# Patient Record
Sex: Female | Born: 1940 | Race: White | Hispanic: No | Smoking: Former smoker
Health system: Southern US, Community
[De-identification: ages and names within clinical notes are randomized; demographics above are authoritative.]

## PROBLEM LIST (undated history)

## (undated) DIAGNOSIS — H269 Unspecified cataract: Secondary | ICD-10-CM

## (undated) DIAGNOSIS — I10 Essential (primary) hypertension: Secondary | ICD-10-CM

## (undated) DIAGNOSIS — E559 Vitamin D deficiency, unspecified: Secondary | ICD-10-CM

## (undated) DIAGNOSIS — D126 Benign neoplasm of colon, unspecified: Secondary | ICD-10-CM

## (undated) DIAGNOSIS — I4891 Unspecified atrial fibrillation: Secondary | ICD-10-CM

## (undated) DIAGNOSIS — M810 Age-related osteoporosis without current pathological fracture: Secondary | ICD-10-CM

## (undated) DIAGNOSIS — B029 Zoster without complications: Secondary | ICD-10-CM

## (undated) DIAGNOSIS — E785 Hyperlipidemia, unspecified: Secondary | ICD-10-CM

## (undated) HISTORY — PX: EYE SURGERY: SHX253

## (undated) HISTORY — PX: BREAST EXCISIONAL BIOPSY: SUR124

## (undated) HISTORY — DX: Vitamin D deficiency, unspecified: E55.9

## (undated) HISTORY — DX: Essential (primary) hypertension: I10

## (undated) HISTORY — PX: LAPAROSCOPIC LYSIS OF ADHESIONS: SHX5905

## (undated) HISTORY — DX: Age-related osteoporosis without current pathological fracture: M81.0

## (undated) HISTORY — DX: Unspecified cataract: H26.9

## (undated) HISTORY — PX: TUBAL LIGATION: SHX77

## (undated) HISTORY — DX: Zoster without complications: B02.9

## (undated) HISTORY — DX: Benign neoplasm of colon, unspecified: D12.6

## (undated) HISTORY — DX: Hyperlipidemia, unspecified: E78.5

---

## 2005-02-21 DIAGNOSIS — D126 Benign neoplasm of colon, unspecified: Secondary | ICD-10-CM

## 2005-02-21 HISTORY — PX: PARTIAL COLECTOMY: SHX5273

## 2005-02-21 HISTORY — DX: Benign neoplasm of colon, unspecified: D12.6

## 2005-05-12 ENCOUNTER — Other Ambulatory Visit: Admission: RE | Admit: 2005-05-12 | Discharge: 2005-05-12 | Payer: Self-pay | Admitting: Family Medicine

## 2005-05-22 HISTORY — PX: COLONOSCOPY: SHX174

## 2005-05-26 ENCOUNTER — Ambulatory Visit: Payer: Self-pay | Admitting: Gastroenterology

## 2005-06-01 ENCOUNTER — Ambulatory Visit: Payer: Self-pay | Admitting: Gastroenterology

## 2005-06-01 ENCOUNTER — Encounter (INDEPENDENT_AMBULATORY_CARE_PROVIDER_SITE_OTHER): Payer: Self-pay | Admitting: *Deleted

## 2005-06-03 ENCOUNTER — Ambulatory Visit: Payer: Self-pay | Admitting: Internal Medicine

## 2005-06-13 ENCOUNTER — Ambulatory Visit: Payer: Self-pay

## 2005-07-19 ENCOUNTER — Encounter (INDEPENDENT_AMBULATORY_CARE_PROVIDER_SITE_OTHER): Payer: Self-pay | Admitting: *Deleted

## 2005-07-19 ENCOUNTER — Inpatient Hospital Stay (HOSPITAL_COMMUNITY): Admission: RE | Admit: 2005-07-19 | Discharge: 2005-07-22 | Payer: Self-pay | Admitting: Surgery

## 2005-11-30 ENCOUNTER — Ambulatory Visit (HOSPITAL_COMMUNITY): Admission: RE | Admit: 2005-11-30 | Discharge: 2005-11-30 | Payer: Self-pay | Admitting: Ophthalmology

## 2005-12-01 ENCOUNTER — Ambulatory Visit: Payer: Self-pay | Admitting: Gastroenterology

## 2005-12-28 ENCOUNTER — Ambulatory Visit (HOSPITAL_COMMUNITY): Admission: RE | Admit: 2005-12-28 | Discharge: 2005-12-28 | Payer: Self-pay | Admitting: Ophthalmology

## 2006-01-02 ENCOUNTER — Encounter (INDEPENDENT_AMBULATORY_CARE_PROVIDER_SITE_OTHER): Payer: Self-pay | Admitting: Specialist

## 2006-01-02 ENCOUNTER — Ambulatory Visit: Payer: Self-pay | Admitting: Gastroenterology

## 2006-01-02 HISTORY — PX: COLONOSCOPY: SHX174

## 2006-01-10 ENCOUNTER — Ambulatory Visit: Payer: Self-pay | Admitting: Gastroenterology

## 2006-02-09 ENCOUNTER — Ambulatory Visit: Payer: Self-pay | Admitting: Gastroenterology

## 2006-02-09 LAB — CONVERTED CEMR LAB: CEA: 3.4 ng/mL (ref 0.0–5.0)

## 2006-02-21 HISTORY — PX: COLONOSCOPY: SHX174

## 2006-02-21 HISTORY — PX: COLON RESECTION: SHX5231

## 2006-04-10 ENCOUNTER — Ambulatory Visit: Payer: Self-pay | Admitting: Gastroenterology

## 2006-04-19 ENCOUNTER — Ambulatory Visit: Payer: Self-pay | Admitting: Internal Medicine

## 2006-04-27 ENCOUNTER — Encounter (INDEPENDENT_AMBULATORY_CARE_PROVIDER_SITE_OTHER): Payer: Self-pay | Admitting: Specialist

## 2006-04-27 ENCOUNTER — Ambulatory Visit (HOSPITAL_COMMUNITY): Admission: RE | Admit: 2006-04-27 | Discharge: 2006-04-27 | Payer: Self-pay | Admitting: Internal Medicine

## 2006-05-02 ENCOUNTER — Ambulatory Visit: Payer: Self-pay | Admitting: Internal Medicine

## 2006-07-24 ENCOUNTER — Ambulatory Visit (HOSPITAL_COMMUNITY): Admission: RE | Admit: 2006-07-24 | Discharge: 2006-07-24 | Payer: Self-pay | Admitting: Internal Medicine

## 2006-07-24 ENCOUNTER — Encounter: Payer: Self-pay | Admitting: Internal Medicine

## 2006-07-28 ENCOUNTER — Ambulatory Visit: Payer: Self-pay | Admitting: Internal Medicine

## 2006-11-08 ENCOUNTER — Ambulatory Visit: Payer: Self-pay | Admitting: Internal Medicine

## 2006-11-14 ENCOUNTER — Encounter: Payer: Self-pay | Admitting: Internal Medicine

## 2006-11-14 ENCOUNTER — Ambulatory Visit (HOSPITAL_COMMUNITY): Admission: RE | Admit: 2006-11-14 | Discharge: 2006-11-14 | Payer: Self-pay | Admitting: Internal Medicine

## 2006-11-22 ENCOUNTER — Ambulatory Visit: Payer: Self-pay | Admitting: Internal Medicine

## 2006-12-12 ENCOUNTER — Ambulatory Visit: Payer: Self-pay | Admitting: Internal Medicine

## 2007-01-09 ENCOUNTER — Inpatient Hospital Stay (HOSPITAL_COMMUNITY): Admission: RE | Admit: 2007-01-09 | Discharge: 2007-01-13 | Payer: Self-pay | Admitting: Surgery

## 2007-01-09 ENCOUNTER — Encounter (INDEPENDENT_AMBULATORY_CARE_PROVIDER_SITE_OTHER): Payer: Self-pay | Admitting: Surgery

## 2008-01-01 ENCOUNTER — Ambulatory Visit: Payer: Self-pay | Admitting: Internal Medicine

## 2009-11-05 ENCOUNTER — Encounter (INDEPENDENT_AMBULATORY_CARE_PROVIDER_SITE_OTHER): Payer: Self-pay | Admitting: *Deleted

## 2010-03-23 NOTE — Letter (Signed)
Summary: Colonoscopy Letter  [facility]  [address] [place], [place] [address]   Phone: [phone]  Fax: [phone]      November 05, 2009 MRN: [id number]   Robin [name] [address] [place], [place]  [address]   Dear Ms. NICOTRA,   According to your medical record, it is time for you to schedule a Colonoscopy. The American Cancer Society recommends this procedure as a method to detect early colon cancer. Patients with a family history of colon cancer, or a personal history of colon polyps or inflammatory bowel disease are at increased risk.  This letter has beeen generated based on the recommendations made at the time of your procedure. If you feel that in your particular situation this may no longer apply, please contact our office.  Please call our office at [phone] to schedule this appointment or to update your records at your earliest convenience.  Thank you for cooperating with [place] to provide you with the very best care possible.   Sincerely,   Vania Rea. Jarold Motto, M.D.   [facility] [phone]

## 2010-07-06 NOTE — Assessment & Plan Note (Signed)
[facility]                            CARDIOLOGY OFFICE NOTE   Robin [name], Robin [name]                      MRN:          [id number]  DATE:01/01/2008                            DOB:          November 23, 1940    PRIMARY CARE PHYSICIAN:  Robin Peat, MD   INTERVAL HISTORY:  Robin [name] is a very pleasant 70 year old woman with  known history of coronary artery disease.  She has a history of anxiety  and hyperlipidemia as well as colon polyps.  She also has a remote  history of chest pain.  She had Myoview back in April 2007, which showed  an EF of 71% and no ischemia.  We saw her back in October of last year  for preop clearance prior to her colon resection.  At that time, she was  very active and was low risk from a surgical standpoint.   She returns today for followup.  She has done very well with her  surgeries without any cardiac complications.  She continues to walk  several days a week for almost 2 hours at a time without any chest pain  or dyspnea.   CURRENT MEDICATIONS:  1. Zetia 10 a day.  2. Simvastatin 80 a day.  3. Iron.  4. Aspirin 81 a day.  5. Vitamin D.  6. Caltrate.  7. Forteo for her bones.   PHYSICAL EXAMINATION:  GENERAL:  She is well-appearing, no acute  distress, ambulates in the clinic without respiratory difficulty.  VITAL SIGNS:  Blood pressure is 124/78, heart rate 62, weight is 114.  HEENT is normal.  NECK:  Supple.  There is no JVD.  Carotids are 2+ bilaterally without  any bruits.  There is no lymphadenopathy or thyromegaly.  CARDIAC:  PMI is normal.  She is regular rate and rhythm.  No murmurs,  rubs or gallops.  LUNGS:  Clear.  ABDOMEN:  Soft, nontender,  nondistended, no hepatosplenomegaly, no bruits, no masses.  Good bowel  sounds.  EXTREMITIES:  Warm with no cyanosis, clubbing, or edema.  SKIN:  No rash.  NEURO:  Alert and oriented x3.  Cranial nerves II through XII are  intact.  Moves all 4 extremities without  difficulty.  Affect is  pleasant.   EKG shows sinus arrhythmia with first-degree AV block at a PR interval  of 216 milliseconds.  No ST-T wave abnormalities, heart rate 62.   ASSESSMENT AND PLAN:  1. History of chest pain, this is resolved.  She has excellent      functional capacity with a normal Myoview.  She does not need      further cardiac followup at this time.  2. Hyperlipidemia.  This is followed by primary care physician.  Goal      LDL is less than 130.  However, optimally would be under 100.   DISPOSITION:  I will see her back on p.r.n. basis.     Robin Buckles. Bensimhon, MD  Electronically Signed    DRB/MedQ  DD: 01/01/2008  DT: 01/02/2008  Job #: [id number]   cc:   Robin [name], M.D.

## 2010-07-06 NOTE — Op Note (Signed)
NAMEABBYE, LAO               ACCOUNT NO.:  [id number]   MEDICAL RECORD NO.:  [id number]          PATIENT TYPE:  INP   LOCATION:  5731                         FACILITY:  [facility]   PHYSICIAN:  Wilmon Arms. Corliss Skains, M.D. 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   PROCEDURE PERFORMED:  Transverse colectomy.   SURGEON:  Wilmon Arms. Corliss Skains, M.D., FACS   ASSISTANT:  Currie Paris, M.D.   ANESTHESIA:  General endotracheal.   INDICATIONS:  The patient is a very pleasant 70 year old female who  underwent a laparoscopic hand assisted right hemicolectomy in May 2007  for tubulovillous adenomas.  The patient did extremely well.  At the  time of her resection, her margins were clear.  The patient has had  surveillance colonoscopy since that time by Dr. Leone Payor.  This showed a  new tubulovillous adenoma at the ileocolic anastomosis.  She is now  referred for resection of this area.   DESCRIPTION OF PROCEDURE:  The patient was brought to the operating room  and placed in the supine position on the operating table.  After an  adequate level of general anesthesia was obtained, a Foley catheter was  placed under sterile technique.  The patient's abdomen was prepped with  Betadine and draped in a sterile fashion.  A vertical midline incision  was made above the umbilicus.  We carried this down a couple of inches  below the umbilicus.  Dissection was carried down to the fascia which  was opened with cautery in the midline.  We entered the peritoneal  cavity.  The patient had some fairly dense omental adhesions to the  under surface of the previous lower midline incision.  We extended our  incision down and took the adhesions down.  We were able to identify the  transverse colon.  We followed this to the right towards the  anastomosis.  The small bowel seemed firmly adhered down into the right  lower quadrant.  It took [place] about 20 minutes of dissection to free up  the small bowel from the right lower quadrant.  Once this was  accomplished, we were able to visualize the entire anastomosis.  We  dissected this free with blunt dissection from the posterior lying  duodenum.  The terminal ileum as well as the transverse colon were then  divided with GIA staplers.  The LigaSure device was used to divide the  mesentery.  The specimen was then opened on the back table.  We were  able to identify the polyp at the previous anastomosis.  We thoroughly  irrigated the abdomen.  The anastomosis was then created with another  firing of the GIA-75 stapler.  The enterotomy was closed with a TA-60  stapler.  The mesenteric defect was closed with 2-0 silk sutures.  The  abdomen was thoroughly irrigated with saline.  We placed Seprafilm in  the right lower quadrant where  the patient previously had a lot of  adhesions.  We also placed some pieces of Seprafilm from the anterior  surface of the omentum just  behind the midline closure.  The fascia was then closed with double  stranded #1 PDS suture.  The subcutaneous tissues were irrigated. The  staples were used to close the skin.  The patient was extubated and  brought to the recovery room in stable condition.  All sponge,  instrument, and needle counts were correct.      Wilmon Arms. Tsuei, M.D.  Electronically Signed     MKT/MEDQ  D:  01/09/2007  T:  01/09/2007  Job:  [id number]   cc:   Iva Boop, MD,FACG

## 2010-07-06 NOTE — Assessment & Plan Note (Signed)
[facility] HEALTHCARE                            CARDIOLOGY OFFICE NOTE   Robin [name], Robin [name]                      MRN:          [id number]  DATE:12/12/2006                            DOB:          06-Jul-1940    REFERRING PHYSICIAN:  Wilmon Arms. Tsuei, M.D.   PRIMARY:  Lindaann Pascal, PA, at [facility].   GI DOCTOR:  Dr. Sheryn Bison.   REASON FOR CONSULTATION:  Preoperative cardiac risk stratification.   HISTORY OF PRESENT ILLNESS:  Robin [name] is a pleasant 70 year old woman  with no known history of coronary artery disease.  She does have a  history of anxiety and hyperlipidemia.  She was evaluated for chest pain  back in April 2007, at which time she underwent a Myoview, which showed  an ejection fraction of 71% and no evidence of ischemia on perfusion  imaging or exercise EKG.  Since that time, she has done very well.  She  denies any significant chest pain or shortness of breath.  She says she  walks 2 to 2-1/2 hours a day without any problem.  She has not had any  heart failure.  No palpitations.  No syncope.  No pre-syncope.  No focal  neurologic problems.   She was recently found to have large tubulovillous adenoma in her  transverse colon and is pending resection.  We were, thus, asked to  provide cardiac risk factor stratification prior to surgery.   REVIEW OF SYSTEMS:  As per the HPI and problem list.  Otherwise, all  systems negative.   PAST MEDICAL HISTORY:  1. Chest pain.      a.     Normal Myoview in April 2007.  2. Anxiety.  3. Hyperlipidemia.  4. Colonic tubulovillous adenoma.   CURRENT MEDICATIONS:  1. Aspirin 325.  2. Caltrate.  3. Actonel.  4. Zetia 10 a day.  5. Simvastatin 80 a day.  6. Iron tablets.   ALLERGIES:  No known drug allergies.   SOCIAL HISTORY:  She lives in [place].  She is widowed.  She has 6  children.  Denies any tobacco or alcohol.   FAMILY HISTORY:  She is 1 of 9 children.   Mother is alive and well at  age 58.  She had coronary artery disease and bypass surgery at 62.  Father died at age 83 due to COPD.  Three sisters, one who has an  irregular heartbeat, but no coronary artery disease.  Five brothers with  no coronary artery disease.   PHYSICAL EXAM:  She is well-appearing in no acute distress.  She  ambulates around the clinic without any respiratory difficulty.  Blood pressure 118/76, heart rate 63, weight 116.  HEENT:  Normal.  NECK:  Supple.  There is no JVD.  Carotids are 2+ bilaterally without  any bruits.  There is no lymphadenopathy or thyromegaly.  CARDIAC:  PMI is normal.  Regular rate and rhythm.  No murmurs, rubs, or  gallops.  LUNGS:  Clear.  ABDOMEN:  Soft, nontender, nondistended.  There is no  hepatosplenomegaly.  No bruits.  No  masses.  Good bowel sounds.  EXTREMITIES:  Warm with no cyanosis, clubbing, or edema.  Distal pulses  are strong.  NEUROLOGIC:  She is alert and oriented x3.  Cranial nerves 2-12 are  intact.  Moves all 4 extremities without difficulty.  Affect is  pleasant.   EKG shows sinus rhythm with a first degree AV block at 216 ms.  There  are no significant ST-T wave abnormalities.   ASSESSMENT AND PLAN:  1. Preoperative cardiac risk stratification.  Given her functional      capacity and recent stress testing, she is at low risk for      perioperative cardiac complications.  Proceed with surgery without      any further cardiac testing.  2. Hyperlipidemia.  Continue Zetia and simvastatin.  This is followed      by her primary care physician.  3. Chest pain.  This is resolved.  Previous stress test was normal.   DISPOSITION:  She can follow up on a p.r.n. basis.     Bevelyn Buckles. Bensimhon, MD  Electronically Signed    DRB/MedQ  DD: 12/12/2006  DT: 12/13/2006  Job #: [id number]   cc:   Wilmon Arms. Tsuei, M.D.  Vania Rea. Jarold Motto, MD, Caleen Essex, FAGA

## 2010-07-09 NOTE — Assessment & Plan Note (Signed)
[facility]                           GASTROENTEROLOGY OFFICE NOTE   Robin [name], Robin [name]                      MRN:          [id number]  DATE:12/01/2005                            DOB:          02-15-1941    HISTORY OF PRESENT ILLNESS:  Robin [name] is a 70 year old white female who  had Guaiac positive stools and underwent colonoscopy in May of this past  year. She was found to have a large villous adenoma in the right colon at  the area of the hepatic flexure. There were also multiple right colon  polyps. She underwent right hemicolectomies by Dr. Corliss Skains and has done well  since that time.   However, apparently she has had Guaiac positive stools in her primary care  physician's office and has had some mild progressive anemia. She was seen by  Dr. Lorin Picket long. She really denies any GI complaints or general medical  problems otherwise.  She says her appetite is good and her weight is stable.  She is having regular bowel movements and has not notice hematochezia or  melena.  She has undergone cardiac evaluation in our office because of  atypical chest pain and hyperlipidemia, and chronic anxiety syndrome.   CURRENT MEDICATIONS:  1. Simvastatin 40 mg daily.  2. Aspirin 325 mg daily.  3. Caltrate two daily.  4. Ciprofloxacin eye drops.  5. Ophthalmic solution eye drops for cataract surgery.   PHYSICAL EXAMINATION:  GENERAL:  She is a thin, elderly chronically ill-  appearing white female. She weighs 111 pounds which is her normal weight.  VITAL SIGNS:  Blood pressure is 140/70. Pulse 84 and regular.  ABDOMEN:  Exam showed no organomegaly, masses or tenderness.  RECTAL EXAM:  Inspection of the rectum was unremarkable. Rectal exam showed  no rectal masses or tenderness with normal colored stool that is markedly  Guaiac positive.   ASSESSMENT:  I am obviously concerned about Robin [name]'s continued Guaiac  positive stools despite her hemicolectomy. She  did have other colon polyps  that were excised at the time of her colonoscopy.  Review of the surgical  pathology shows no evidence of malignancy. As mentioned above, she is on  aspirin therapy but denies dyspepsia or reflux symptoms.   RECOMMENDATIONS:  1. I have started p.o. iron therapy.  2. I have started her on daily proton pump inhibitor therapy      prophylactically as she is on salicylates.  3. Outpatient endoscopy and colonoscopy exam.  4. Continue other medications as per primary care physician, Dr. Lindaann Pascal.       Vania Rea. Jarold Motto, MD, Clementeen Graham, [place]      DRP/MedQ  DD:  12/02/2005  DT:  12/03/2005  Job #:  [id number]   cc:   Wilmon Arms. Tsuei, M.D.  Scott Long

## 2010-07-09 NOTE — Discharge Summary (Signed)
NAMECHRISTELLA, APP               ACCOUNT NO.:  [id number]   MEDICAL RECORD NO.:  [id number]          PATIENT TYPE:  INP   LOCATION:  5710                         FACILITY:  [facility]   PHYSICIAN:  Wilmon Arms. Corliss Skains, M.D. 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   BRIEF HISTORY:  The patient is a 70 year old female who is 18 months  postop from a laparoscopic hand-assisted right hemicolectomy in May of  2007 for a tubulovillous adenoma.  At the time of her resection, her  margins were clear for tumor.  Since that time, she has had surveillance  colonoscopy.  This showed a new tubulovillous adenoma growing at the  ileocolic anastomosis.  Dr. Leone Payor was unable to resect this via  colonoscope.  She is now referred for resection of this area.   HOSPITAL COURSE:  The patient was admitted to the hospital after a bowel  prep.  She underwent an open transverse colectomy.  She had a stapled  side-to-side anastomosis.  The patient did well postoperatively.  She  had been started on Entereg preoperatively.  She had flatus on postop  day #2.  Her bowel function returned very quickly.  Her diet was  advanced.  The pathology confirmed only tubulovillous adenoma with no  sign of invasive cancer.  On postop day #4, the patient was discharged.  She was tolerating a regular diet and having bowel movements.  Her  wounds were all healing well.   DISCHARGE INSTRUCTIONS:  Follow up with Dr. Corliss Skains next week for staple  removal.  She was given p.r.n. Percocet.  She should refrain from any  heavy lifting.  She may take a regular diet.      Wilmon Arms. Tsuei, M.D.  Electronically Signed     MKT/MEDQ  D:  02/04/2007  T:  02/05/2007  Job:   [id number]

## 2010-07-09 NOTE — Op Note (Signed)
Robin [name], Robin [name]               ACCOUNT NO.:  [id number]   MEDICAL RECORD NO.:  [id number]          PATIENT TYPE:  AMB   LOCATION:  DAY                           FACILITY:  [facility]   PHYSICIAN:  Susanne Greenhouse, MD       DATE OF BIRTH:  1940/08/27   DATE OF PROCEDURE:  11/30/2005  DATE OF DISCHARGE:                                 OPERATIVE REPORT   PREOPERATIVE DIAGNOSIS:  Nuclear cataract, right eye.   POSTOPERATIVE DIAGNOSIS:  Nuclear cataract, right eye.   OPERATION PERFORMED:  Phacoemulsification posterior chamber intraocular lens  implantation, right eye.   SURGEON:  Gemma Payor, MD   ANESTHESIA:  Topical with monitored anesthesia care.   OPERATIVE SUMMARY:  In the preoperative area, dilating drops and 2% viscus  lidocaine were placed into the right eye.  The patient was then brought to  the operating room where the eye was prepped and draped.  A super-sharp  blade was used to make a paracentesis port at the surgeon's 2 o'clock  position.  The anterior chamber was filled with 1% nonpreserved lidocaine  solution followed by Amvisc Plus.  A 2.85 mm keratome blade was used to make  a clear corneal incision at the superotemporal limbus.  A cystotome needle  __________ forceps were used to create a continuous tear capsulotomy.  Hydrodissection was performed using balanced salt solution on a fine  cannula.  The lens nucleus was then removed using phacoemulsification with a  quadrant cracking technique.  Residual cortex was removed with irrigation  and aspiration.  The capsular bag and anterior chamber were refilled with  Amvisc Plus.  A posterior chamber intraocular lens was placed in the  capsular bag using a Monarch Lens Injecting System.  The Amvisc Plus was  then removed from the capsular bag and anterior chamber with irrigation and  aspiration.  Stromal hydration of the main incision and paracentesis ports  was performed with balanced salt solution and a fine cannula.  The wound  was  tested for a leak which was negative.  There were no operative  complications.  The patient tolerated the procedure well and was returned to  the recovery area in satisfactory condition.  Prosthetic device is an Alcon  AcrySof Posterior Chamber Intraocular Lens, model SN60WF, power of 22.0,  serial number [id number].           ______________________________  Susanne Greenhouse, MD     KEH/MEDQ  D:  11/30/2005  T:  12/01/2005  Job:  [id number]

## 2010-07-09 NOTE — Op Note (Signed)
NAMECLEOPHA, INDELICATO               ACCOUNT NO.:  [id number]   MEDICAL RECORD NO.:  [id number]          PATIENT TYPE:  INP   LOCATION:  X009                         [facility]ACILITY:  [facility]   PHYSICIAN:  Wilmon Arms. Corliss Skains, M.D. DATE OF BIRTH:  07/03/1940   DATE OF PROCEDURE:  07/19/2005  DATE OF DISCHARGE:                                 OPERATIVE REPORT   PREOPERATIVE DIAGNOSIS:  Tubulovillous adenoma, right colon.   POSTOPERATIVE DIAGNOSIS:  Tubulovillous adenoma, right colon.   PROCEDURE PERFORMED:  Laparoscopic assisted right hemicolectomy.   SURGEON:  Wilmon Arms. Corliss Skains, M.D.   ASSISTANTSharlet Salina T. Hoxworth, M.D.   ANESTHESIA:  General endotracheal.   INDICATIONS:  The patient is a 70 year old female who recently underwent a  routine physical examination.  Hemoccult cards were positive for occult  blood.  Dr. Jarold Motto then performed a colonoscopy on 06/01/2005.  This  showed what was described as a 5 cm circumferential fungating tumor at the  hepatic flexure.  Biopsy returned a diagnosis of tubulovillous adenoma.  The  patient had multiple other polyps throughout the ascending colon.  These  were not biopsied.  The patient was then referred for surgical evaluation.  We recommended a right hemicolectomy.  We will perform this using  laparoscopic method to hopefully shorten her hospital stay and discomfort.   DESCRIPTION OF PROCEDURE:  The patient brought to the operating placed in  supine position on operating table.  After adequate level of general  endotracheal anesthesia was obtained, a Foley catheter was placed under  sterile technique.  A time-out was then taken assure proper patient, proper  procedure.  A 6 cm incision was made beginning just below the umbilicus down  through the midline.  Dissection was carried down through the subcutaneous  tissues using Bovie cautery.  The fascia was opened along the linea alba.  The peritoneal cavity was bluntly entered.  There were no  adhesions of the  undersurface of the abdominal wall.  A small lap disk was then inserted.  My  left hand was then inserted through the iris of the lap disk.  With my left  hand protecting the viscera, two 5 mm ports were placed in the upper  abdomen.  One was located just inferior to the xiphoid process.  The other  was in the left upper quadrant.  Pneumoperitoneum was obtained by  insufflating CO2 maintaining maximal pressure 15 mmHg.  A 5 mm laparoscope  was inserted through the subxiphoid port.  The transverse and ascending  colon were then palpated.  Several soft masses were palpated near the  hepatic flexure.  The right colon was then mobilized with the Harmonic  scalpel and finger dissection.  We began at the terminal ileum and carried  our dissection superiorly.  This dissection continued around the hepatic  flexure along the transverse colon.  Once we had mobilized to the mid  transverse colon, pneumoperitoneum was then released.  The lap disk was  opened completely and the ascending colon was exteriorized.  We marked the  palpable mass with a silk suture.  The transverse colon  was transected with  a GIA 55 stapler just to the right of the middle colic artery.  The LigaSure  device was used take down the mesentery down to the terminal ileum.  Terminal ileum was divided approximately 10 cm from the ileocecal valve with  an additional firing of the GIA stapler.  A side-to-side staple anastomosis  was then created with a third firing of the GIA stapler.  The enterotomy was  closed with a TA 60 stapler.  A reinforcing suture of 2-0 silk was placed at  the crotch of the anastomosis.  The mesenteric defect closed with  interrupted figure-of-eight 2-0 silk sutures.  The specimen was passed off  the field.  The anastomosis was then placed back in the abdominal cavity.  We then reinspected the right pericolic gutter.  A small peritoneal vessel  was bleeding.  This was controlled with the  Harmonic scalpel.  The right  pericolic gutter was then again thoroughly irrigated.  No further bleeding  was noted.  The anastomosis was noted to be located in the right upper  quadrant.  The liver and gallbladder appeared to be normal.  Pneumoperitoneum was then released and the ports were removed.  The lap disk  was also removed.  The fascia was closed with a #1 PDS suture in running  fashion.  A 4-0 Monocryl was used to close skin in subcuticular fashion.  Steri-Strips and clean dressings were applied.  The patient was then  extubated and brought to recovery room in stable condition.  All sponge,  instrument and needle counts correct.      Wilmon Arms. Tsuei, M.D.  Electronically Signed     MKT/MEDQ  D:  07/19/2005  T:  07/19/2005  Job:  [id number]   cc:   Vania Rea. Jarold Motto, M.D. [facility]  [address]  [place]  [place] [address]

## 2010-07-09 NOTE — Discharge Summary (Signed)
[name], [name]               ACCOUNT NO.:  [id number]   MEDICAL RECORD NO.:  [id number]          PATIENT TYPE:  INP   LOCATION:  1517                         FACILITY:  [facility]   PHYSICIAN:  Wilmon Arms. Corliss Skains, M.D. DATE OF BIRTH:  May 25, 1940   DATE OF ADMISSION:  07/19/2005  DATE OF DISCHARGE:  07/22/2005                                 DISCHARGE SUMMARY   ADMISSION DIAGNOSIS:  Tubulovillous adenoma of the right colon.   DISCHARGE DIAGNOSIS:  Tubulovillous adenoma of the right colon.   PROCEDURES PERFORMED:  A laparoscopic hand-assisted right hemicolectomy on  Jul 19, 2005.   [name] is a 70 year old female in reasonably good health who presented  with positive fecal occult blood.  Dr. Jarold Motto performed a colonoscopy on  June 01, 2005 which showed a tumor of the hepatic flexure.  This was  biopsied and was shown to be tubulovillous adenoma.  Patient also has other  polyps throughout the ascending colon.  Patient was then referred for  surgical evaluation.  She was sent for cardiac clearance due to her cardiac  arrhythmia.  Once she was cleared she was scheduled for a laparoscopic hand-  assisted right hemicolectomy.  This was performed on Jul 19, 2005 after a  full bowel prep at home.  The patient tolerated the procedure well.  She had  bowel sounds on postoperative day #1 and was maintained on clear liquids.  Her diet was advanced and the patient had a bowel movement on postoperative  day #2.  She is being discharged home on postoperative day #3 on a regular  diet.  She is taking Percocet p.r.n. for pain.   DISCHARGE INSTRUCTIONS:  The patient is to follow up in the two to three  weeks.  She was given a prescription for Percocet.  She should resume all of  her preoperative medications.  We discussed her final pathology report prior  to her leaving the hospital.  She had a total of six polyps two of which  were tubulovillous adenoma.  The remainder were benign adenomatous  polyps.  There was no evidence of invasive cancer.      Wilmon Arms. Tsuei, M.D.  Electronically Signed     MKT/MEDQ  D:  07/22/2005  T:  07/22/2005  Job:  [id number]   cc:   Vania Rea. Jarold Motto, M.D. [facility]  [address]  [place]  [place] [address]

## 2010-07-09 NOTE — Assessment & Plan Note (Signed)
[facility]                           GASTROENTEROLOGY OFFICE NOTE   OLINA, MELFI                      MRN:          [id number]  DATE:01/10/2006                            DOB:          December 12, 1940    Because of her anemia and black positive stools, Mrs. Gaccione underwent  repeat colonoscopy on January 02, 2006.  This showed a large spreading  villous adenoma, several centimeters distal to her ileocolonic anastomosis.  There were also multiple polyps around her anastomosis of different size.  This large polyp was removed as best as possible in a piece meal manner and  tissue was sent to pathology for exam, which was reviewed and shows a  tubulovillous adenoma that did show focal igh grade dysplasia.  There was no  invasive carcinoma noted.  Review of her pathology from her surgery showed  tubulovillous adenomatous tissue without high grade dysplasia on those  specimens.   Since being on iron therapy, Yerania's hemoglobin has apparently risen to  11.8.  She denies any GI symptoms at this time.   She weighs 108 pounds.  Her blood pressure is 102/70.  Pulse was 68 and  regular.   General physical exam was not repeated at this time.   ASSESSMENT:  Mrs. Ziegler has a continued large villous adenoma at her  surgical site that was not resected at the time of her recent surgery.  This  probably should have been identified by [place] tattooing.  In any case,  we are now in a quandary as how to treat her since she does have some local  high grade dysplasia, may well have carcinoma on the base of this polyp.   RECOMMENDATIONS:  After talking with her and her sister, we will repeat her  colonoscopy in 3 months time at the hospital so that we could perform ERBE  laser therapy if indicated.  Of course at that time we will get further  tissue for pathologic exam and make a decision as to whether or not repeat  surgery is indicated, although she is  really not interested in pursuing this  course at this time.  I just her by the lab today to check liver profile and  a CEA level.  She is to continue on all of her other medications briefly  outlined on her chart, and she can restart her aspirin at this time.     Vania Rea. Jarold Motto, MD, Caleen Essex, FAGA  Electronically Signed    DRP/MedQ  DD: 01/10/2006  DT: 01/10/2006  Job #: [id number]   cc:   Wilmon Arms. Tsuei, M.D.  Lorin Picket, M.D. [facility]

## 2010-11-30 LAB — COMPREHENSIVE METABOLIC PANEL
ALT: 16
Alkaline Phosphatase: 67
BUN: 9
Calcium: 9.6
Creatinine, Ser: 0.75
Potassium: 4.2
Total Bilirubin: 0.9

## 2010-11-30 LAB — CBC
Hemoglobin: 12.1
MCHC: 33.4
MCV: 85.9
RBC: 4.2
RDW: 14.9
WBC: 7.1

## 2010-11-30 LAB — BASIC METABOLIC PANEL
BUN: 9
Calcium: 8.4
Chloride: 102
Creatinine, Ser: 0.91
GFR calc Af Amer: >60
Glucose, Bld: 165 — ABNORMAL HIGH
Potassium: 5.1

## 2010-11-30 LAB — DIFFERENTIAL
Basophils Absolute: 0
Eosinophils Relative: 1
Lymphs Abs: 1.7
Monocytes Absolute: 0.5
Monocytes Relative: 8
Neutro Abs: 3.2

## 2011-04-15 DIAGNOSIS — E559 Vitamin D deficiency, unspecified: Secondary | ICD-10-CM | POA: Diagnosis not present

## 2011-04-15 DIAGNOSIS — I10 Essential (primary) hypertension: Secondary | ICD-10-CM | POA: Diagnosis not present

## 2011-04-15 DIAGNOSIS — E039 Hypothyroidism, unspecified: Secondary | ICD-10-CM | POA: Diagnosis not present

## 2011-04-15 DIAGNOSIS — E782 Mixed hyperlipidemia: Secondary | ICD-10-CM | POA: Diagnosis not present

## 2011-06-16 DIAGNOSIS — R1033 Periumbilical pain: Secondary | ICD-10-CM | POA: Diagnosis not present

## 2011-06-16 DIAGNOSIS — R072 Precordial pain: Secondary | ICD-10-CM | POA: Diagnosis not present

## 2011-10-19 DIAGNOSIS — M810 Age-related osteoporosis without current pathological fracture: Secondary | ICD-10-CM | POA: Diagnosis not present

## 2011-11-08 ENCOUNTER — Encounter: Payer: Self-pay | Admitting: Gastroenterology

## 2011-12-02 DIAGNOSIS — Z23 Encounter for immunization: Secondary | ICD-10-CM | POA: Diagnosis not present

## 2012-01-25 DIAGNOSIS — H524 Presbyopia: Secondary | ICD-10-CM | POA: Diagnosis not present

## 2012-01-25 DIAGNOSIS — H52229 Regular astigmatism, unspecified eye: Secondary | ICD-10-CM | POA: Diagnosis not present

## 2012-01-25 DIAGNOSIS — H5200 Hypermetropia, unspecified eye: Secondary | ICD-10-CM | POA: Diagnosis not present

## 2012-01-25 DIAGNOSIS — H353190 Nonexudative age-related macular degeneration, unspecified eye, stage unspecified: Secondary | ICD-10-CM | POA: Diagnosis not present

## 2012-01-26 DIAGNOSIS — Z1231 Encounter for screening mammogram for malignant neoplasm of breast: Secondary | ICD-10-CM | POA: Diagnosis not present

## 2012-04-13 DIAGNOSIS — R7989 Other specified abnormal findings of blood chemistry: Secondary | ICD-10-CM | POA: Diagnosis not present

## 2012-04-13 DIAGNOSIS — E039 Hypothyroidism, unspecified: Secondary | ICD-10-CM | POA: Diagnosis not present

## 2012-04-13 DIAGNOSIS — I10 Essential (primary) hypertension: Secondary | ICD-10-CM | POA: Diagnosis not present

## 2012-04-13 DIAGNOSIS — E559 Vitamin D deficiency, unspecified: Secondary | ICD-10-CM | POA: Diagnosis not present

## 2012-04-13 DIAGNOSIS — E785 Hyperlipidemia, unspecified: Secondary | ICD-10-CM | POA: Diagnosis not present

## 2012-05-17 ENCOUNTER — Encounter: Payer: Self-pay | Admitting: *Deleted

## 2012-07-03 DIAGNOSIS — L259 Unspecified contact dermatitis, unspecified cause: Secondary | ICD-10-CM | POA: Diagnosis not present

## 2012-10-12 ENCOUNTER — Ambulatory Visit: Payer: Self-pay | Admitting: General Practice

## 2012-10-31 ENCOUNTER — Ambulatory Visit (INDEPENDENT_AMBULATORY_CARE_PROVIDER_SITE_OTHER): Payer: Medicare Other | Admitting: Pharmacist

## 2012-10-31 ENCOUNTER — Telehealth: Payer: Self-pay | Admitting: Pharmacist

## 2012-10-31 VITALS — BP 122/84 | HR 85 | Ht 62.0 in | Wt 114.0 lb

## 2012-10-31 DIAGNOSIS — E782 Mixed hyperlipidemia: Secondary | ICD-10-CM | POA: Insufficient documentation

## 2012-10-31 DIAGNOSIS — M810 Age-related osteoporosis without current pathological fracture: Secondary | ICD-10-CM

## 2012-10-31 DIAGNOSIS — E785 Hyperlipidemia, unspecified: Secondary | ICD-10-CM

## 2012-10-31 MED ORDER — DENOSUMAB 60 MG/ML ~~LOC~~ SOLN
60.0000 mg | Freq: Once | SUBCUTANEOUS | Status: AC
  Administered 2012-10-31: 60 mg via SUBCUTANEOUS
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
## 2012-10-31 NOTE — Telephone Encounter (Signed)
Spoke with patient.  Prolia injection due - she is coming in today for administration.

## 2012-10-31 NOTE — Patient Instructions (Addendum)

## 2012-12-05 ENCOUNTER — Encounter (INDEPENDENT_AMBULATORY_CARE_PROVIDER_SITE_OTHER): Payer: Self-pay

## 2012-12-05 ENCOUNTER — Ambulatory Visit (INDEPENDENT_AMBULATORY_CARE_PROVIDER_SITE_OTHER): Payer: Medicare Other | Admitting: Nurse Practitioner

## 2012-12-05 ENCOUNTER — Encounter: Payer: Self-pay | Admitting: Nurse Practitioner

## 2012-12-05 VITALS — BP 144/88 | HR 76 | Temp 98.1°F | Ht 62.0 in | Wt 120.0 lb

## 2012-12-05 DIAGNOSIS — Z1212 Encounter for screening for malignant neoplasm of rectum: Secondary | ICD-10-CM | POA: Diagnosis not present

## 2012-12-05 DIAGNOSIS — E785 Hyperlipidemia, unspecified: Secondary | ICD-10-CM | POA: Diagnosis not present

## 2012-12-05 DIAGNOSIS — Z23 Encounter for immunization: Secondary | ICD-10-CM | POA: Diagnosis not present

## 2012-12-05 DIAGNOSIS — E559 Vitamin D deficiency, unspecified: Secondary | ICD-10-CM

## 2012-12-05 MED ORDER — ATORVASTATIN CALCIUM 80 MG PO TABS: 80.0000 mg | ORAL_TABLET | Freq: Every day | ORAL | Status: DC

## 2012-12-05 NOTE — Patient Instructions (Signed)

## 2012-12-05 NOTE — Progress Notes (Signed)
  Subjective:    Patient ID: Robin [name], female    DOB: Oct 18, 1940, 72 y.o.   MRN: [id number]  Hyperlipidemia This is a chronic problem. The current episode started more than 1 year ago. The problem is controlled. Recent lipid tests were reviewed and are normal. She has no history of diabetes, hypothyroidism or obesity. There are no known factors aggravating her hyperlipidemia. Pertinent negatives include no chest pain, focal sensory loss, focal weakness, leg pain, myalgias or shortness of breath. Current antihyperlipidemic treatment includes statins. The current treatment provides moderate improvement of lipids. There are no compliance problems.  Risk factors for coronary artery disease include post-menopausal.  Vitamin D DEFICIENCY Vitamin D 2000 Iu OTC daily- tolerating well   Review of Systems  Constitutional: Negative for appetite change and unexpected weight change.  Respiratory: Negative for shortness of breath.   Cardiovascular: Negative for chest pain, palpitations and leg swelling.  Musculoskeletal: Negative for myalgias.  Neurological: Negative.  Negative for focal weakness.  All other systems reviewed and are negative.       Objective:   Physical Exam  Constitutional: She is oriented to person, place, and time. She appears well-developed and well-nourished.  HENT:  Nose: Nose normal.  Mouth/Throat: Oropharynx is clear and moist.  Eyes: EOM are normal.  Neck: Trachea normal, normal range of motion and full passive range of motion without pain. Neck supple. No JVD present. Carotid bruit is not present. No thyromegaly present.  Cardiovascular: Normal rate, regular rhythm, normal heart sounds and intact distal pulses.  Exam reveals no gallop and no friction rub.   No murmur heard. Pulmonary/Chest: Effort normal and breath sounds normal.  Abdominal: Soft. Bowel sounds are normal. She exhibits no distension and no mass. There is no tenderness.  Musculoskeletal: Normal range  of motion.  Lymphadenopathy:    She has no cervical adenopathy.  Neurological: She is alert and oriented to person, place, and time. She has normal reflexes.  Skin: Skin is warm and dry.  Psychiatric: She has a normal mood and affect. Her behavior is normal. Judgment and thought content normal.     BP 144/88  Pulse 76  Temp(Src) 98.1 F (36.7 C) (Oral)  Ht 5\' 2"  (1.575 m)  Wt 120 lb (54.432 kg)  BMI 21.94 kg/m2      Assessment & Plan:   1. Hyperlipidemia LDL goal < 100   2. Vitamin D deficiency    Orders Placed This Encounter  Procedures  . CMP14+EGFR  . NMR, lipoprofile   Meds ordered this encounter  Medications  . atorvastatin (LIPITOR) 80 MG tablet    Sig: Take 1 tablet (80 mg total) by mouth daily.    Dispense:  900 tablet    Refill:  1    Order Specific Question:  Supervising Provider    Answer:  Deborra Medina    Continue all meds Labs pending Diet and exercise encouraged Health maintenance reviewed Follow up in 3 months Flu shot and pneumonia vaccine today  Mary-Margaret Daphine Deutscher, FNP

## 2012-12-07 LAB — CMP14+EGFR
BUN/Creatinine Ratio: 12 (ref 11–26)
Chloride: 102 mmol/L (ref 97–108)
Creatinine, Ser: 0.93 mg/dL (ref 0.57–1.00)
GFR calc Af Amer: 72 mL/min/{1.73_m2} (ref >59)
Glucose: 88 mg/dL (ref 65–99)
Potassium: 4.5 mmol/L (ref 3.5–5.2)
Total Protein: 6.7 g/dL (ref 6.0–8.5)

## 2012-12-07 LAB — NMR, LIPOPROFILE
HDL Cholesterol by NMR: 49 mg/dL (ref >=40)
HDL Particle Number: 30.8 umol/L (ref >=30.5)
LDL Size: 21.2 nm (ref >20.5)
LP-IR Score: 39 (ref <=45)
Small LDL Particle Number: 481 nmol/L (ref <=527)

## 2013-01-24 DIAGNOSIS — H5200 Hypermetropia, unspecified eye: Secondary | ICD-10-CM | POA: Diagnosis not present

## 2013-01-24 DIAGNOSIS — Z961 Presence of intraocular lens: Secondary | ICD-10-CM | POA: Diagnosis not present

## 2013-01-24 DIAGNOSIS — H353190 Nonexudative age-related macular degeneration, unspecified eye, stage unspecified: Secondary | ICD-10-CM | POA: Diagnosis not present

## 2013-01-24 DIAGNOSIS — H35379 Puckering of macula, unspecified eye: Secondary | ICD-10-CM | POA: Diagnosis not present

## 2013-03-19 ENCOUNTER — Telehealth: Payer: Self-pay | Admitting: *Deleted

## 2013-03-19 DIAGNOSIS — M810 Age-related osteoporosis without current pathological fracture: Secondary | ICD-10-CM

## 2013-03-19 DIAGNOSIS — Z13820 Encounter for screening for osteoporosis: Secondary | ICD-10-CM

## 2013-03-19 NOTE — Telephone Encounter (Signed)
Patient needs referral for dexa scan is this ok?

## 2013-03-19 NOTE — Telephone Encounter (Signed)
Ordered fixed and sent in

## 2013-03-19 NOTE — Telephone Encounter (Signed)
Cannot order without patient signing waiver- medicare may not cover- thsy usually do but need to have waiver signed in order to place order.

## 2013-04-17 ENCOUNTER — Encounter: Payer: Self-pay | Admitting: Pharmacist

## 2013-04-17 ENCOUNTER — Ambulatory Visit (INDEPENDENT_AMBULATORY_CARE_PROVIDER_SITE_OTHER): Payer: Medicare Other | Admitting: Pharmacist

## 2013-04-17 ENCOUNTER — Ambulatory Visit (INDEPENDENT_AMBULATORY_CARE_PROVIDER_SITE_OTHER): Payer: Medicare Other

## 2013-04-17 VITALS — Ht 62.0 in | Wt 121.0 lb

## 2013-04-17 DIAGNOSIS — M810 Age-related osteoporosis without current pathological fracture: Secondary | ICD-10-CM
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
## 2013-04-17 NOTE — Patient Instructions (Signed)

## 2013-05-01 ENCOUNTER — Ambulatory Visit (INDEPENDENT_AMBULATORY_CARE_PROVIDER_SITE_OTHER): Payer: Medicare Other | Admitting: Pharmacist

## 2013-05-01 ENCOUNTER — Encounter: Payer: Self-pay | Admitting: Pharmacist

## 2013-05-01 VITALS — BP 136/80 | HR 72 | Ht 62.0 in | Wt 120.0 lb

## 2013-05-01 DIAGNOSIS — Z0000 Encounter for general adult medical examination without abnormal findings: Secondary | ICD-10-CM | POA: Diagnosis not present

## 2013-05-01 DIAGNOSIS — M810 Age-related osteoporosis without current pathological fracture: Secondary | ICD-10-CM

## 2013-05-01 MED ORDER — DENOSUMAB 60 MG/ML ~~LOC~~ SOLN: 60.0000 mg | SUBCUTANEOUS | Status: DC

## 2013-05-01 NOTE — Patient Instructions (Addendum)
Health Maintenance Summary    MAMMOGRAM Overdue Due 01/25/2013  Last done 01/26/2012    ZOSTAVAX Postponed 06/05/2013 Due now - cost verified 05/01/13 was $3.60 but may change    TETANUS/TDAP Postponed 06/05/2013 Due now - cost verified 05/01/13 was $3.60 but may change    INFLUENZA VACCINE Next Due 09/21/2013  Last done 12/05/2012    COLON CANCER SCREENING ANNUAL FOBT Next Due 12/05/2013  Last done 11/2012   DEXA / bone density Next Due 10/2013 Last done 10/16/2011   Pneumonia vaccine complete  Last done 2014    COLONOSCOPY Next Due 07/22/2016  Last done 07/23/2006         Preventive Care for Adults, Female A healthy lifestyle and preventive care can promote health and wellness. Preventive health guidelines for women include the following key practices.  A routine yearly physical is a good way to check with your health care provider about your health and preventive screening. It is a chance to share any concerns and updates on your health and to receive a thorough exam.  Visit your dentist for a routine exam and preventive care every 6 months. Brush your teeth twice a day and floss once a day. Good oral hygiene prevents tooth decay and gum disease.  The frequency of eye exams is based on your age, health, family medical history, use of contact lenses, and other factors. Follow your health care provider's recommendations for frequency of eye exams.  Eat a healthy diet. Foods like vegetables, fruits, whole grains, low-fat dairy products, and lean protein foods contain the nutrients you need without too many calories. Decrease your intake of foods high in solid fats, added sugars, and salt. Eat the right amount of calories for you.Get information about a proper diet from your health care provider, if necessary.  Regular physical exercise is one of the most important things you can do for your health. Most adults should get at least 150 minutes of moderate-intensity exercise (any activity that  increases your heart rate and causes you to sweat) each week. In addition, most adults need muscle-strengthening exercises on 2 or more days a week.  Maintain a healthy weight. The body mass index (BMI) is a screening tool to identify possible weight problems. It provides an estimate of body fat based on height and weight. Your health care provider can find your BMI, and can help you achieve or maintain a healthy weight.For adults 20 years and older:  A BMI below 18.5 is considered underweight.  A BMI of 18.5 to 24.9 is normal.  A BMI of 25 to 29.9 is considered overweight.  A BMI of 30 and above is considered obese.  Maintain normal blood lipids and cholesterol levels by exercising and minimizing your intake of saturated fat. Eat a balanced diet with plenty of fruit and vegetables. Blood tests for lipids and cholesterol should begin at age 1 and be repeated every 5 years. If your lipid or cholesterol levels are high, you are over 50, or you are at high risk for heart disease, you may need your cholesterol levels checked more frequently.Ongoing high lipid and cholesterol levels should be treated with medicines if diet and exercise are not working.  If you smoke, find out from your health care provider how to quit. If you do not use tobacco, do not start.  Lung cancer screening is recommended for adults aged 94 80 years who are at high risk for developing lung cancer because of a history of smoking. A yearly  low-dose CT scan of the lungs is recommended for people who have at least a 30-pack-year history of smoking and are a current smoker or have quit within the past 15 years. A pack year of smoking is smoking an average of 1 pack of cigarettes a day for 1 year (for example: 1 pack a day for 30 years or 2 packs a day for 15 years). Yearly screening should continue until the smoker has stopped smoking for at least 15 years. Yearly screening should be stopped for people who develop a health problem  that would prevent them from having lung cancer treatment.  If you are pregnant, do not drink alcohol. If you are breastfeeding, be very cautious about drinking alcohol. If you are not pregnant and choose to drink alcohol, do not have more than 1 drink per day. One drink is considered to be 12 ounces (355 mL) of beer, 5 ounces (148 mL) of wine, or 1.5 ounces (44 mL) of liquor.  Avoid use of street drugs. Do not share needles with anyone. Ask for help if you need support or instructions about stopping the use of drugs.  High blood pressure causes heart disease and increases the risk of stroke. Your blood pressure should be checked at least every 1 to 2 years. Ongoing high blood pressure should be treated with medicines if weight loss and exercise do not work.  If you are 26 72 years old, ask your health care provider if you should take aspirin to prevent strokes.  Diabetes screening involves taking a blood sample to check your fasting blood sugar level. This should be done once every 3 years, after age 66, if you are within normal weight and without risk factors for diabetes. Testing should be considered at a younger age or be carried out more frequently if you are overweight and have at least 1 risk factor for diabetes.  Breast cancer screening is essential preventive care for women. You should practice "breast self-awareness." This means understanding the normal appearance and feel of your breasts and may include breast self-examination. Any changes detected, no matter how small, should be reported to a health care provider. Women in their 23s and 30s should have a clinical breast exam (CBE) by a health care provider as part of a regular health exam every 1 to 3 years. After age 15, women should have a CBE every year. Starting at age 42, women should consider having a mammogram (breast X-ray test) every year. Women who have a family history of breast cancer should talk to their health care provider  about genetic screening. Women at a high risk of breast cancer should talk to their health care providers about having an MRI and a mammogram every year.  Breast cancer gene (BRCA)-related cancer risk assessment is recommended for women who have family members with BRCA-related cancers. BRCA-related cancers include breast, ovarian, tubal, and peritoneal cancers. Having family members with these cancers may be associated with an increased risk for harmful changes (mutations) in the breast cancer genes BRCA1 and BRCA2. Results of the assessment will determine the need for genetic counseling and BRCA1 and BRCA2 testing.  The Pap test is a screening test for cervical cancer. A Pap test can show cell changes on the cervix that might become cervical cancer if left untreated. A Pap test is a procedure in which cells are obtained and examined from the lower end of the uterus (cervix).  Women should have a Pap test starting at age 51.  Between ages 35 and 23, Pap tests should be repeated every 2 years.  Beginning at age 62, you should have a Pap test every 3 years as long as the past 3 Pap tests have been normal.  Some women have medical problems that increase the chance of getting cervical cancer. Talk to your health care provider about these problems. It is especially important to talk to your health care provider if a new problem develops soon after your last Pap test. In these cases, your health care provider may recommend more frequent screening and Pap tests.  The above recommendations are the same for women who have or have not gotten the vaccine for human papillomavirus (HPV).  If you had a hysterectomy for a problem that was not cancer or a condition that could lead to cancer, then you no longer need Pap tests. Even if you no longer need a Pap test, a regular exam is a good idea to make sure no other problems are starting.  If you are between ages 7 and 70 years, and you have had normal Pap tests  going back 10 years, you no longer need Pap tests. Even if you no longer need a Pap test, a regular exam is a good idea to make sure no other problems are starting.  If you have had past treatment for cervical cancer or a condition that could lead to cancer, you need Pap tests and screening for cancer for at least 20 years after your treatment.  If Pap tests have been discontinued, risk factors (such as a new sexual partner) need to be reassessed to determine if screening should be resumed.  The HPV test is an additional test that may be used for cervical cancer screening. The HPV test looks for the virus that can cause the cell changes on the cervix. The cells collected during the Pap test can be tested for HPV. The HPV test could be used to screen women aged 54 years and older, and should be used in women of any age who have unclear Pap test results. After the age of 14, women should have HPV testing at the same frequency as a Pap test.  Colorectal cancer can be detected and often prevented. Most routine colorectal cancer screening begins at the age of 45 years and continues through age 74 years. However, your health care provider may recommend screening at an earlier age if you have risk factors for colon cancer. On a yearly basis, your health care provider may provide home test kits to check for hidden blood in the stool. Use of a small camera at the end of a tube, to directly examine the colon (sigmoidoscopy or colonoscopy), can detect the earliest forms of colorectal cancer. Talk to your health care provider about this at age 50, when routine screening begins. Direct exam of the colon should be repeated every 5 10 years through age 18 years, unless early forms of pre-cancerous polyps or small growths are found.  People who are at an increased risk for hepatitis B should be screened for this virus. You are considered at high risk for hepatitis B if:  You were born in a country where hepatitis B  occurs often. Talk with your health care provider about which countries are considered high risk.  Your parents were born in a high-risk country and you have not received a shot to protect against hepatitis B (hepatitis B vaccine).  You have HIV or AIDS.  You use needles to inject  street drugs.  You live with, or have sex with, someone who has Hepatitis B.  You get hemodialysis treatment.  You take certain medicines for conditions like cancer, organ transplantation, and autoimmune conditions.  Hepatitis C blood testing is recommended for all people born from [birth year] through 1964 and any individual with known risks for hepatitis C.  Practice safe sex. Use condoms and avoid high-risk sexual practices to reduce the spread of sexually transmitted infections (STIs). STIs include gonorrhea, chlamydia, syphilis, trichomonas, herpes, HPV, and human immunodeficiency virus (HIV). Herpes, HIV, and HPV are viral illnesses that have no cure. They can result in disability, cancer, and death. Sexually active women aged 53 years and younger should be checked for chlamydia. Older women with new or multiple partners should also be tested for chlamydia. Testing for other STIs is recommended if you are sexually active and at increased risk.  Osteoporosis is a disease in which the bones lose minerals and strength with aging. This can result in serious bone fractures or breaks. The risk of osteoporosis can be identified using a bone density scan. Women ages 36 years and over and women at risk for fractures or osteoporosis should discuss screening with their health care providers. Ask your health care provider whether you should take a calcium supplement or vitamin D to reduce the rate of osteoporosis.  Menopause can be associated with physical symptoms and risks. Hormone replacement therapy is available to decrease symptoms and risks. You should talk to your health care provider about whether hormone replacement therapy  is right for you.  Use sunscreen. Apply sunscreen liberally and repeatedly throughout the day. You should seek shade when your shadow is shorter than you. Protect yourself by wearing long sleeves, pants, a wide-brimmed hat, and sunglasses year round, whenever you are outdoors.  Once a month, do a whole body skin exam, using a mirror to look at the skin on your back. Tell your health care provider of new moles, moles that have irregular borders, moles that are larger than a pencil eraser, or moles that have changed in shape or color.  Stay current with required vaccines (immunizations).  Influenza vaccine. All adults should be immunized every year.  Tetanus, diphtheria, and acellular pertussis (Td, Tdap) vaccine. Pregnant women should receive 1 dose of Tdap vaccine during each pregnancy. The dose should be obtained regardless of the length of time since the last dose. Immunization is preferred during the 27th 36th week of gestation. An adult who has not previously received Tdap or who does not know her vaccine status should receive 1 dose of Tdap. This initial dose should be followed by tetanus and diphtheria toxoids (Td) booster doses every 10 years. Adults with an unknown or incomplete history of completing a 3-dose immunization series with Td-containing vaccines should begin or complete a primary immunization series including a Tdap dose. Adults should receive a Td booster every 10 years.  Varicella vaccine. An adult without evidence of immunity to varicella should receive 2 doses or a second dose if she has previously received 1 dose. Pregnant females who do not have evidence of immunity should receive the first dose after pregnancy. This first dose should be obtained before leaving the health care facility. The second dose should be obtained 4 8 weeks after the first dose.  Human papillomavirus (HPV) vaccine. Females aged 57 26 years who have not received the vaccine previously should obtain the  3-dose series. The vaccine is not recommended for use in pregnant females. However, pregnancy  testing is not needed before receiving a dose. If a female is found to be pregnant after receiving a dose, no treatment is needed. In that case, the remaining doses should be delayed until after the pregnancy. Immunization is recommended for any person with an immunocompromised condition through the age of 30 years if she did not get any or all doses earlier. During the 3-dose series, the second dose should be obtained 4 8 weeks after the first dose. The third dose should be obtained 24 weeks after the first dose and 16 weeks after the second dose.  Zoster vaccine. One dose is recommended for adults aged 28 years or older unless certain conditions are present.  Measles, mumps, and rubella (MMR) vaccine. Adults born before [birth year] generally are considered immune to measles and mumps. Adults born in [birth year] or later should have 1 or more doses of MMR vaccine unless there is a contraindication to the vaccine or there is laboratory evidence of immunity to each of the three diseases. A routine second dose of MMR vaccine should be obtained at least 28 days after the first dose for students attending postsecondary schools, health care workers, or international travelers. People who received inactivated measles vaccine or an unknown type of measles vaccine during 1963 1967 should receive 2 doses of MMR vaccine. People who received inactivated mumps vaccine or an unknown type of mumps vaccine before 1979 and are at high risk for mumps infection should consider immunization with 2 doses of MMR vaccine. For females of childbearing age, rubella immunity should be determined. If there is no evidence of immunity, females who are not pregnant should be vaccinated. If there is no evidence of immunity, females who are pregnant should delay immunization until after pregnancy. Unvaccinated health care workers born before [birth year] who lack  laboratory evidence of measles, mumps, or rubella immunity or laboratory confirmation of disease should consider measles and mumps immunization with 2 doses of MMR vaccine or rubella immunization with 1 dose of MMR vaccine.  Pneumococcal 13-valent conjugate (PCV13) vaccine. When indicated, a person who is uncertain of her immunization history and has no record of immunization should receive the PCV13 vaccine. An adult aged 10 years or older who has certain medical conditions and has not been previously immunized should receive 1 dose of PCV13 vaccine. This PCV13 should be followed with a dose of pneumococcal polysaccharide (PPSV23) vaccine. The PPSV23 vaccine dose should be obtained at least 8 weeks after the dose of PCV13 vaccine. An adult aged 69 years or older who has certain medical conditions and previously received 1 or more doses of PPSV23 vaccine should receive 1 dose of PCV13. The PCV13 vaccine dose should be obtained 1 or more years after the last PPSV23 vaccine dose.  Pneumococcal polysaccharide (PPSV23) vaccine. When PCV13 is also indicated, PCV13 should be obtained first. All adults aged 29 years and older should be immunized. An adult younger than age 98 years who has certain medical conditions should be immunized. Any person who resides in a nursing home or long-term care facility should be immunized. An adult smoker should be immunized. People with an immunocompromised condition and certain other conditions should receive both PCV13 and PPSV23 vaccines. People with human immunodeficiency virus (HIV) infection should be immunized as soon as possible after diagnosis. Immunization during chemotherapy or radiation therapy should be avoided. Routine use of PPSV23 vaccine is not recommended for American Indians, [place] Natives, or people younger than 65 years unless there are medical conditions that require PPSV23  vaccine. When indicated, people who have unknown immunization and have no record of  immunization should receive PPSV23 vaccine. One-time revaccination 5 years after the first dose of PPSV23 is recommended for people aged 42 64 years who have chronic kidney failure, nephrotic syndrome, asplenia, or immunocompromised conditions. People who received 1 2 doses of PPSV23 before age 63 years should receive another dose of PPSV23 vaccine at age 57 years or later if at least 5 years have passed since the previous dose. Doses of PPSV23 are not needed for people immunized with PPSV23 at or after age 66 years.  Hepatitis A vaccine. Adults who wish to be protected from this disease, have certain high-risk conditions, work with hepatitis A-infected animals, work in hepatitis A research labs, or travel to or work in countries with a high rate of hepatitis A should be immunized. Adults who were previously unvaccinated and who anticipate close contact with an international adoptee during the first 60 days after arrival in the [place] from a country with a high rate of hepatitis A should be immunized.  Hepatitis B vaccine. Adults who wish to be protected from this disease, have certain high-risk conditions, may be exposed to blood or other infectious body fluids, are household contacts or sex partners of hepatitis B positive people, are clients or workers in certain care facilities, or travel to or work in countries with a high rate of hepatitis B should be immunized.

## 2013-05-01 NOTE — Progress Notes (Signed)
Subjective:    Robin [name] is a 73 y.o. female who presents for Medicare Initial preventive examination.  Preventive Screening-Counseling & Management  Tobacco History  Smoking status  . Former Smoker -- 1 years  . Types: Cigarettes  . Quit date: 11/01/1959  Smokeless tobacco  . Never Used     Current Problems (verified) Patient Active Problem List   Diagnosis Date Noted  . Osteoporosis 10/31/2012  . Hyperlipidemia 10/31/2012    Medications Prior to Visit Current Outpatient Prescriptions on File Prior to Visit  Medication Sig Dispense Refill  . aspirin 81 MG tablet Take 81 mg by mouth daily.      Marland Kitchen atorvastatin (LIPITOR) 80 MG tablet Take 1 tablet (80 mg total) by mouth daily.  900 tablet  1  . Calcium-Vitamin D (CALTRATE 600 PLUS-VIT D PO) Take 1 tablet by mouth daily.      . Cholecalciferol (VITAMIN D) 2000 UNITS tablet Take 2,000 Units by mouth daily.      Marland Kitchen denosumab (PROLIA) 60 MG/ML SOLN injection Inject 60 mg into the skin every 6 (six) months. Administer in upper arm, thigh, or abdomen       No current facility-administered medications on file prior to visit.    Current Medications (verified) Current Outpatient Prescriptions  Medication Sig Dispense Refill  . aspirin 81 MG tablet Take 81 mg by mouth daily.      Marland Kitchen atorvastatin (LIPITOR) 80 MG tablet Take 1 tablet (80 mg total) by mouth daily.  900 tablet  1  . Calcium-Vitamin D (CALTRATE 600 PLUS-VIT D PO) Take 1 tablet by mouth daily.      . Cholecalciferol (VITAMIN D) 2000 UNITS tablet Take 2,000 Units by mouth daily.      Marland Kitchen denosumab (PROLIA) 60 MG/ML SOLN injection Inject 60 mg into the skin every 6 (six) months. Administer in upper arm, thigh, or abdomen       No current facility-administered medications for this visit.     Allergies (verified) Review of patient's allergies indicates no known allergies.   PAST HISTORY  Family History Family History  Problem Relation Age of Onset  . Diabetes  Mother   . Heart disease Mother   . Cancer Mother     patient unsure of type  . Hip fracture Mother   . Heart disease Father     antigioplasty  . Cancer Sister     lung  . COPD Sister   . Heart disease Sister   . Atrial fibrillation Sister   . Heart disease Sister     stent in left leg  . Varicose Veins Sister     Social History History  Substance Use Topics  . Smoking status: Former Smoker -- 1 years    Types: Cigarettes    Quit date: 11/01/1959  . Smokeless tobacco: Never Used  . Alcohol Use: Not on file     Are there smokers in your home (other than you)? No  Risk Factors Current exercise habits: Home exercise routine includes walking 0.5 hrs per day.  Dietary issues discussed: none   Cardiac risk factors: advanced age (older than 76 for men, 54 for women), dyslipidemia and family history of premature cardiovascular disease.  Depression Screen (Note: if answer to either of the following is "Yes", a more complete depression screening is indicated)   Over the past 2 weeks, have you felt down, depressed or hopeless? No  Over the past 2 weeks, have you felt little interest or pleasure in  doing things? No  Have you lost interest or pleasure in daily life? No  Do you often feel hopeless? No  Do you cry easily over simple problems? No  Activities of Daily Living In your present state of health, do you have any difficulty performing the following activities?:  Driving? No Managing money?  No Feeding yourself? No Getting from bed to chair? No  Climbing a flight of stairs? No Preparing food and eating?: No Bathing or showering? No Getting dressed: No Getting to the toilet? No Using the toilet:No Moving around from place to place: No In the past year have you fallen or had a near fall?:No   Are you sexually active?  No  Do you have more than one partner?  No  Hearing Difficulties: No Do you often ask people to speak up or repeat themselves? No Do you experience  ringing or noises in your ears? No Do you have difficulty understanding soft or whispered voices? No   Do you feel that you have a problem with memory? No  Do you often misplace items? No  Do you feel safe at home?  Yes  Cognitive Testing  Alert? Yes   Normal Appearance?Yes  Oriented to person? Yes  Place? Yes   Time? Yes  Recall of three objects?  Yes  Can perform simple calculations? Yes  Displays appropriate judgment?Yes  Can read the correct time from a watch face?Yes   Advanced Directives have been discussed with the patient? Yes  List the Names of Other Physician/Practitioners you currently use: 1.  [place], OD  Indicate any recent Medical Services you may have received from other than [facility] providers in the past year (date may be approximate).  Immunization History  Administered Date(s) Administered  . Influenza,inj,Quad PF,36+ Mos 12/05/2012  . Pneumococcal Polysaccharide-23 12/05/2012    Screening Tests Health Maintenance  Topic Date Due  . Mammogram  09/21/1991  . Zostavax  06/05/2013 (Originally 09/20/2001)  . Tetanus/tdap  06/05/2013 (Originally 09/20/1960)  . Influenza Vaccine  09/21/2013  . Colon Cancer Screening Annual Fobt  12/05/2013  . Colonoscopy  07/22/2016  . Pneumococcal Polysaccharide Vaccine Age 81 And Over  Completed   ** per patient glaucoma screening performed by Dr Mayford Knife at last visit 03/2013**  All answers were reviewed with the patient and necessary referrals were made:  Cherre Robins, [facility]   05/01/2013   History reviewed: allergies, current medications, past family history, past medical history, past social history, past surgical history and problem list   Objective:      Body mass index is 21.94 kg/(m^2). BP 136/80  Pulse 72  Ht 5\' 2"  (1.575 m)  Wt 120 lb (54.432 kg)  BMI 21.94 kg/m2       Assessment:     Medicare Annual Wellness Visit      Plan:     During the course of the visit the  patient was educated and counseled about appropriate screening and preventive services including:    Pneumococcal vaccine   Influenza vaccine  Hepatitis B vaccine  Td vaccine  Screening mammography  Screening Pap smear and pelvic exam   Bone densitometry screening  Colorectal cancer screening  Diabetes screening  Glaucoma screening  Nutrition counseling   Advanced directives: patient given caring connections information  Cost of Zostavax and Tdap / Boostix verified - both would be $3.60 each if given today.  Patient declined today but till think about over next 3 months.  Referral to get  mammogram made Referral to have Dexa recheck 10/2013.  Diet review for nutrition referral?   Not Indicated   Patient Instructions (the written plan) was given to the patient.  Medicare Attestation I have personally reviewed: The patient's medical and social history Their use of alcohol, tobacco or illicit drugs Their current medications and supplements The patient's functional ability including ADLs,fall risks, home safety risks, cognitive, and hearing and visual impairment Diet and physical activities Evidence for depression or mood disorders  The patient's weight, height, BMI, and visual acuity have been recorded in the chart.  I have made referrals, counseling, and provided education to the patient based on review of the above and I have provided the patient with a written personalized care plan for preventive services.     Cherre Robins, [facility]   05/01/2013

## 2013-05-01 NOTE — Addendum Note (Signed)
Addended by: Cherre Robins on: 05/01/2013 03:14 PM   Modules accepted: Orders

## 2013-05-02 ENCOUNTER — Ambulatory Visit (INDEPENDENT_AMBULATORY_CARE_PROVIDER_SITE_OTHER): Payer: Medicare Other | Admitting: *Deleted

## 2013-05-02 DIAGNOSIS — M810 Age-related osteoporosis without current pathological fracture: Secondary | ICD-10-CM

## 2013-05-02 MED ORDER — DENOSUMAB 60 MG/ML ~~LOC~~ SOLN
60.0000 mg | Freq: Once | SUBCUTANEOUS | Status: AC
  Administered 2013-05-02: 60 mg via SUBCUTANEOUS

## 2013-05-02 NOTE — Patient Instructions (Signed)
Denosumab injection  What is this medicine?  DENOSUMAB (den oh sue mab) slows bone breakdown. Prolia is used to treat osteoporosis in women after menopause and in men. Xgeva is used to prevent bone fractures and other bone problems caused by cancer bone metastases. Xgeva is also used to treat giant cell tumor of the bone.  This medicine may be used for other purposes; ask your health care provider or pharmacist if you have questions.  COMMON BRAND NAME(S): Prolia, XGEVA  What should I tell my health care provider before I take this medicine?  They need to know if you have any of these conditions:  -dental disease  -eczema  -infection or history of infections  -kidney disease or on dialysis  -low blood calcium or vitamin D  -malabsorption syndrome  -scheduled to have surgery or tooth extraction  -taking medicine that contains denosumab  -thyroid or parathyroid disease  -an unusual reaction to denosumab, other medicines, foods, dyes, or preservatives  -pregnant or trying to get pregnant  -breast-feeding  How should I use this medicine?  This medicine is for injection under the skin. It is given by a health care professional in a hospital or clinic setting.  If you are getting Prolia, a special MedGuide will be given to you by the pharmacist with each prescription and refill. Be sure to read this information carefully each time.  For Prolia, talk to your pediatrician regarding the use of this medicine in children. Special care may be needed. For Xgeva, talk to your pediatrician regarding the use of this medicine in children. While this drug may be prescribed for children as young as 13 years for selected conditions, precautions do apply.  Overdosage: If you think you've taken too much of this medicine contact a poison control center or emergency room at once.  Overdosage: If you think you have taken too much of this medicine contact a poison control center or emergency room at once.  NOTE: This medicine is only for  you. Do not share this medicine with others.  What if I miss a dose?  It is important not to miss your dose. Call your doctor or health care professional if you are unable to keep an appointment.  What may interact with this medicine?  Do not take this medicine with any of the following medications:  -other medicines containing denosumab  This medicine may also interact with the following medications:  -medicines that suppress the immune system  -medicines that treat cancer  -steroid medicines like prednisone or cortisone  This list may not describe all possible interactions. Give your health care provider a list of all the medicines, herbs, non-prescription drugs, or dietary supplements you use. Also tell them if you smoke, drink alcohol, or use illegal drugs. Some items may interact with your medicine.  What should I watch for while using this medicine?  Visit your doctor or health care professional for regular checks on your progress. Your doctor or health care professional may order blood tests and other tests to see how you are doing.  Call your doctor or health care professional if you get a cold or other infection while receiving this medicine. Do not treat yourself. This medicine may decrease your body's ability to fight infection.  You should make sure you get enough calcium and vitamin D while you are taking this medicine, unless your doctor tells you not to. Discuss the foods you eat and the vitamins you take with your health care professional.    See your dentist regularly. Brush and floss your teeth as directed. Before you have any dental work done, tell your dentist you are receiving this medicine.  Do not become pregnant while taking this medicine or for 5 months after stopping it. Women should inform their doctor if they wish to become pregnant or think they might be pregnant. There is a potential for serious side effects to an unborn child. Talk to your health care professional or pharmacist for more  information.  What side effects may I notice from receiving this medicine?  Side effects that you should report to your doctor or health care professional as soon as possible:  -allergic reactions like skin rash, itching or hives, swelling of the face, lips, or tongue  -breathing problems  -chest pain  -fast, irregular heartbeat  -feeling faint or lightheaded, falls  -fever, chills, or any other sign of infection  -muscle spasms, tightening, or twitches  -numbness or tingling  -skin blisters or bumps, or is dry, peels, or red  -slow healing or unexplained pain in the mouth or jaw  -unusual bleeding or bruising  Side effects that usually do not require medical attention (Report these to your doctor or health care professional if they continue or are bothersome.):  -muscle pain  -stomach upset, gas  This list may not describe all possible side effects. Call your doctor for medical advice about side effects. You may report side effects to FDA at 1-800-FDA-1088.  Where should I keep my medicine?  This medicine is only given in a clinic, doctor's office, or other health care setting and will not be stored at home.  NOTE: This sheet is a summary. It may not cover all possible information. If you have questions about this medicine, talk to your doctor, pharmacist, or health care provider.  © 2014, [employer]vier/Gold Standard. (2011-08-08 12:37:47)

## 2013-05-02 NOTE — Progress Notes (Signed)
prolia given and tolerated well

## 2013-06-19 ENCOUNTER — Telehealth: Payer: Self-pay | Admitting: Pharmacist

## 2013-06-19 NOTE — Telephone Encounter (Signed)
We have tried to contact patient regarding scheduled mammogram.  Unable to reach patient or her emergency contacts.  Recommend send letter to patient regarding mammogram appt.

## 2013-06-19 NOTE — Telephone Encounter (Signed)
Message copied by Cherre Robins on Wed Jun 19, 2013 11:49 AM ------      Message from: Naida Sleight      Created: Wed Jun 19, 2013 11:38 AM       [name],FYI  I have been unable to contact this pt for mammogram. Her telephone # is not set up for messages.        ----- Message -----         From: Cherre Robins, PHARMD         Sent: 05/01/2013   3:14 PM           To: Posey Pronto,       Robin [name] needs follow up mammogram with mobile unit / [facility].      Her last mammogram per Remo Lipps was 01/26/2012.      Will you set this appt up for her?            Thanks so much!      Robin [name]       ------

## 2013-07-31 ENCOUNTER — Ambulatory Visit (INDEPENDENT_AMBULATORY_CARE_PROVIDER_SITE_OTHER): Payer: Medicare Other | Admitting: Pharmacist

## 2013-07-31 ENCOUNTER — Ambulatory Visit: Payer: Medicare Other

## 2013-07-31 ENCOUNTER — Encounter: Payer: Self-pay | Admitting: Pharmacist

## 2013-07-31 VITALS — Ht 62.0 in | Wt 120.0 lb

## 2013-07-31 DIAGNOSIS — E559 Vitamin D deficiency, unspecified: Secondary | ICD-10-CM

## 2013-07-31 DIAGNOSIS — M810 Age-related osteoporosis without current pathological fracture: Secondary | ICD-10-CM

## 2013-07-31 DIAGNOSIS — Z13820 Encounter for screening for osteoporosis: Secondary | ICD-10-CM | POA: Diagnosis not present

## 2013-07-31 LAB — HM DEXA SCAN

## 2013-07-31 NOTE — Progress Notes (Signed)
Patient ID: Robin [name], female   DOB: 09/09/1940, 72 y.o.   MRN: [id number] [facility] Current Height: Height: 5' 2" (157.5 cm)      Current Weight: Weight: 120 lb (54.432 kg)       Ethnicity:Caucasian    HPI: Patient is here today to have DEXA rechecked but her last DEXA was just performed 4 months ago 04/17/2013. She has osteoporosis and is treating with Prolia 60mg SQ every 6 months.  Her next injection is due 10/2013. She is also has a history of vitamin D insufficiency and needs vitamin D rechecked.  She is currently taking cholecalciferol 2000IU daily and caltrate 600mg plud D once daily.                                                              DEXA Results Date of Test T-Score for AP Spine L1-L4 T-Score for Total Left Hip T-Score for Total Right Hip  04/17/2013 -2.6 -3.5 -3.0                  Assessment: Vitamin D insufficiency Osteoporosis  Recommendations: 1.  Continue prolia 60mg SQ q 6 months 2.  continue calcium 1200mg daily through supplementation or diet.  3.  recommend weight bearing exercise - 30 minutes at least 4 days per week.   4.  Counseled and educated about fall risk and prevention. 5.   Orders Placed This Encounter  Procedures  . Vit D  25 hydroxy (rtn osteoporosis monitoring)  . Magnesium  . BMP8+EGFR     Recheck DEXA:  2 years  Time spent counseling patient:  10 minutes  Tammy Eckard, PharmD, CPP         

## 2013-08-01 LAB — BMP8+EGFR
BUN/Creatinine Ratio: 9 — ABNORMAL LOW (ref 11–26)
BUN: 8 mg/dL (ref 8–27)
CHLORIDE: 104 mmol/L (ref 97–108)
CO2: 28 mmol/L (ref 18–29)
Calcium: 9.4 mg/dL (ref 8.7–10.3)
Creatinine, Ser: 0.87 mg/dL (ref 0.57–1.00)
GFR calc non Af Amer: 67 mL/min/{1.73_m2} (ref >59)
GFR, EST AFRICAN AMERICAN: 78 mL/min/{1.73_m2} (ref >59)
GLUCOSE: 85 mg/dL (ref 65–99)
Potassium: 4.5 mmol/L (ref 3.5–5.2)
SODIUM: 145 mmol/L — AB (ref 134–144)

## 2013-08-01 LAB — MAGNESIUM: Magnesium: 2 mg/dL (ref 1.6–2.6)

## 2013-08-01 LAB — VITAMIN D 25 HYDROXY (VIT D DEFICIENCY, FRACTURES): VIT D 25 HYDROXY: 24.5 ng/mL — AB (ref 30.0–100.0)

## 2013-08-02 ENCOUNTER — Telehealth: Payer: Self-pay | Admitting: Pharmacist

## 2013-08-02 NOTE — Telephone Encounter (Signed)
Unable to reach patient by phone - sent letter with lab results and recommendation to increase OTC vitamin D to 2500IU daily.

## 2013-10-13 ENCOUNTER — Other Ambulatory Visit: Payer: Self-pay | Admitting: Pharmacist

## 2013-10-13 MED ORDER — DENOSUMAB 60 MG/ML ~~LOC~~ SOLN: SUBCUTANEOUS | Status: DC

## 2013-11-25 ENCOUNTER — Telehealth: Payer: Self-pay | Admitting: Nurse Practitioner

## 2013-11-25 NOTE — Telephone Encounter (Signed)
rx has already been called in in September

## 2013-11-26 ENCOUNTER — Telehealth: Payer: Self-pay | Admitting: Nurse Practitioner

## 2013-11-26 NOTE — Telephone Encounter (Signed)
Pt notified rx has already been called in for prolia med.

## 2013-11-27 NOTE — Telephone Encounter (Signed)
Called [facility] and they have Rx - was filled 09/2013 but they returned to stock because was never picked up.  We and

## 2013-11-27 NOTE — Telephone Encounter (Signed)
[facility] had difficulty contacting patient to pick up Prolia due to phone changes.  Letter was sent and patient called to update contact information. Called [facility] filled Rx on file today.  Patient can pick up today or tomorrow am prior to appt.  Patient notified.

## 2013-11-28 ENCOUNTER — Ambulatory Visit (INDEPENDENT_AMBULATORY_CARE_PROVIDER_SITE_OTHER): Payer: Medicare Other | Admitting: Pharmacist

## 2013-11-28 VITALS — Ht 62.0 in

## 2013-11-28 DIAGNOSIS — M810 Age-related osteoporosis without current pathological fracture: Secondary | ICD-10-CM | POA: Diagnosis not present

## 2013-11-28 DIAGNOSIS — Z23 Encounter for immunization: Secondary | ICD-10-CM

## 2013-11-28 MED ORDER — DENOSUMAB 60 MG/ML ~~LOC~~ SOLN
60.0000 mg | Freq: Once | SUBCUTANEOUS | Status: AC
  Administered 2013-11-28: 60 mg via SUBCUTANEOUS

## 2013-11-28 MED ORDER — VITAMIN D 50 MCG (2000 UT) PO TABS: ORAL_TABLET | ORAL | Status: AC
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
## 2013-12-26 ENCOUNTER — Encounter: Payer: Self-pay | Admitting: Nurse Practitioner

## 2013-12-26 ENCOUNTER — Ambulatory Visit (INDEPENDENT_AMBULATORY_CARE_PROVIDER_SITE_OTHER): Payer: Medicare Other | Admitting: Nurse Practitioner

## 2013-12-26 VITALS — BP 142/83 | HR 70 | Temp 98.7°F | Ht 62.0 in | Wt 117.2 lb

## 2013-12-26 DIAGNOSIS — E785 Hyperlipidemia, unspecified: Secondary | ICD-10-CM | POA: Diagnosis not present

## 2013-12-26 DIAGNOSIS — E559 Vitamin D deficiency, unspecified: Secondary | ICD-10-CM | POA: Diagnosis not present

## 2013-12-26 DIAGNOSIS — M810 Age-related osteoporosis without current pathological fracture: Secondary | ICD-10-CM

## 2013-12-26 MED ORDER — ATORVASTATIN CALCIUM 80 MG PO TABS: 80.0000 mg | ORAL_TABLET | Freq: Every day | ORAL | Status: DC

## 2013-12-26 NOTE — Progress Notes (Signed)
  Subjective:    Patient ID: Robin [name], female    DOB: Jan 26, 1941, 73 y.o.   MRN: [id number]   Patient here today for follow up of chronic medical problems.   Hyperlipidemia This is a chronic problem. The current episode started more than 1 year ago. The problem is controlled. Recent lipid tests were reviewed and are normal. She has no history of diabetes, hypothyroidism or obesity. Pertinent negatives include no chest pain, myalgias or shortness of breath. The current treatment provides moderate improvement of lipids. Compliance problems include adherence to diet and adherence to exercise.  Risk factors for coronary artery disease include dyslipidemia.  Vitamin D DEFICIENCY Vitamin D 2000 Iu OTC daily- tolerating well osteoporosis Calcium and vitamin d daily- no c/o back pain.     Review of Systems  Constitutional: Negative for appetite change and unexpected weight change.  Respiratory: Negative for shortness of breath.   Cardiovascular: Negative for chest pain, palpitations and leg swelling.  Genitourinary: Negative.   Musculoskeletal: Negative for myalgias.  Neurological: Negative.   All other systems reviewed and are negative.      Objective:   Physical Exam  Constitutional: She is oriented to person, place, and time. She appears well-developed and well-nourished.  HENT:  Nose: Nose normal.  Mouth/Throat: Oropharynx is clear and moist.  Eyes: EOM are normal.  Neck: Trachea normal, normal range of motion and full passive range of motion without pain. Neck supple. No JVD present. Carotid bruit is not present. No thyromegaly present.  Cardiovascular: Normal rate, regular rhythm, normal heart sounds and intact distal pulses.  Exam reveals no gallop and no friction rub.   No murmur heard. Pulmonary/Chest: Effort normal and breath sounds normal.  Abdominal: Soft. Bowel sounds are normal. She exhibits no distension and no mass. There is no tenderness.  Musculoskeletal: Normal  range of motion.  Lymphadenopathy:    She has no cervical adenopathy.  Neurological: She is alert and oriented to person, place, and time. She has normal reflexes.  Skin: Skin is warm and dry.  Psychiatric: She has a normal mood and affect. Her behavior is normal. Judgment and thought content normal.     BP 142/83 mmHg  Pulse 70  Temp(Src) 98.7 F (37.1 C) (Oral)  Ht $R'5\' 2"'ND$  (1.575 m)  Wt 117 lb 3.2 oz (53.162 kg)  BMI 21.43 kg/m2      Assessment & Plan:   1. Osteoporosis Weight bearing exercises- prolia as indicated  2. Hyperlipidemia Low fat diet - CMP14+EGFR - NMR, lipoprofile - atorvastatin (LIPITOR) 80 MG tablet; Take 1 tablet (80 mg total) by mouth daily.  Dispense: 900 tablet; Refill: 1  3. Vitamin D insufficiency Cont vid d daily  Pt to schedule mammogram hemoccult cards given to patient with directions Labs pending Health maintenance reviewed Diet and exercise encouraged Continue all meds Follow up  In 6 months   [place], FNP

## 2013-12-26 NOTE — Patient Instructions (Signed)

## 2013-12-27 LAB — NMR, LIPOPROFILE
CHOLESTEROL: 211 mg/dL — AB (ref 100–199)
HDL CHOLESTEROL BY NMR: 48 mg/dL (ref >39)
HDL PARTICLE NUMBER: 24.8 umol/L — AB (ref >=30.5)
LDL Particle Number: 1820 nmol/L — ABNORMAL HIGH (ref <1000)
LDL Size: 21.3 nm (ref >20.5)
LDL-C: 144 mg/dL — ABNORMAL HIGH (ref 0–99)
LP-IR SCORE: 40 (ref <=45)
SMALL LDL PARTICLE NUMBER: 504 nmol/L (ref <=527)
TRIGLYCERIDES BY NMR: 96 mg/dL (ref 0–149)

## 2013-12-27 LAB — CMP14+EGFR
ALBUMIN: 4.1 g/dL (ref 3.5–4.8)
ALK PHOS: 56 IU/L (ref 39–117)
ALT: 16 IU/L (ref 0–32)
AST: 21 IU/L (ref 0–40)
Albumin/Globulin Ratio: 1.6 (ref 1.1–2.5)
BILIRUBIN TOTAL: 0.6 mg/dL (ref 0.0–1.2)
BUN/Creatinine Ratio: 10 — ABNORMAL LOW (ref 11–26)
BUN: 9 mg/dL (ref 8–27)
CALCIUM: 9.1 mg/dL (ref 8.7–10.3)
CHLORIDE: 100 mmol/L (ref 97–108)
CO2: 26 mmol/L (ref 18–29)
CREATININE: 0.92 mg/dL (ref 0.57–1.00)
GFR calc Af Amer: 72 mL/min/{1.73_m2} (ref >59)
GFR, EST NON AFRICAN AMERICAN: 62 mL/min/{1.73_m2} (ref >59)
GLOBULIN, TOTAL: 2.6 g/dL (ref 1.5–4.5)
Glucose: 81 mg/dL (ref 65–99)
POTASSIUM: 3.6 mmol/L (ref 3.5–5.2)
SODIUM: 141 mmol/L (ref 134–144)
TOTAL PROTEIN: 6.7 g/dL (ref 6.0–8.5)

## 2013-12-31 ENCOUNTER — Encounter: Payer: Self-pay | Admitting: *Deleted

## 2014-03-03 ENCOUNTER — Telehealth: Payer: Self-pay | Admitting: Nurse Practitioner

## 2014-03-03 NOTE — Telephone Encounter (Signed)
Appointment given for tomorrow with Ronnald Collum, FNP.

## 2014-03-04 ENCOUNTER — Ambulatory Visit (INDEPENDENT_AMBULATORY_CARE_PROVIDER_SITE_OTHER): Payer: Medicare Other | Admitting: Nurse Practitioner

## 2014-03-04 ENCOUNTER — Encounter (INDEPENDENT_AMBULATORY_CARE_PROVIDER_SITE_OTHER): Payer: Self-pay

## 2014-03-04 ENCOUNTER — Encounter: Payer: Self-pay | Admitting: Nurse Practitioner

## 2014-03-04 VITALS — BP 159/86 | HR 64 | Temp 96.9°F | Ht 62.0 in | Wt 113.0 lb

## 2014-03-04 DIAGNOSIS — M79609 Pain in unspecified limb: Secondary | ICD-10-CM | POA: Diagnosis not present

## 2014-03-04 DIAGNOSIS — Z1212 Encounter for screening for malignant neoplasm of rectum: Secondary | ICD-10-CM

## 2014-03-04 MED ORDER — METHYLPREDNISOLONE ACETATE 80 MG/ML IJ SUSP
80.0000 mg | Freq: Once | INTRAMUSCULAR | Status: AC
  Administered 2014-03-04: 80 mg via INTRAMUSCULAR

## 2014-03-04 NOTE — Addendum Note (Signed)
Addended by: Earlene Plater on: 03/04/2014 04:18 PM   Modules accepted: Orders

## 2014-03-04 NOTE — Progress Notes (Signed)
   Subjective:    Patient ID: Robin [name], female    DOB: [birth year], 74 y.o.   MRN: [id number]  HPI Patient in today c/o pain from elbows down bil and knees down bilaterally- Started about 1 week ago- no better- has tried advil with no relief. Denies any joint swelling- no fever. She rates the pain 8/10- walking increases pain. Elevating feet eases some of the pain in her legs.    Review of Systems  Constitutional: Negative.   HENT: Negative.   Respiratory: Negative.   Cardiovascular: Negative.   Gastrointestinal: Negative.   Genitourinary: Negative.   Neurological: Negative.   Psychiatric/Behavioral: Negative.   All other systems reviewed and are negative.      Objective:   Physical Exam  Constitutional: She is oriented to person, place, and time. She appears well-developed and well-nourished.  Cardiovascular: Normal rate, regular rhythm and normal heart sounds.   Pulmonary/Chest: Effort normal and breath sounds normal.  Abdominal: Soft. Bowel sounds are normal.  Musculoskeletal:  No joint effusions noted No edema of joints noted FROM of knees, elbows, wrists and ankles bil without pain Motor strength and sensation of all extremities intact   Neurological: She is alert and oriented to person, place, and time. She has normal reflexes.  Skin: Skin is warm and dry.  Psychiatric: She has a normal mood and affect. Her behavior is normal. Judgment and thought content normal.    BP 159/86 mmHg  Pulse 64  Temp(Src) 96.9 F (36.1 C) (Oral)  Ht 5\' 2"  (1.575 m)  Wt 113 lb (51.256 kg)  BMI 20.66 kg/m2       Assessment & Plan:   1. Pain in extremity, unspecified extremity, unspecified laterality     Orders Placed This Encounter  Procedures  . Arthritis Panel   Meds ordered this encounter  Medications  . methylPREDNISolone acetate (DEPO-MEDROL) injection 80 mg    Sig:    Rest Keep ext warm  Elevate legs when sitting RTO if  Not improving  Robin  [name] Done, FNP

## 2014-03-05 LAB — ARTHRITIS PANEL
BASOS ABS: 0 10*3/uL (ref 0.0–0.2)
BASOS: 0 %
EOS ABS: 0.1 10*3/uL (ref 0.0–0.4)
EOS: 2 %
HEMATOCRIT: 34.3 % (ref 34.0–46.6)
HEMOGLOBIN: 11.5 g/dL (ref 11.1–15.9)
Immature Grans (Abs): 0 10*3/uL (ref 0.0–0.1)
Immature Granulocytes: 0 %
LYMPHS ABS: 1.4 10*3/uL (ref 0.7–3.1)
LYMPHS: 26 %
MCH: 29 pg (ref 26.6–33.0)
MCHC: 33.5 g/dL (ref 31.5–35.7)
MCV: 86 fL (ref 79–97)
MONOCYTES: 9 %
MONOS ABS: 0.5 10*3/uL (ref 0.1–0.9)
NEUTROS ABS: 3.3 10*3/uL (ref 1.4–7.0)
NEUTROS PCT: 63 %
Platelets: 278 10*3/uL (ref 150–379)
RBC: 3.97 x10E6/uL (ref 3.77–5.28)
RDW: 13.9 % (ref 12.3–15.4)
RHEUMATOID FACTOR: 12.3 [IU]/mL (ref 0.0–13.9)
SED RATE: 16 mm/h (ref 0–40)
URIC ACID: 3.6 mg/dL (ref 2.5–7.1)
WBC: 5.3 10*3/uL (ref 3.4–10.8)

## 2014-03-06 ENCOUNTER — Telehealth: Payer: Self-pay | Admitting: *Deleted

## 2014-03-06 LAB — FECAL OCCULT BLOOD, IMMUNOCHEMICAL: FECAL OCCULT BLD: NEGATIVE

## 2014-03-06 NOTE — Telephone Encounter (Signed)
-----   Message from Chevis Pretty, [place] sent at 03/06/2014 10:07 AM EST ----- Fecal occult negative Arthritis panel normal

## 2014-03-06 NOTE — Telephone Encounter (Signed)
Pt notified of results Verbalizes understanding 

## 2014-04-09 DIAGNOSIS — L309 Dermatitis, unspecified: Secondary | ICD-10-CM | POA: Diagnosis not present

## 2014-04-23 ENCOUNTER — Telehealth: Payer: Self-pay | Admitting: Nurse Practitioner

## 2014-04-23 NOTE — Telephone Encounter (Signed)
Robin [name] is not actually due until 05/30/14.  Spoke with her and will wait until closer to that date to order [name] from [place] [facility].

## 2014-05-15 ENCOUNTER — Other Ambulatory Visit: Payer: Self-pay | Admitting: Pharmacist

## 2014-05-15 MED ORDER — DENOSUMAB 60 MG/ML ~~LOC~~ SOLN: SUBCUTANEOUS | Status: DC

## 2014-05-30 ENCOUNTER — Encounter: Payer: Self-pay | Admitting: Pharmacist

## 2014-05-30 ENCOUNTER — Ambulatory Visit (INDEPENDENT_AMBULATORY_CARE_PROVIDER_SITE_OTHER): Payer: Medicare Other | Admitting: Pharmacist

## 2014-05-30 VITALS — BP 145/77 | HR 68 | Ht 62.0 in | Wt 106.0 lb

## 2014-05-30 DIAGNOSIS — E538 Deficiency of other specified B group vitamins: Secondary | ICD-10-CM | POA: Diagnosis not present

## 2014-05-30 DIAGNOSIS — N189 Chronic kidney disease, unspecified: Secondary | ICD-10-CM | POA: Diagnosis not present

## 2014-05-30 DIAGNOSIS — I10 Essential (primary) hypertension: Secondary | ICD-10-CM | POA: Diagnosis not present

## 2014-05-30 DIAGNOSIS — Z0000 Encounter for general adult medical examination without abnormal findings: Secondary | ICD-10-CM

## 2014-05-30 DIAGNOSIS — E559 Vitamin D deficiency, unspecified: Secondary | ICD-10-CM | POA: Diagnosis not present

## 2014-05-30 DIAGNOSIS — D126 Benign neoplasm of colon, unspecified: Secondary | ICD-10-CM | POA: Diagnosis not present

## 2014-05-30 DIAGNOSIS — Z23 Encounter for immunization: Secondary | ICD-10-CM

## 2014-05-30 DIAGNOSIS — F329 Major depressive disorder, single episode, unspecified: Secondary | ICD-10-CM | POA: Diagnosis not present

## 2014-05-30 DIAGNOSIS — B20 Human immunodeficiency virus [HIV] disease: Secondary | ICD-10-CM | POA: Diagnosis not present

## 2014-05-30 DIAGNOSIS — R636 Underweight: Secondary | ICD-10-CM

## 2014-05-30 DIAGNOSIS — Z125 Encounter for screening for malignant neoplasm of prostate: Secondary | ICD-10-CM | POA: Diagnosis not present

## 2014-05-30 MED ORDER — LISINOPRIL 10 MG PO TABS: 10.0000 mg | ORAL_TABLET | Freq: Every day | ORAL | Status: DC

## 2014-05-30 MED ORDER — ENSURE ENLIVE PO LIQD: 1.0000 | Freq: Every day | ORAL | Status: DC

## 2014-05-30 NOTE — Patient Instructions (Signed)
Thanks for coming in for your Annual Wellness Visit!  Its our pleasure to keep you healthy!  Start Lisinopril 70m take 1 tablet daily - for blood pressure  Start Ensure - drink one can daily to help with weight and general nutrition (goal weight is 120 lbs.)  We have sent referral for repeat colonoscopy. You should received call with appointment in 1-2 weeks.  Call office if you do not receive a call.   Mammogram set for Wednesday, May 25th 2016 at 10am.  [facility]   Reminder:  Make appointment to make comprehensive eye exam with Dr [facility]arlie [facility] - past due recheck.      Preventive Care for Adults A healthy lifestyle and preventive care can promote health and wellness. Preventive health guidelines for women include the following key practices.  A routine yearly physical is a good way to check with your health care provider about your health and preventive screening. It is a chance to share any concerns and updates on your health and to receive a thorough exam.  Visit your dentist for a routine exam and preventive care every 6 months. Brush your teeth twice a day and floss once a day. Good oral hygiene prevents tooth decay and gum disease.  The frequency of eye exams is based on your age, health, family medical history, use of contact lenses, and other factors. Follow your health care provider's recommendations for frequency of eye exams.  Eat a healthy diet. Foods like vegetables, fruits, whole grains, low-fat dairy products, and lean protein foods contain the nutrients you need without too many calories. Decrease your intake of foods high in solid fats, added sugars, and salt. Eat the right amount of calories for you.Get information about a proper diet from your health care provider, if necessary.  Regular physical exercise is one of the most important things you can do for your health. Most adults  should get at least 150 minutes of moderate-intensity exercise (any activity that increases your heart rate and causes you to sweat) each week. In addition, most adults need muscle-strengthening exercises on 2 or more days a week.  Maintain a healthy weight. The body mass index (BMI) is a screening tool to identify possible weight problems. It provides an estimate of body fat based on height and weight. Your health care provider can find your BMI and can help you achieve or maintain a healthy weight.For adults 20 years and older:  A BMI below 18.5 is considered underweight.  A BMI of 18.5 to 24.9 is normal.  A BMI of 25 to 29.9 is considered overweight.  A BMI of 30 and above is considered obese.  Maintain normal blood lipids and cholesterol levels by exercising and minimizing your intake of saturated fat. Eat a balanced diet with plenty of fruit and vegetables. Blood tests for lipids and cholesterol should begin at age 3166and be repeated every 5 years. If your lipid or cholesterol levels are high, you are over 50, or you are at high risk for heart disease, you may need your cholesterol levels checked more frequently.Ongoing high lipid and cholesterol levels should be treated with medicines if diet and exercise are not working.  If you smoke, find out from your health care provider how to quit. If you do not use tobacco, do not start.  Lung cancer screening is recommended for adults aged 566-80years who are at high risk for developing lung  cancer because of a history of smoking. A yearly low-dose CT scan of the lungs is recommended for people who have at least a 30-pack-year history of smoking and are a current smoker or have quit within the past 15 years. A pack year of smoking is smoking an average of 1 pack of cigarettes a day for 1 year (for example: 1 pack a day for 30 years or 2 packs a day for 15 years). Yearly screening should continue until the smoker has stopped smoking for at least 15  years. Yearly screening should be stopped for people who develop a health problem that would prevent them from having lung cancer treatment.  If you are pregnant, do not drink alcohol. If you are breastfeeding, be very cautious about drinking alcohol. If you are not pregnant and choose to drink alcohol, do not have more than 1 drink per day. One drink is considered to be 12 ounces (355 mL) of beer, 5 ounces (148 mL) of wine, or 1.5 ounces (44 mL) of liquor.  Avoid use of street drugs. Do not share needles with anyone. Ask for help if you need support or instructions about stopping the use of drugs.  High blood pressure causes heart disease and increases the risk of stroke. Your blood pressure should be checked at least every 1 to 2 years. Ongoing high blood pressure should be treated with medicines if weight loss and exercise do not work.  If you are 40-79 years old, ask your health care provider if you should take aspirin to prevent strokes.  Diabetes screening involves taking a blood sample to check your fasting blood sugar level. This should be done once every 3 years, after age 82, if you are within normal weight and without risk factors for diabetes. Testing should be considered at a younger age or be carried out more frequently if you are overweight and have at least 1 risk factor for diabetes.  Breast cancer screening is essential preventive care for women. You should practice "breast self-awareness." This means understanding the normal appearance and feel of your breasts and may include breast self-examination. Any changes detected, no matter how small, should be reported to a health care provider. Women in their 6s and 30s should have a clinical breast exam (CBE) by a health care provider as part of a regular health exam every 1 to 3 years. After age 36, women should have a CBE every year. Starting at age 61, women should consider having a mammogram (breast X-ray test) every year. Women who  have a family history of breast cancer should talk to their health care provider about genetic screening. Women at a high risk of breast cancer should talk to their health care providers about having an MRI and a mammogram every year.  Breast cancer gene (BRCA)-related cancer risk assessment is recommended for women who have family members with BRCA-related cancers. BRCA-related cancers include breast, ovarian, tubal, and peritoneal cancers. Having family members with these cancers may be associated with an increased risk for harmful changes (mutations) in the breast cancer genes BRCA1 and BRCA2. Results of the assessment will determine the need for genetic counseling and BRCA1 and BRCA2 testing.  Routine pelvic exams to screen for cancer are no longer recommended for nonpregnant women who are considered low risk for cancer of the pelvic organs (ovaries, uterus, and vagina) and who do not have symptoms. Ask your health care provider if a screening pelvic exam is right for you.  If you have  had past treatment for cervical cancer or a condition that could lead to cancer, you need Pap tests and screening for cancer for at least 20 years after your treatment. If Pap tests have been discontinued, your risk factors (such as having a new sexual partner) need to be reassessed to determine if screening should be resumed. Some women have medical problems that increase the chance of getting cervical cancer. In these cases, your health care provider may recommend more frequent screening and Pap tests.  The HPV test is an additional test that may be used for cervical cancer screening. The HPV test looks for the virus that can cause the cell changes on the cervix. The cells collected during the Pap test can be tested for HPV. The HPV test could be used to screen women aged 63 years and older, and should be used in women of any age who have unclear Pap test results. After the age of 89, women should have HPV testing at the  same frequency as a Pap test.  Colorectal cancer can be detected and often prevented. Most routine colorectal cancer screening begins at the age of 54 years and continues through age 51 years. However, your health care provider may recommend screening at an earlier age if you have risk factors for colon cancer. On a yearly basis, your health care provider may provide home test kits to check for hidden blood in the stool. Use of a small camera at the end of a tube, to directly examine the colon (sigmoidoscopy or colonoscopy), can detect the earliest forms of colorectal cancer. Talk to your health care provider about this at age 50, when routine screening begins. Direct exam of the colon should be repeated every 5-10 years through age 70 years, unless early forms of pre-cancerous polyps or small growths are found.  People who are at an increased risk for hepatitis B should be screened for this virus. You are considered at high risk for hepatitis B if:  You were born in a country where hepatitis B occurs often. Talk with your health care provider about which countries are considered high risk.  Your parents were born in a high-risk country and you have not received a shot to protect against hepatitis B (hepatitis B vaccine).  You have HIV or AIDS.  You use needles to inject street drugs.  You live with, or have sex with, someone who has hepatitis B.  You get hemodialysis treatment.  You take certain medicines for conditions like cancer, organ transplantation, and autoimmune conditions.  Hepatitis C blood testing is recommended for all people born from [birth year] through 1965 and any individual with known risks for hepatitis C.  Practice safe sex. Use condoms and avoid high-risk sexual practices to reduce the spread of sexually transmitted infections (STIs). STIs include gonorrhea, chlamydia, syphilis, trichomonas, herpes, HPV, and human immunodeficiency virus (HIV). Herpes, HIV, and HPV are viral  illnesses that have no cure. They can result in disability, cancer, and death.  You should be screened for sexually transmitted illnesses (STIs) including gonorrhea and chlamydia if:  You are sexually active and are younger than 24 years.  You are older than 24 years and your health care provider tells you that you are at risk for this type of infection.  Your sexual activity has changed since you were last screened and you are at an increased risk for chlamydia or gonorrhea. Ask your health care provider if you are at risk.  If you are at risk  of being infected with HIV, it is recommended that you take a prescription medicine daily to prevent HIV infection. This is called preexposure prophylaxis (PrEP). You are considered at risk if:  You are a heterosexual woman, are sexually active, and are at increased risk for HIV infection.  You take drugs by injection.  You are sexually active with a partner who has HIV.  Talk with your health care provider about whether you are at high risk of being infected with HIV. If you choose to begin PrEP, you should first be tested for HIV. You should then be tested every 3 months for as long as you are taking PrEP.  Osteoporosis is a disease in which the bones lose minerals and strength with aging. This can result in serious bone fractures or breaks. The risk of osteoporosis can be identified using a bone density scan. Women ages 63 years and over and women at risk for fractures or osteoporosis should discuss screening with their health care providers. Ask your health care provider whether you should take a calcium supplement or vitamin D to reduce the rate of osteoporosis.  Menopause can be associated with physical symptoms and risks. Hormone replacement therapy is available to decrease symptoms and risks. You should talk to your health care provider about whether hormone replacement therapy is right for you.  Use sunscreen. Apply sunscreen liberally and  repeatedly throughout the day. You should seek shade when your shadow is shorter than you. Protect yourself by wearing long sleeves, pants, a wide-brimmed hat, and sunglasses year round, whenever you are outdoors.  Once a month, do a whole body skin exam, using a mirror to look at the skin on your back. Tell your health care provider of new moles, moles that have irregular borders, moles that are larger than a pencil eraser, or moles that have changed in shape or color.  Stay current with required vaccines (immunizations).  Influenza vaccine. All adults should be immunized every year.  Tetanus, diphtheria, and acellular pertussis (Td, Tdap) vaccine. Pregnant women should receive 1 dose of Tdap vaccine during each pregnancy. The dose should be obtained regardless of the length of time since the last dose. Immunization is preferred during the 27th-36th week of gestation. An adult who has not previously received Tdap or who does not know her vaccine status should receive 1 dose of Tdap. This initial dose should be followed by tetanus and diphtheria toxoids (Td) booster doses every 10 years. Adults with an unknown or incomplete history of completing a 3-dose immunization series with Td-containing vaccines should begin or complete a primary immunization series including a Tdap dose. Adults should receive a Td booster every 10 years.  Varicella vaccine. An adult without evidence of immunity to varicella should receive 2 doses or a second dose if she has previously received 1 dose. Pregnant females who do not have evidence of immunity should receive the first dose after pregnancy. This first dose should be obtained before leaving the health care facility. The second dose should be obtained 4-8 weeks after the first dose.  Human papillomavirus (HPV) vaccine. Females aged 13-26 years who have not received the vaccine previously should obtain the 3-dose series. The vaccine is not recommended for use in pregnant  females. However, pregnancy testing is not needed before receiving a dose. If a female is found to be pregnant after receiving a dose, no treatment is needed. In that case, the remaining doses should be delayed until after the pregnancy. Immunization is recommended for  any person with an immunocompromised condition through the age of 44 years if she did not get any or all doses earlier. During the 3-dose series, the second dose should be obtained 4-8 weeks after the first dose. The third dose should be obtained 24 weeks after the first dose and 16 weeks after the second dose.  Zoster vaccine. One dose is recommended for adults aged 58 years or older unless certain conditions are present.  Measles, mumps, and rubella (MMR) vaccine. Adults born before [birth year] generally are considered immune to measles and mumps. Adults born in [birth year] or later should have 1 or more doses of MMR vaccine unless there is a contraindication to the vaccine or there is laboratory evidence of immunity to each of the three diseases. A routine second dose of MMR vaccine should be obtained at least 28 days after the first dose for students attending postsecondary schools, health care workers, or international travelers. People who received inactivated measles vaccine or an unknown type of measles vaccine during 1963-1967 should receive 2 doses of MMR vaccine. People who received inactivated mumps vaccine or an unknown type of mumps vaccine before 1979 and are at high risk for mumps infection should consider immunization with 2 doses of MMR vaccine. For females of childbearing age, rubella immunity should be determined. If there is no evidence of immunity, females who are not pregnant should be vaccinated. If there is no evidence of immunity, females who are pregnant should delay immunization until after pregnancy. Unvaccinated health care workers born before [birth year] who lack laboratory evidence of measles, mumps, or rubella immunity or laboratory  confirmation of disease should consider measles and mumps immunization with 2 doses of MMR vaccine or rubella immunization with 1 dose of MMR vaccine.  Pneumococcal 13-valent conjugate (PCV13) vaccine. When indicated, a person who is uncertain of her immunization history and has no record of immunization should receive the PCV13 vaccine. An adult aged 59 years or older who has certain medical conditions and has not been previously immunized should receive 1 dose of PCV13 vaccine. This PCV13 should be followed with a dose of pneumococcal polysaccharide (PPSV23) vaccine. The PPSV23 vaccine dose should be obtained at least 8 weeks after the dose of PCV13 vaccine. An adult aged 50 years or older who has certain medical conditions and previously received 1 or more doses of PPSV23 vaccine should receive 1 dose of PCV13. The PCV13 vaccine dose should be obtained 1 or more years after the last PPSV23 vaccine dose.  Pneumococcal polysaccharide (PPSV23) vaccine. When PCV13 is also indicated, PCV13 should be obtained first. All adults aged 49 years and older should be immunized. An adult younger than age 2 years who has certain medical conditions should be immunized. Any person who resides in a nursing home or long-term care facility should be immunized. An adult smoker should be immunized. People with an immunocompromised condition and certain other conditions should receive both PCV13 and PPSV23 vaccines. People with human immunodeficiency virus (HIV) infection should be immunized as soon as possible after diagnosis. Immunization during chemotherapy or radiation therapy should be avoided. Routine use of PPSV23 vaccine is not recommended for American Indians, [place] Natives, or people younger than 65 years unless there are medical conditions that require PPSV23 vaccine. When indicated, people who have unknown immunization and have no record of immunization should receive PPSV23 vaccine. One-time revaccination 5 years  after the first dose of PPSV23 is recommended for people aged 19-64 years who have chronic kidney failure, nephrotic syndrome,  asplenia, or immunocompromised conditions. People who received 1-2 doses of PPSV23 before age 49 years should receive another dose of PPSV23 vaccine at age 62 years or later if at least 5 years have passed since the previous dose. Doses of PPSV23 are not needed for people immunized with PPSV23 at or after age 68 years.  Meningococcal vaccine. Adults with asplenia or persistent complement component deficiencies should receive 2 doses of quadrivalent meningococcal conjugate (MenACWY-D) vaccine. The doses should be obtained at least 2 months apart. Microbiologists working with certain meningococcal bacteria, [employer], people at risk during an outbreak, and people who travel to or live in countries with a high rate of meningitis should be immunized. A first-year college student up through age 56 years who is living in a residence hall should receive a dose if she did not receive a dose on or after her 16th birthday. Adults who have certain high-risk conditions should receive one or more doses of vaccine.  Hepatitis A vaccine. Adults who wish to be protected from this disease, have certain high-risk conditions, work with hepatitis A-infected animals, work in hepatitis A research labs, or travel to or work in countries with a high rate of hepatitis A should be immunized. Adults who were previously unvaccinated and who anticipate close contact with an international adoptee during the first 60 days after arrival in the [place] from a country with a high rate of hepatitis A should be immunized.  Hepatitis B vaccine. Adults who wish to be protected from this disease, have certain high-risk conditions, may be exposed to blood or other infectious body fluids, are household contacts or sex partners of hepatitis B positive people, are clients or workers in certain care facilities, or  travel to or work in countries with a high rate of hepatitis B should be immunized.  Haemophilus influenzae type b (Hib) vaccine. A previously unvaccinated person with asplenia or sickle cell disease or having a scheduled splenectomy should receive 1 dose of Hib vaccine. Regardless of previous immunization, a recipient of a hematopoietic stem cell transplant should receive a 3-dose series 6-12 months after her successful transplant. Hib vaccine is not recommended for adults with HIV infection. Preventive Services / Frequency Ages 32 years and over  Blood pressure check.** / Every 1 to 2 years.  Lipid and cholesterol check.** / Every 5 years beginning at age 74 years.  Lung cancer screening. / Every year if you are aged 24-80 years and have a 30-pack-year history of smoking and currently smoke or have quit within the past 15 years. Yearly screening is stopped once you have quit smoking for at least 15 years or develop a health problem that would prevent you from having lung cancer treatment.  Clinical breast exam.** / Every year after age 37 years.  BRCA-related cancer risk assessment.** / For women who have family members with a BRCA-related cancer (breast, ovarian, tubal, or peritoneal cancers).  Mammogram.** / Every year beginning at age 90 years and continuing for as long as you are in good health. Consult with your health care provider.  Pap test.** / Every 3 years starting at age 69 years through age 70 or 62 years with 3 consecutive normal Pap tests. Testing can be stopped between 65 and 70 years with 3 consecutive normal Pap tests and no abnormal Pap or HPV tests in the past 10 years.  HPV screening.** / Every 3 years from ages 36 years through ages 35 or 51 years with a history of 3  consecutive normal Pap tests. Testing can be stopped between 65 and 70 years with 3 consecutive normal Pap tests and no abnormal Pap or HPV tests in the past 10 years.  Fecal occult blood test (FOBT) of  stool. / Every year beginning at age 34 years and continuing until age 37 years. You may not need to do this test if you get a colonoscopy every 10 years.  Flexible sigmoidoscopy or colonoscopy.** / Every 5 years for a flexible sigmoidoscopy or every 10 years for a colonoscopy beginning at age 37 years and continuing until age 67 years.  Hepatitis C blood test.** / For all people born from [birth year] through 1965 and any individual with known risks for hepatitis C.  Osteoporosis screening.** / A one-time screening for women ages 69 years and over and women at risk for fractures or osteoporosis.  Skin self-exam. / Monthly.  Influenza vaccine. / Every year.  Tetanus, diphtheria, and acellular pertussis (Tdap/Td) vaccine.** / 1 dose of Td every 10 years.  Varicella vaccine.** / Consult your health care provider.  Zoster vaccine.** / 1 dose for adults aged 28 years or older.  Pneumococcal 13-valent conjugate (PCV13) vaccine.** / Consult your health care provider.  Pneumococcal polysaccharide (PPSV23) vaccine.** / 1 dose for all adults aged 29 years and older.  Meningococcal vaccine.** / Consult your health care provider.  Hepatitis A vaccine.** / Consult your health care provider.  Hepatitis B vaccine.** / Consult your health care provider.  Haemophilus influenzae type b (Hib) vaccine.** / Consult your health care provider. ** Family history and personal history of risk and conditions may change your health care provider's recommendations. Document Released: 04/05/2001 Document Revised: 06/24/2013 Document Reviewed: 07/05/2010 [facility] Patient Information 2015 [place], [place]. This information is not intended to replace advice given to you by your health care provider. Make sure you discuss any questions you have with your health care provider.

## 2014-05-30 NOTE — Progress Notes (Signed)
Patient ID: Robin [name], female   DOB: [birth year], 74 y.o.   MRN: [id number]    Subjective:   Robin [name] is a 74 y.o. female who presents for an Subsequent Medicare Annual Wellness Visit.  Patient is pleasant today and has not complaints.  She currently resides by herself in an apartment building.   She remains active and enjoys playing bingo, crossword puzzles and visiting with friends and family.  Current Medications (verified) Outpatient Encounter Prescriptions as of 05/30/2014  Medication Sig  . aspirin 81 MG tablet Take 81 mg by mouth daily.  Marland Kitchen atorvastatin (LIPITOR) 80 MG tablet Take 1 tablet (80 mg total) by mouth daily.  . Calcium-Vitamin D (CALTRATE 600 PLUS-VIT D PO) Take 1 tablet by mouth daily.  . Cholecalciferol (VITAMIN D) 2000 UNITS tablet Take 4000IU on saturdays and sundays and 2000IU all other days.  Marland Kitchen denosumab (PROLIA) 60 MG/ML SOLN injection Bring to office for administration. Administer in upper arm, thigh, or abdomen  . feeding supplement, ENSURE ENLIVE, (ENSURE ENLIVE) LIQD Take 237 mLs by mouth daily. Dx:  Underweight  R63.8  . lisinopril (PRINIVIL,ZESTRIL) 10 MG tablet Take 1 tablet (10 mg total) by mouth daily.    Allergies (verified) Review of patient's allergies indicates no known allergies.   History: Past Medical History  Diagnosis Date  . Hypertension   . Vitamin D deficiency   . Osteoporosis     history of femur fracture  . Adenomatous colon polyp 2007  . Shingles   . Hyperlipidemia   . Cataract    Past Surgical History  Procedure Laterality Date  . Partial colectomy  2007  . Tubal ligation    . Eye surgery     Family History  Problem Relation Age of Onset  . Diabetes Mother   . Heart disease Mother   . Cancer Mother     patient unsure of type  . Hip fracture Mother   . Heart disease Father     antigioplasty  . Cancer Sister     lung  . COPD Sister   . Heart disease Sister   . Atrial fibrillation Sister   . Heart disease  Sister     stent in left leg  . Varicose Veins Sister   . Deep vein thrombosis Sister   . Heart disease Brother   . COPD Brother   . COPD Brother    Social History   Occupational History  . Not on file.   Social History Main Topics  . Smoking status: Former Smoker -- 1 years    Types: Cigarettes    Quit date: 11/01/1959  . Smokeless tobacco: Never Used  . Alcohol Use: 0.6 oz/week    1 Glasses of wine per week  . Drug Use: No  . Sexual Activity: No    Do you feel safe at home?  Yes  Dietary issues and exercise activities: Current Exercise Habits:: Home exercise routine;Structured exercise class, Type of exercise: walking (walks if not raining or if raining she goes to [employer]), Time (Minutes): 25, Frequency (Times/Week): 3, Weekly Exercise (Minutes/Week): 75, Intensity: Moderate  Current Dietary habits:  Patient tries to avoid fat due to hyperlipidemia (no eggs, dairy products)  Objective:    Today's Vitals   05/30/14 1223  BP: 145/77  Pulse: 68  Height: _0  (1.575 m)  Weight: 106 lb (48.081 kg)  PainSc: 0-No pain   Body mass index is 19.38 kg/(m^2).   last vitamin D was 24.5 (  07/31/2013)  Activities of Daily Living In your present state of health, do you have any difficulty performing the following activities: 05/30/2014 12/26/2013  Hearing? N N  Vision? N N  Difficulty concentrating or making decisions? N N  Walking or climbing stairs? N N  Dressing or bathing? N N  Doing errands, shopping? N N  Preparing Food and eating ? N -  Using the Toilet? N -  In the past six months, have you accidently leaked urine? N -  Do you have problems with loss of bowel control? N -  Managing your Medications? N -  Managing your Finances? N -  Housekeeping or managing your Housekeeping? N -    Are there smokers in your home (other than you)? No  Depression Screen PHQ 2/9 Scores 05/30/2014 03/04/2014 12/26/2013 07/31/2013  PHQ - 2 Score 0 3 0 0  PHQ- 9 Score - 5 - -      Fall Risk Fall Risk  05/30/2014 03/04/2014 12/26/2013 07/31/2013 05/01/2013  Falls in the past year? _0   Risk for fall due to : - - - - Impaired vision    Cognitive Function: MMSE - Mini Mental State Exam 05/30/2014  Orientation to time 5  Orientation to Place 5  Registration 3  Attention/ Calculation 5  Recall 3  Language- name 2 objects 2  Language- repeat 1  Language- follow 3 step command 3  Language- read & follow direction 1  Write a sentence 1  Copy design 1  Total score 30    Immunizations and Health Maintenance Immunization History  Administered Date(s) Administered  . Influenza,inj,Quad PF,36+ Mos 12/05/2012, 11/28/2013  . Pneumococcal Polysaccharide-23 12/05/2012   Health Maintenance Due  Topic Date Due  . MAMMOGRAM  01/25/2013  . PNA vac Low Risk Adult (2 of 2 - PCV13) 12/05/2013    Patient Care Team: Chevis Pretty, FNP as PCP - General (Nurse Practitioner)  Indicate any recent Medical Services you may have received from other than [facility] providers in the past year (date may be approximate).    Assessment:    Annual Wellness Visit  Osteoporosis -  Patient is due Prolia but when she went to pharmacy to pick up today they did not have in stock. Rx was [facility] to [facility] 05/15/2014 HTN Underweight - weight has decreased since last visit Low vitamin D - due recheck   Screening Tests Health Maintenance  Topic Date Due  . MAMMOGRAM  01/25/2013  . PNA vac Low Risk Adult (2 of 2 - PCV13) 12/05/2013  . ZOSTAVAX  06/26/2014 (Originally 09/20/2001)  . TETANUS/TDAP  06/26/2014 (Originally 09/20/1960)  . INFLUENZA VACCINE  09/22/2014  . COLON CANCER SCREENING ANNUAL FOBT  03/07/2015  . COLONOSCOPY  07/22/2016  . DEXA SCAN  Completed        Plan:   During the course of the visit Hanako was educated and counseled about the following appropriate screening and preventive services:   Vaccines to include Pneumoccal, Influenza, Hepatitis B, Td,  Zostavax - patient received Prevnar 13 and Boostrix in office today.  Will get Zostavax at next visit greater than 1 month away.  Colorectal cancer screening - referral made today  Cardiovascular disease screening - lipid panel ordered today  Diabetes screening - checking CMP / FBG today  Bone Denisty / Osteoporosis Screening - UTD.  Patient to RTC next week to have Prolia administered.   Mammogram - appt made for 07/16/14   Glaucoma screening / Diabetic Eye  Exam - due, patient reminded past due  Nutrition counseling - recommended protein rich Ensure - drink 1 can daily to help with weight and overall nutrition.   Advanced Directives - patient provided copy today - will have scanned into records.  Start lisinopril 27m take 1 tablet daily  Orders Placed This Encounter  Procedures  . CMP14+EGFR  . Vit D  25 hydroxy (rtn osteoporosis monitoring)  . Ambulatory referral to Gastroenterology    Referral Priority:  Routine    Referral Type:  Consultation    Referral Reason:  Specialty Services Required    Requested Specialty:  Gastroenterology    Number of Visits Requested:  1   Patient Instructions (the written plan) were given to the patient.   [facility]herre Robins [facility]  05/30/2014

## 2014-05-30 NOTE — Addendum Note (Signed)
Addended by: Marylin Crosby on: 05/30/2014 05:21 PM   Modules accepted: Orders

## 2014-05-31 LAB — CMP14+EGFR
ALBUMIN: 4 g/dL (ref 3.5–4.8)
ALT: 6 IU/L (ref 0–32)
AST: 11 IU/L (ref 0–40)
Albumin/Globulin Ratio: 1.7 (ref 1.1–2.5)
Alkaline Phosphatase: 59 IU/L (ref 39–117)
BUN / CREAT RATIO: 10 — AB (ref 11–26)
BUN: 8 mg/dL (ref 8–27)
Bilirubin Total: 0.7 mg/dL (ref 0.0–1.2)
CO2: 27 mmol/L (ref 18–29)
Calcium: 9 mg/dL (ref 8.7–10.3)
Chloride: 102 mmol/L (ref 97–108)
Creatinine, Ser: 0.78 mg/dL (ref 0.57–1.00)
GFR calc Af Amer: 88 mL/min/{1.73_m2} (ref >59)
GFR calc non Af Amer: 76 mL/min/{1.73_m2} (ref >59)
GLOBULIN, TOTAL: 2.4 g/dL (ref 1.5–4.5)
Glucose: 86 mg/dL (ref 65–99)
POTASSIUM: 4 mmol/L (ref 3.5–5.2)
Sodium: 143 mmol/L (ref 134–144)
TOTAL PROTEIN: 6.4 g/dL (ref 6.0–8.5)

## 2014-05-31 LAB — VITAMIN D 25 HYDROXY (VIT D DEFICIENCY, FRACTURES): VIT D 25 HYDROXY: 35.5 ng/mL (ref 30.0–100.0)

## 2014-06-02 ENCOUNTER — Telehealth: Payer: Self-pay | Admitting: Nurse Practitioner

## 2014-06-02 ENCOUNTER — Telehealth: Payer: Self-pay | Admitting: Pharmacist

## 2014-06-02 NOTE — Telephone Encounter (Signed)
i didn't call patient but I think either the automatic system or someone else call from our office called to remind about appt Thursday.  Also called [facility] and verified that they have ordered Prolia and will have for patient tomorrow.

## 2014-06-02 NOTE — Telephone Encounter (Signed)
-----   Message from [place], [place] sent at 06/02/2014  8:08 AM EDT ----- Labs are WNL.  Vitamin D now is normal.  Continue current vitamin D supplementation.  Please notify patient of results.

## 2014-06-02 NOTE — Telephone Encounter (Signed)
No phone number attached to this encounter.  Use another encounter sent for the same patient earlier today.

## 2014-06-05 ENCOUNTER — Encounter: Payer: Self-pay | Admitting: Nurse Practitioner

## 2014-06-05 ENCOUNTER — Ambulatory Visit (INDEPENDENT_AMBULATORY_CARE_PROVIDER_SITE_OTHER): Payer: Medicare Other | Admitting: Pharmacist

## 2014-06-05 ENCOUNTER — Telehealth: Payer: Self-pay | Admitting: Pharmacist

## 2014-06-05 ENCOUNTER — Encounter: Payer: Self-pay | Admitting: Internal Medicine

## 2014-06-05 DIAGNOSIS — M810 Age-related osteoporosis without current pathological fracture: Secondary | ICD-10-CM

## 2014-06-05 MED ORDER — DENOSUMAB 60 MG/ML ~~LOC~~ SOLN
60.0000 mg | Freq: Once | SUBCUTANEOUS | Status: AC
  Administered 2014-06-05: 60 mg via SUBCUTANEOUS
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
## 2014-06-05 NOTE — Patient Instructions (Signed)

## 2014-06-05 NOTE — Telephone Encounter (Signed)
Patient aware letter up front to picked up.

## 2014-06-05 NOTE — Telephone Encounter (Signed)
Patient was notified of results at appt.

## 2014-06-05 NOTE — Telephone Encounter (Signed)
c Letter ready to pick up but may be to late to get out of it.

## 2014-06-27 ENCOUNTER — Other Ambulatory Visit: Payer: Self-pay

## 2014-06-27 ENCOUNTER — Encounter: Payer: Self-pay | Admitting: Gastroenterology

## 2014-06-27 ENCOUNTER — Ambulatory Visit (INDEPENDENT_AMBULATORY_CARE_PROVIDER_SITE_OTHER): Payer: Medicare Other | Admitting: Gastroenterology

## 2014-06-27 VITALS — BP 131/72 | HR 63 | Temp 97.4°F | Ht 62.0 in | Wt 107.4 lb

## 2014-06-27 DIAGNOSIS — Z860100 Personal history of colon polyps, unspecified: Secondary | ICD-10-CM

## 2014-06-27 MED ORDER — PEG 3350-KCL-NA BICARB-NACL 420 G PO SOLR: 4000.0000 mL | Freq: Once | ORAL | Status: DC

## 2014-06-27 NOTE — Patient Instructions (Signed)
We have scheduled you for a colonoscopy with Dr. Rourk in the near future.  Further recommendations to follow!   

## 2014-07-06 ENCOUNTER — Encounter: Payer: Self-pay | Admitting: Gastroenterology

## 2014-07-06 NOTE — Progress Notes (Signed)
Primary Care Physician:  Chevis Pretty, FNP Primary Gastroenterologist:  Dr. Gala Romney   Chief Complaint  Patient presents with  . Colonoscopy    HPI:   Robin [name] is a 74 y.o. female presenting today at the request of her PCP secondary to need for surveillance colonoscopy. 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 Georgette Dover. 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 Pathology returned with focal high grade dysplasia. She underwent an additional colon resection of this new tubulovillous adenoma at the anastomosis in 2008. Her last full colonoscopy was in 2008, prior to the second resection by Dr. Georgette Dover. She is quite overdue for surveillance.   Notes a BM 2-3 times per day but no diarrhea. No rectal bleeding, abdominal pain, or any upper GI symptoms. No GI complaints.    Past Medical History  Diagnosis Date  . Hypertension   . Vitamin D deficiency   . Osteoporosis     history of femur fracture  . 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 Shingles   . Hyperlipidemia   . Cataract     Past Surgical History  Procedure Laterality Date  . Partial colectomy  2007    due to tubulovillous adenoma  . Tubal ligation    . Eye surgery    . Laparoscopic lysis of adhesions    . 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 Colonoscopy  April 2007    Dr. Sharlett Iles: 5cm circumferential fungating tumor at the hepatic flexure, path tubulovillous adenoma.   . Colonoscopy  Jan 02, 2006    Dr. Sharlett Iles: large spreading tubulovillous adenoma distal to  ileocolonic anastomosis. Multiple polyps around anastomosis of different sizes,   . Colonoscopy  2008    Dr. Sharlett Iles. Referred to Dr. Georgette Dover for resection    Current Outpatient Prescriptions  Medication Sig Dispense Refill  . atorvastatin (LIPITOR) 80 MG tablet Take 1 tablet (80 mg total) by mouth daily. 900 tablet 1  . Calcium-Vitamin D (CALTRATE 600 PLUS-VIT D PO) Take 1 tablet by mouth daily.    . Cholecalciferol (VITAMIN D) 2000 UNITS tablet Take 4000IU on saturdays and sundays and 2000IU all other days.    Marland Kitchen denosumab (PROLIA) 60 MG/ML SOLN injection Bring to office for administration. Administer in upper arm, thigh, or abdomen 1 mL 1  . lisinopril (PRINIVIL,ZESTRIL) 10 MG tablet Take 1 tablet (10 mg total) by mouth daily. 90 tablet 1  . aspirin EC 81 MG tablet Take 81 mg by mouth daily.    . polyethylene glycol-electrolytes (NULYTELY/GOLYTELY) 420 G solution Take 4,000 mLs by mouth once. 4000 mL 0   No current facility-administered medications for this visit.    Allergies as of 06/27/2014  . (No Known Allergies)    Family History  Problem Relation Age of Onset  . Diabetes Mother   . Heart disease Mother   . Cancer Mother     patient unsure of type  . Hip fracture Mother   . Heart disease Father     antigioplasty  . Cancer Sister     lung  . COPD  Sister   . Heart disease Sister   . Atrial fibrillation Sister   . Heart disease Sister     stent in left leg  . Varicose Veins Sister   . Deep vein thrombosis Sister   . Heart disease Brother   . COPD Brother   . COPD Brother   . Colon cancer Neg Hx     History   Social History  . Marital Status: Widowed    Spouse Name: N/A  . Number of Children: N/A  . Years of Education: N/A   Occupational History  . Not on file.   Social History Main Topics  . Smoking status: Former Smoker -- 1 years    Types: Cigarettes    Quit date: 11/01/1959  . Smokeless tobacco: Never Used  . Alcohol Use: 0.6 oz/week    1 Glasses  of wine per week  . Drug Use: No  . Sexual Activity: No   Other Topics Concern  . Not on file   Social History Narrative    Review of Systems: As mentioned in HPI  Physical Exam: BP 131/72 mmHg  Pulse 63  Temp(Src) 97.4 F (36.3 C) (Oral)  Ht 5\' 2"  (1.575 m)  Wt 107 lb 6.4 oz (48.716 kg)  BMI 19.64 kg/m2 General:   Alert and oriented. Pleasant and cooperative. Well-nourished and well-developed.  Head:  Normocephalic and atraumatic. Eyes:  Without icterus, sclera clear and conjunctiva pink.  Ears:  Normal auditory acuity. Nose:  No deformity, discharge,  or lesions. Mouth:  No deformity or lesions, oral mucosa pink.  Lungs:  Clear to auscultation bilaterally. No wheezes, rales, or rhonchi. No distress.  Heart:  S1, S2 present without murmurs appreciated.  Abdomen:  +BS, soft, non-tender and non-distended. No HSM noted. No guarding or rebound. No masses appreciated.  Rectal:  Deferred  Msk:  Symmetrical without gross deformities. Normal posture. Extremities:  Without clubbing or edema. Neurologic:  Alert and  oriented x4;  grossly normal neurologically. Skin:  Intact without significant lesions or rashes. Psych:  Alert and cooperative. Normal mood and affect.

## 2014-07-08 ENCOUNTER — Encounter: Payer: Self-pay | Admitting: Gastroenterology

## 2014-07-08 NOTE — Assessment & Plan Note (Signed)
74 year old female with history of tubulovillous adenomas noted in 2007 and 2008, requiring colectomy in 2007 and then additional resection in 2008 of adenoma at the anastomosis. Overdue for surveillance at this time. She has no concerning lower or upper GI symptoms.   Proceed with TCS with Dr. Gala Romney in near future: the risks, benefits, and alternatives have been discussed with the patient in detail. The patient states understanding and desires to proceed.

## 2014-07-09 NOTE — Progress Notes (Signed)
cc'ed to pcp °

## 2014-07-18 ENCOUNTER — Ambulatory Visit (HOSPITAL_COMMUNITY)
Admission: RE | Admit: 2014-07-18 | Discharge: 2014-07-18 | Disposition: A | Discharge status: Home / Self Care | Payer: Medicare Other | Source: Ambulatory Visit | Attending: Internal Medicine | Admitting: Internal Medicine

## 2014-07-18 ENCOUNTER — Encounter (HOSPITAL_COMMUNITY): Payer: Self-pay | Admitting: *Deleted

## 2014-07-18 ENCOUNTER — Encounter (HOSPITAL_COMMUNITY)
Admission: RE | Disposition: A | Discharge status: Home / Self Care | Payer: Self-pay | Source: Ambulatory Visit | Attending: Internal Medicine

## 2014-07-18 DIAGNOSIS — E559 Vitamin D deficiency, unspecified: Secondary | ICD-10-CM | POA: Diagnosis not present

## 2014-07-18 DIAGNOSIS — Z1211 Encounter for screening for malignant neoplasm of colon: Secondary | ICD-10-CM | POA: Insufficient documentation

## 2014-07-18 DIAGNOSIS — Z7982 Long term (current) use of aspirin: Secondary | ICD-10-CM | POA: Insufficient documentation

## 2014-07-18 DIAGNOSIS — I10 Essential (primary) hypertension: Secondary | ICD-10-CM | POA: Diagnosis not present

## 2014-07-18 DIAGNOSIS — Z86010 Personal history of colonic polyps: Secondary | ICD-10-CM | POA: Diagnosis not present

## 2014-07-18 DIAGNOSIS — M810 Age-related osteoporosis without current pathological fracture: Secondary | ICD-10-CM | POA: Diagnosis not present

## 2014-07-18 DIAGNOSIS — E785 Hyperlipidemia, unspecified: Secondary | ICD-10-CM | POA: Diagnosis not present

## 2014-07-18 DIAGNOSIS — Z9049 Acquired absence of other specified parts of digestive tract: Secondary | ICD-10-CM | POA: Diagnosis not present

## 2014-07-18 HISTORY — PX: COLONOSCOPY: SHX5424

## 2014-07-18 HISTORY — DX: Unspecified atrial fibrillation: I48.91

## 2014-07-18 SURGERY — COLONOSCOPY
Anesthesia: Moderate Sedation

## 2014-07-18 MED ORDER — SODIUM CHLORIDE 0.9 % IV SOLN
INTRAVENOUS | Status: DC
  Administered 2014-07-18: 12:00:00 via INTRAVENOUS

## 2014-07-18 MED ORDER — MEPERIDINE HCL 100 MG/ML IJ SOLN
INTRAMUSCULAR | Status: AC
  Filled 2014-07-18: qty 2

## 2014-07-18 MED ORDER — MEPERIDINE HCL 100 MG/ML IJ SOLN
INTRAMUSCULAR | Status: DC | PRN
  Administered 2014-07-18: 25 mg via INTRAVENOUS

## 2014-07-18 MED ORDER — ONDANSETRON HCL 4 MG/2ML IJ SOLN
INTRAMUSCULAR | Status: AC
  Filled 2014-07-18: qty 2

## 2014-07-18 MED ORDER — STERILE WATER FOR IRRIGATION IR SOLN
Status: DC | PRN
  Administered 2014-07-18: 13:00:00

## 2014-07-18 MED ORDER — MIDAZOLAM HCL 5 MG/5ML IJ SOLN
INTRAMUSCULAR | Status: AC
  Filled 2014-07-18: qty 10

## 2014-07-18 MED ORDER — ONDANSETRON HCL 4 MG/2ML IJ SOLN
INTRAMUSCULAR | Status: DC | PRN
  Administered 2014-07-18: 4 mg via INTRAVENOUS

## 2014-07-18 MED ORDER — MIDAZOLAM HCL 5 MG/5ML IJ SOLN
INTRAMUSCULAR | Status: DC | PRN
  Administered 2014-07-18: 2 mg via INTRAVENOUS
  Administered 2014-07-18 (×2): 1 mg via INTRAVENOUS

## 2014-07-18 NOTE — Discharge Instructions (Signed)
°  Colonoscopy Discharge Instructions  Read the instructions outlined below and refer to this sheet in the next few weeks. These discharge instructions provide you with general information on caring for yourself after you leave the hospital. Your doctor may also give you specific instructions. While your treatment has been planned according to the most current medical practices available, unavoidable complications occasionally occur. If you have any problems or questions after discharge, call Dr. Gala Romney at [phone]. ACTIVITY  You may resume your regular activity, but move at a slower pace for the next 24 hours.   Take frequent rest periods for the next 24 hours.   Walking will help get rid of the air and reduce the bloated feeling in your belly (abdomen).   No driving for 24 hours (because of the medicine (anesthesia) used during the test).    Do not sign any important legal documents or operate any machinery for 24 hours (because of the anesthesia used during the test).  NUTRITION  Drink plenty of fluids.   You may resume your normal diet as instructed by your doctor.   Begin with a light meal and progress to your normal diet. Heavy or fried foods are harder to digest and may make you feel sick to your stomach (nauseated).   Avoid alcoholic beverages for 24 hours or as instructed.  MEDICATIONS  You may resume your normal medications unless your doctor tells you otherwise.  WHAT YOU CAN EXPECT TODAY  Some feelings of bloating in the abdomen.   Passage of more gas than usual.   Spotting of blood in your stool or on the toilet paper.  IF YOU HAD POLYPS REMOVED DURING THE COLONOSCOPY:  No aspirin products for 7 days or as instructed.   No alcohol for 7 days or as instructed.   Eat a soft diet for the next 24 hours.  FINDING OUT THE RESULTS OF YOUR TEST Not all test results are available during your visit. If your test results are not back during the visit, make an appointment  with your caregiver to find out the results. Do not assume everything is normal if you have not heard from your caregiver or the medical facility. It is important for you to follow up on all of your test results.  SEEK IMMEDIATE MEDICAL ATTENTION IF:  You have more than a spotting of blood in your stool.   Your belly is swollen (abdominal distention).   You are nauseated or vomiting.   You have a temperature over 101.    Return for one more colonoscopy in 5 years only if overall health permits.  You have abdominal pain or discomfort that is severe or gets worse throughout the day.

## 2014-07-18 NOTE — Op Note (Signed)
[facility] [address] [place], [address]   COLONOSCOPY PROCEDURE REPORT  PATIENT: Robin, [name]  MR#: [id number] BIRTHDATE: 10/20/1940 , 73  yrs. old GENDER: female ENDOSCOPIST: R.  Garfield Cornea, MD FACP [facility] REFERRED TG:GYIR Rockne Coons, N.P.  Storm Frisk, M.D. PROCEDURE DATE:  08-12-2014 PROCEDURE:   Colonoscopy, surveillance INDICATIONS:    History of high-grade adenomas requiring segmental resection, 2007 and 2008; overdue for surveillance examination. MEDICATIONS: Versed 4 mg IV and Demerol 25 mg IV in divided doses. Zofran 4 mg IV. ASA CLASS:       Class II  CONSENT: The risks, benefits, alternatives and imponderables including but not limited to bleeding, perforation as well as the possibility of a missed lesion have been reviewed.  The potential for biopsy, lesion removal, etc. have also been discussed. Questions have been answered.  All parties agreeable.  Please see the history and physical in the medical record for more information.  DESCRIPTION OF PROCEDURE:   After the risks benefits and alternatives of the procedure were thoroughly explained, informed consent was obtained.  The digital rectal exam revealed no abnormalities of the rectum.   The [id number] ([id number])  endoscope was introduced through the anus and advanced to the surgical anastomosis. No adverse events experienced.   The quality of the prep was adequate  The instrument was then slowly withdrawn as the colon was fully examined.      COLON FINDINGS: Normal-appearing rectal mucosa.  Patient had a marginally adequate preparation.  Some viscous stool lining the colon had to be washed and suctioned to gain adequate visualization.  Surgical anastomosis identified.  The distal 10-15 cm of the ileum was inspected and appeared normal; the anastomosis appears normal as did the remainder of the colonic mucosa. Retroflexion was performed. .  Withdrawal time=6 minutes 0 seconds.  The  scope was withdrawn and the procedure completed. COMPLICATIONS: There were no immediate complications.  ENDOSCOPIC IMPRESSION: Status post prior segmental colon resection. Residual colonic and rectal mucosa appeared normal.  RECOMMENDATIONS: Would consider one more surveillance colonoscopy in 5 years only if overall health permits.  eSigned:  R. Garfield Cornea, MD Rosalita Chessman [facility] 08-12-14 1:18 PM   cc:  CPT CODES: ICD CODES:  The ICD and CPT codes recommended by this software are interpretations from the data that the clinical staff has captured with the software.  The verification of the translation of this report to the ICD and CPT codes and modifiers is the sole responsibility of the health care institution and practicing physician where this report was generated.  [employer] will not be held responsible for the validity of the ICD and CPT codes included on this report.  AMA assumes no liability for data contained or not contained herein. CPT is a Designer, television/film set of the [employer].

## 2014-07-18 NOTE — Interval H&P Note (Signed)
History and Physical Interval Note:  07/18/2014 12:41 PM  Robin [name]  has presented today for surgery, with the diagnosis of hx of colon polyps  The various methods of treatment have been discussed with the patient and family. After consideration of risks, benefits and other options for treatment, the patient has consented to  Procedure(s) with comments: COLONOSCOPY (N/A) - 1230 as a surgical intervention .  The patient's history has been reviewed, patient examined, no change in status, stable for surgery.  I have reviewed the patient's chart and labs.  Questions were answered to the patient's satisfaction.     Laurine Kuyper  No change. Surveillance colonoscopy per plan.The risks, benefits, limitations, alternatives and imponderables have been reviewed with the patient. Questions have been answered. All parties are agreeable.

## 2014-07-18 NOTE — H&P (View-Only) (Signed)
Primary Care Physician:  Chevis Pretty, FNP Primary Gastroenterologist:  Dr. Gala Romney   Chief Complaint  Patient presents with  . Colonoscopy    HPI:   Robin [name] is a 74 y.o. female presenting today at the request of her PCP secondary to need for surveillance colonoscopy. 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 Georgette Dover. 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 Pathology returned with focal high grade dysplasia. She underwent an additional colon resection of this new tubulovillous adenoma at the anastomosis in 2008. Her last full colonoscopy was in 2008, prior to the second resection by Dr. Georgette Dover. She is quite overdue for surveillance.   Notes a BM 2-3 times per day but no diarrhea. No rectal bleeding, abdominal pain, or any upper GI symptoms. No GI complaints.    Past Medical History  Diagnosis Date  . Hypertension   . Vitamin D deficiency   . Osteoporosis     history of femur fracture  . 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 Shingles   . Hyperlipidemia   . Cataract     Past Surgical History  Procedure Laterality Date  . Partial colectomy  2007    due to tubulovillous adenoma  . Tubal ligation    . Eye surgery    . Laparoscopic lysis of adhesions    . 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 Colonoscopy  April 2007    Dr. Sharlett Iles: 5cm circumferential fungating tumor at the hepatic flexure, path tubulovillous adenoma.   . Colonoscopy  Jan 02, 2006    Dr. Sharlett Iles: large spreading tubulovillous adenoma distal to  ileocolonic anastomosis. Multiple polyps around anastomosis of different sizes,   . Colonoscopy  2008    Dr. Sharlett Iles. Referred to Dr. Georgette Dover for resection    Current Outpatient Prescriptions  Medication Sig Dispense Refill  . atorvastatin (LIPITOR) 80 MG tablet Take 1 tablet (80 mg total) by mouth daily. 900 tablet 1  . Calcium-Vitamin D (CALTRATE 600 PLUS-VIT D PO) Take 1 tablet by mouth daily.    . Cholecalciferol (VITAMIN D) 2000 UNITS tablet Take 4000IU on saturdays and sundays and 2000IU all other days.    Marland Kitchen denosumab (PROLIA) 60 MG/ML SOLN injection Bring to office for administration. Administer in upper arm, thigh, or abdomen 1 mL 1  . lisinopril (PRINIVIL,ZESTRIL) 10 MG tablet Take 1 tablet (10 mg total) by mouth daily. 90 tablet 1  . aspirin EC 81 MG tablet Take 81 mg by mouth daily.    . polyethylene glycol-electrolytes (NULYTELY/GOLYTELY) 420 G solution Take 4,000 mLs by mouth once. 4000 mL 0   No current facility-administered medications for this visit.    Allergies as of 06/27/2014  . (No Known Allergies)    Family History  Problem Relation Age of Onset  . Diabetes Mother   . Heart disease Mother   . Cancer Mother     patient unsure of type  . Hip fracture Mother   . Heart disease Father     antigioplasty  . Cancer Sister     lung  . COPD  Sister   . Heart disease Sister   . Atrial fibrillation Sister   . Heart disease Sister     stent in left leg  . Varicose Veins Sister   . Deep vein thrombosis Sister   . Heart disease Brother   . COPD Brother   . COPD Brother   . Colon cancer Neg Hx     History   Social History  . Marital Status: Widowed    Spouse Name: N/A  . Number of Children: N/A  . Years of Education: N/A   Occupational History  . Not on file.   Social History Main Topics  . Smoking status: Former Smoker -- 1 years    Types: Cigarettes    Quit date: 11/01/1959  . Smokeless tobacco: Never Used  . Alcohol Use: 0.6 oz/week    1 Glasses  of wine per week  . Drug Use: No  . Sexual Activity: No   Other Topics Concern  . Not on file   Social History Narrative    Review of Systems: As mentioned in HPI  Physical Exam: BP 131/72 mmHg  Pulse 63  Temp(Src) 97.4 F (36.3 C) (Oral)  Ht 5\' 2"  (1.575 m)  Wt 107 lb 6.4 oz (48.716 kg)  BMI 19.64 kg/m2 General:   Alert and oriented. Pleasant and cooperative. Well-nourished and well-developed.  Head:  Normocephalic and atraumatic. Eyes:  Without icterus, sclera clear and conjunctiva pink.  Ears:  Normal auditory acuity. Nose:  No deformity, discharge,  or lesions. Mouth:  No deformity or lesions, oral mucosa pink.  Lungs:  Clear to auscultation bilaterally. No wheezes, rales, or rhonchi. No distress.  Heart:  S1, S2 present without murmurs appreciated.  Abdomen:  +BS, soft, non-tender and non-distended. No HSM noted. No guarding or rebound. No masses appreciated.  Rectal:  Deferred  Msk:  Symmetrical without gross deformities. Normal posture. Extremities:  Without clubbing or edema. Neurologic:  Alert and  oriented x4;  grossly normal neurologically. Skin:  Intact without significant lesions or rashes. Psych:  Alert and cooperative. Normal mood and affect.

## 2014-07-22 ENCOUNTER — Encounter (HOSPITAL_COMMUNITY): Payer: Self-pay | Admitting: Internal Medicine

## 2014-10-02 ENCOUNTER — Telehealth: Payer: Self-pay | Admitting: Pharmacist

## 2014-10-02 DIAGNOSIS — E785 Hyperlipidemia, unspecified: Secondary | ICD-10-CM

## 2014-10-02 MED ORDER — LISINOPRIL 10 MG PO TABS: 10.0000 mg | ORAL_TABLET | Freq: Every day | ORAL | Status: DC

## 2014-10-02 MED ORDER — ATORVASTATIN CALCIUM 80 MG PO TABS: 80.0000 mg | ORAL_TABLET | Freq: Every day | ORAL | Status: DC

## 2014-10-02 NOTE — Telephone Encounter (Signed)
Rx for #30 days sent to [facility] and appt scheduled for follow up with PCP for 10/23/14 at 10:15am

## 2014-10-23 ENCOUNTER — Encounter: Payer: Self-pay | Admitting: Nurse Practitioner

## 2014-10-23 ENCOUNTER — Ambulatory Visit (INDEPENDENT_AMBULATORY_CARE_PROVIDER_SITE_OTHER): Payer: Medicare Other | Admitting: Nurse Practitioner

## 2014-10-23 ENCOUNTER — Telehealth: Payer: Self-pay | Admitting: Nurse Practitioner

## 2014-10-23 ENCOUNTER — Ambulatory Visit (INDEPENDENT_AMBULATORY_CARE_PROVIDER_SITE_OTHER): Payer: Medicare Other

## 2014-10-23 VITALS — BP 136/86 | HR 75 | Temp 97.0°F | Ht 62.0 in | Wt 103.0 lb

## 2014-10-23 DIAGNOSIS — I10 Essential (primary) hypertension: Secondary | ICD-10-CM

## 2014-10-23 DIAGNOSIS — E785 Hyperlipidemia, unspecified: Secondary | ICD-10-CM | POA: Diagnosis not present

## 2014-10-23 DIAGNOSIS — M810 Age-related osteoporosis without current pathological fracture: Secondary | ICD-10-CM

## 2014-10-23 DIAGNOSIS — E559 Vitamin D deficiency, unspecified: Secondary | ICD-10-CM

## 2014-10-23 MED ORDER — LISINOPRIL 10 MG PO TABS: 10.0000 mg | ORAL_TABLET | Freq: Every day | ORAL | Status: DC

## 2014-10-23 MED ORDER — ATORVASTATIN CALCIUM 80 MG PO TABS
80.0000 mg | ORAL_TABLET | Freq: Every day | ORAL | Status: DC
Start: 2014-10-23 — End: 2014-12-04

## 2014-10-23 MED ORDER — ENSURE PLUS PO LIQD: 1.0000 | Freq: Every day | ORAL | Status: DC

## 2014-10-23 MED ORDER — BLOOD PRESSURE MONITOR KIT: PACK | Status: DC

## 2014-10-23 NOTE — Patient Instructions (Signed)

## 2014-10-23 NOTE — Telephone Encounter (Signed)
I will print rx for patient's PCP and given to Home health / Rx Department to send to [facility].

## 2014-10-23 NOTE — Progress Notes (Signed)
Subjective:    Patient ID: Robin [name], female    DOB: 1941/01/23, 74 y.o.   MRN: [id number]   Patient here today for follow up of chronic medical problems. Recently diagnosed with hypertension when he for Dexa scan and was started on lisinopril- no c/o side effects.   Hyperlipidemia This is a chronic problem. The current episode started more than 1 year ago. The problem is controlled. Recent lipid tests were reviewed and are normal. She has no history of diabetes, hypothyroidism or obesity. Pertinent negatives include no chest pain, myalgias or shortness of breath. The current treatment provides moderate improvement of lipids. Compliance problems include adherence to diet and adherence to exercise.  Risk factors for coronary artery disease include dyslipidemia.  Hypertension This is a new problem. The current episode started more than 1 month ago. The problem is controlled. Pertinent negatives include no chest pain, malaise/fatigue, palpitations, peripheral edema or shortness of breath. Risk factors for coronary artery disease include dyslipidemia and post-menopausal state. Past treatments include ACE inhibitors. The current treatment provides significant improvement. There is no history of CAD/MI or CVA.  Vitamin D DEFICIENCY Vitamin D 2000 Iu OTC daily- tolerating well osteoporosis Calcium and vitamin d daily- no c/o back pain.     Review of Systems  Constitutional: Negative for malaise/fatigue, appetite change and unexpected weight change.  Respiratory: Negative for shortness of breath.   Cardiovascular: Negative for chest pain, palpitations and leg swelling.  Genitourinary: Negative.   Musculoskeletal: Negative for myalgias.  Neurological: Negative.   All other systems reviewed and are negative.      Objective:   Physical Exam  Constitutional: She is oriented to person, place, and time. She appears well-developed and well-nourished.  HENT:  Nose: Nose normal.   Mouth/Throat: Oropharynx is clear and moist.  Eyes: EOM are normal.  Neck: Trachea normal, normal range of motion and full passive range of motion without pain. Neck supple. No JVD present. Carotid bruit is not present. No thyromegaly present.  Cardiovascular: Normal rate, regular rhythm, normal heart sounds and intact distal pulses.  Exam reveals no gallop and no friction rub.   No murmur heard. Pulmonary/Chest: Effort normal and breath sounds normal.  Abdominal: Soft. Bowel sounds are normal. She exhibits no distension and no mass. There is no tenderness.  Musculoskeletal: Normal range of motion.  Lymphadenopathy:    She has no cervical adenopathy.  Neurological: She is alert and oriented to person, place, and time. She has normal reflexes.  Skin: Skin is warm and dry.  Psychiatric: She has a normal mood and affect. Her behavior is normal. Judgment and thought content normal.     BP 136/86 mmHg  Pulse 75  Temp(Src) 97 F (36.1 C) (Oral)  Ht $R'5\' 2"'qg$  (1.575 m)  Wt 103 lb (46.72 kg)  BMI 18.83 kg/m2  EKG- NSR-[name] Done, FNP   CHest X ray- no cardiopulmonary disease-Preliminary reading by Ronnald Collum, FNP  [facility]     Assessment & Plan:   1. Osteoporosis Weight bearing exercises  2. Hyperlipidemia Low fta diet - Lipid panel - atorvastatin (LIPITOR) 80 MG tablet; Take 1 tablet (80 mg total) by mouth daily.  Dispense: 30 tablet; Refill: 0  3. Vitamin D insufficiency  4. Essential hypertension Do not add salt to diet - CMP14+EGFR - DG Chest 2 View; Future - EKG 12-Lead - lisinopril (PRINIVIL,ZESTRIL) 10 MG tablet; Take 1 tablet (10 mg total) by mouth daily.  Dispense: 30 tablet; Refill: 0    Labs  pending Health maintenance reviewed Diet and exercise encouraged Continue all meds Follow up  In 3 months   [place], FNP

## 2014-10-24 LAB — CMP14+EGFR
A/G RATIO: 2.2 (ref 1.1–2.5)
ALBUMIN: 4.3 g/dL (ref 3.5–4.8)
ALT: 14 IU/L (ref 0–32)
AST: 23 IU/L (ref 0–40)
Alkaline Phosphatase: 64 IU/L (ref 39–117)
BUN/Creatinine Ratio: 8 — ABNORMAL LOW (ref 11–26)
BUN: 7 mg/dL — ABNORMAL LOW (ref 8–27)
Bilirubin Total: 0.9 mg/dL (ref 0.0–1.2)
CALCIUM: 9.6 mg/dL (ref 8.7–10.3)
CO2: 27 mmol/L (ref 18–29)
Chloride: 100 mmol/L (ref 97–108)
Creatinine, Ser: 0.83 mg/dL (ref 0.57–1.00)
GFR, EST AFRICAN AMERICAN: 81 mL/min/{1.73_m2} (ref >59)
GFR, EST NON AFRICAN AMERICAN: 70 mL/min/{1.73_m2} (ref >59)
Globulin, Total: 2 g/dL (ref 1.5–4.5)
Glucose: 86 mg/dL (ref 65–99)
Potassium: 3.2 mmol/L — ABNORMAL LOW (ref 3.5–5.2)
Sodium: 143 mmol/L (ref 134–144)
TOTAL PROTEIN: 6.3 g/dL (ref 6.0–8.5)

## 2014-10-24 LAB — LIPID PANEL
CHOL/HDL RATIO: 2.6 ratio (ref 0.0–4.4)
Cholesterol, Total: 122 mg/dL (ref 100–199)
HDL: 47 mg/dL (ref >39)
LDL Calculated: 58 mg/dL (ref 0–99)
Triglycerides: 85 mg/dL (ref 0–149)
VLDL Cholesterol Cal: 17 mg/dL (ref 5–40)

## 2014-10-24 NOTE — Telephone Encounter (Signed)
Faxed to [place]

## 2014-10-25 ENCOUNTER — Encounter: Payer: Self-pay | Admitting: *Deleted

## 2014-10-30 ENCOUNTER — Telehealth: Payer: Self-pay | Admitting: Pharmacist

## 2014-10-30 NOTE — Telephone Encounter (Signed)
Patient asked about Rx for ensure nad BP cuff.   Advised her that they have already been sent to [facility].  She is going to check with them.

## 2014-11-30 ENCOUNTER — Other Ambulatory Visit: Payer: Self-pay | Admitting: Pharmacist

## 2014-11-30 MED ORDER — DENOSUMAB 60 MG/ML ~~LOC~~ SOLN: SUBCUTANEOUS | Status: DC

## 2014-12-04 ENCOUNTER — Encounter: Payer: Self-pay | Admitting: Pharmacist

## 2014-12-04 ENCOUNTER — Ambulatory Visit (INDEPENDENT_AMBULATORY_CARE_PROVIDER_SITE_OTHER): Payer: Medicare Other | Admitting: Pharmacist

## 2014-12-04 VITALS — BP 118/72 | HR 72 | Ht 62.0 in | Wt 106.0 lb

## 2014-12-04 DIAGNOSIS — E785 Hyperlipidemia, unspecified: Secondary | ICD-10-CM | POA: Diagnosis not present

## 2014-12-04 DIAGNOSIS — Z23 Encounter for immunization: Secondary | ICD-10-CM | POA: Diagnosis not present

## 2014-12-04 DIAGNOSIS — I10 Essential (primary) hypertension: Secondary | ICD-10-CM | POA: Diagnosis not present

## 2014-12-04 DIAGNOSIS — E876 Hypokalemia: Secondary | ICD-10-CM

## 2014-12-04 DIAGNOSIS — M810 Age-related osteoporosis without current pathological fracture: Secondary | ICD-10-CM

## 2014-12-04 MED ORDER — DENOSUMAB 60 MG/ML ~~LOC~~ SOLN
60.0000 mg | Freq: Once | SUBCUTANEOUS | Status: AC
  Administered 2014-12-04: 60 mg via SUBCUTANEOUS

## 2014-12-04 MED ORDER — LISINOPRIL 10 MG PO TABS: 10.0000 mg | ORAL_TABLET | Freq: Every day | ORAL | Status: DC

## 2014-12-04 MED ORDER — ATORVASTATIN CALCIUM 80 MG PO TABS: 80.0000 mg | ORAL_TABLET | Freq: Every day | ORAL | Status: DC

## 2014-12-04 NOTE — Progress Notes (Signed)
Patient ID: Robin [name], female   DOB: [birth year], 74 y.o.   MRN: [id number]    Subjective:   Robin [name] is a 74 y.o. female who presents for a Medicare Annual Wellness Visit however AWV was done 05/2014.  She is due to have Prolia injection today secondary to osteoporosis.  She also is due influenza vaccine and mammogram.    Current Medications (verified) Outpatient Encounter Prescriptions as of 12/04/2014  Medication Sig  . aspirin EC 81 MG tablet Take 81 mg by mouth daily.  Marland Kitchen atorvastatin (LIPITOR) 80 MG tablet Take 1 tablet (80 mg total) by mouth daily.  . Calcium-Vitamin D (CALTRATE 600 PLUS-VIT D PO) Take 1 tablet by mouth daily.  . Cholecalciferol (VITAMIN D) 2000 UNITS tablet Take 4000IU on saturdays and sundays and 2000IU all other days.  Marland Kitchen denosumab (PROLIA) 60 MG/ML SOLN injection Bring to office for administration. Administer in upper arm, thigh, or abdomen  . ENSURE PLUS (ENSURE PLUS) LIQD Take 1 Can by mouth daily. Dx:  R63.6 (underweight)  . lisinopril (PRINIVIL,ZESTRIL) 10 MG tablet Take 1 tablet (10 mg total) by mouth daily.  . Blood Pressure Monitor KIT Use to check BP daily.  DX: I 10 (hypertension) (Patient not taking: Reported on 12/04/2014)  . [DISCONTINUED] atorvastatin (LIPITOR) 80 MG tablet Take 1 tablet (80 mg total) by mouth daily.  . [DISCONTINUED] lisinopril (PRINIVIL,ZESTRIL) 10 MG tablet Take 1 tablet (10 mg total) by mouth daily.  . [DISCONTINUED] polyethylene glycol-electrolytes (NULYTELY/GOLYTELY) 420 G solution Take 4,000 mLs by mouth once. (Patient not taking: Reported on 12/04/2014)  . [EXPIRED] denosumab (PROLIA) injection 60 mg    No facility-administered encounter medications on file as of 12/04/2014.    Allergies (verified) Review of patient's allergies indicates no known allergies.   History: Past Medical History  Diagnosis Date  . Hypertension   . Vitamin D deficiency   . Osteoporosis     history of femur fracture  . Adenomatous  colon polyp 2007    with high grade dysplasia  . Shingles   . Hyperlipidemia   . Cataract   . Atrial fibrillation [facility])    Past Surgical History  Procedure Laterality Date  . Partial colectomy  2007    due to tubulovillous adenoma  . Tubal ligation    . Eye surgery    . Laparoscopic lysis of adhesions    . 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 Colonoscopy  April 2007    Dr. Sharlett Iles: 5cm circumferential fungating tumor at the hepatic flexure, path tubulovillous adenoma.   . Colonoscopy  Jan 02, 2006    Dr. 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 Multiple polyps around anastomosis of different sizes,   . Colonoscopy  2008    Dr. Sharlett Iles. Referred to Dr. Georgette Dover for resection  . Colonoscopy N/A 07/18/2014    Procedure: COLONOSCOPY;  Surgeon: Daneil Dolin, MD;  Location: [facility];  Service: Endoscopy;  Laterality: N/A;  91   Family History  Problem Relation Age of Onset  . Diabetes Mother   . Heart disease Mother   . Cancer Mother     patient unsure of type  . Hip fracture Mother   . Heart disease Father     antigioplasty  . Cancer Sister     lung  . COPD Sister   . Heart disease Sister   . Atrial fibrillation Sister   .  Heart disease Sister     stent in left leg  . Varicose Veins Sister   . Deep vein thrombosis Sister   . Heart disease Brother   . COPD Brother   . COPD Brother   . Colon cancer Neg Hx    Social History   Occupational History  . Not on file.   Social History Main Topics  . Smoking status: Former Smoker -- 1 years    Types: Cigarettes    Quit date: 11/01/1959  . Smokeless tobacco: Never Used  . Alcohol Use: 0.6 oz/week    1 Glasses of wine per week  . Drug Use: No  . Sexual Activity: No    Do you feel safe at home?  Yes  exercise activities: Current Exercise Habits:: Structured exercise class, Type of exercise:  treadmill;strength training/weights (stationary bike), Time (Minutes): 60, Frequency (Times/Week): 4, Weekly Exercise (Minutes/Week): 240, Intensity: Moderate  Objective:    Today's Vitals   12/04/14 1039  BP: 118/72  Pulse: 72  Height: 5' 2" (1.575 m)  Weight: 106 lb (48.081 kg)  PainSc: 0-No pain   Body mass index is 19.38 kg/(m^2).    DEXA Results Date of Test T-Score for [facility] Spine L1-L4 T-Score for Total Left Hip T-Score for Total Right Hip  07/31/2013 -2.4 -3.4 -3.0  10/19/2011 -2.7 -3.1 -3.2  08/11/2010 -3.2 -3.3 -3.4  02/05/2007 -3.4 -3.5 -3.2  08/29/2005 -3.9 -3.5 -3.3           Cardiac Risk Factors include: advanced age (>1mn, >>26women);dyslipidemia;family history of premature cardiovascular disease;hypertension  Depression Screen PHQ 2/9 Scores 12/04/2014 10/23/2014 05/30/2014 03/04/2014  PHQ - 2 Score 0 0 0 3  PHQ- 9 Score - - - 5    Fall Risk Fall Risk  12/04/2014 10/23/2014 05/30/2014 03/04/2014 12/26/2013  Falls in the past year? _0   Risk for fall due to : - - - - -    Cognitive Function: MMSE - Mini Mental State Exam 12/04/2014 05/30/2014  Orientation to time 5 5  Orientation to Place 5 5  Registration 3 3  Attention/ Calculation 5 5  Recall 3 3  Language- name 2 objects 2 2  Language- repeat 1 1  Language- follow 3 step command 3 3  Language- read & follow direction 1 1  Write a sentence 1 1  Copy design 1 1  Total score 30 30    Immunizations and Health Maintenance Immunization History  Administered Date(s) Administered  . Influenza,inj,Quad PF,36+ Mos 12/05/2012, 11/28/2013  . Pneumococcal Conjugate-13 05/30/2014  . Pneumococcal Polysaccharide-23 12/05/2012  . Tdap 05/30/2014  . Zoster 10/03/2014   There are no preventive care reminders to display for this patient.  Patient Care Team: MChevis Pretty FNP as PCP - General (Nurse Practitioner) RDaneil Dolin MD as Consulting Physician  (Gastroenterology)  Indicate any recent Medical Services you may have received from other than [facility] providers in the past year (date may be approximate).    Assessment:    Hypokalemia - last BMP showed a K+ of 3.2 Osteoporosis Low weight - has improved since started Ensure daily  Screening Tests Health Maintenance  Topic Date Due  . INFLUENZA VACCINE  12/23/2014 (Originally 09/22/2014)  . MAMMOGRAM  04/22/2015 (Originally 01/25/2013)  . COLON CANCER SCREENING ANNUAL FOBT  05/30/2015  . DEXA SCAN  08/01/2015  . TETANUS/TDAP  05/29/2024  . COLONOSCOPY  07/17/2024  . ZOSTAVAX  Completed  . PNA vac Low Risk Adult  Completed  Plan:   During the course of the visit Sean was educated and counseled about the following appropriate screening and preventive services:   Vaccines to include Pneumoccal, Influenza, Td, Zostavax - patient received influenza vaccine in office today. She is UTD on all other vaccines..  Colorectal cancer screening - colonoscopy done 2016  Cardiovascular disease screening - EKG is UTD; Lipids at goal and patient on statin.  BP well controlled  When last checked potassium was low - recheck today.  Diabetes screening - UTD  Bone Denisty / Osteoporosis Screening - due next 07/2015  Discussed fall prevention  Prolia 27m adminsitered in office today - next due 05/2015  Continue calcium and vitamin D supplementation  Mammogram - scheduled while in office today  Nutrition counseling - patient to continue daily Ensure.  Weight has increased by 3#  Advanced Directives - UTD    Patient Instructions (the written plan) were given to the patient.   ECherre Robins PDe Witt Hospital & Nursing [facility]  12/04/2014

## 2014-12-04 NOTE — Patient Instructions (Addendum)
Robin [name] , Thank you for taking time to come for your Medicare Wellness Visit. I appreciate your ongoing commitment to your health goals. Please review the following plan we discussed and let me know if I can assist you in the future.   These are the goals we discussed:  Continue with exercise - great job! Continue to drink 1 Ensure daily.     This is a list of the screening recommended for you and due dates:  Health Maintenance  Topic Date Due  . Flu Shot  Fall 2017  . Mammogram  appt 12/22/2014 at 3:15pm with [facility] [place].   . Stool Blood Test  05/30/2015  . DEXA scan (bone density measurement)  08/01/2015  . Tetanus Vaccine  05/29/2024  . Colon Cancer Screening  07/17/2024  . Shingles Vaccine  Completed  . Pneumonia vaccines  Completed  *Topic was postponed. The date shown is not the original due date.    Fall Prevention in the Home  Falls can cause injuries and can affect people from all age groups. There are many simple things that you can do to make your home safe and to help prevent falls. WHAT CAN I DO ON THE OUTSIDE OF MY HOME?  Regularly repair the edges of walkways and driveways and fix any cracks.  Remove high doorway thresholds.  Trim any shrubbery on the main path into your home.  Use bright outdoor lighting.  Clear walkways of debris and clutter, including tools and rocks.  Regularly check that handrails are securely fastened and in good repair. Both sides of any steps should have handrails.  Install guardrails along the edges of any raised decks or porches.  Have leaves, snow, and ice cleared regularly.  Use sand or salt on walkways during winter months.  In the garage, clean up any spills right away, including grease or oil spills. WHAT CAN I DO IN THE BATHROOM?  Use night lights.  Install grab bars by the toilet and in the tub and shower. Do not use towel bars as grab bars.  Use non-skid mats or  decals on the floor of the tub or shower.  If you need to sit down while you are in the shower, use a plastic, non-slip stool.[place]  Keep the floor dry. Immediately clean up any water that spills on the floor.  Remove soap buildup in the tub or shower on a regular basis.  Attach bath mats securely with double-sided non-slip rug tape.  Remove throw rugs and other tripping hazards from the floor. WHAT CAN I DO IN THE BEDROOM?  Use night lights.  Make sure that a bedside light is easy to reach.  Do not use oversized bedding that drapes onto the floor.  Have a firm chair that has side arms to use for getting dressed.  Remove throw rugs and other tripping hazards from the floor. WHAT CAN I DO IN THE KITCHEN?   Clean up any spills right away.  Avoid walking on wet floors.  Place frequently used items in easy-to-reach places.  If you need to reach for something above you, use a sturdy step stool that has a grab bar.  Keep electrical cables out of the way.  Do not use floor polish or wax that makes floors slippery. If you have to use wax, make sure that it is non-skid floor wax.  Remove throw rugs and other tripping hazards from the floor. WHAT CAN I DO IN THE STAIRWAYS?  Do not leave any items on the stairs.  Make sure that there are handrails on both sides of the stairs. Fix handrails that are broken or loose. Make sure that handrails are as long as the stairways.  Check any carpeting to make sure that it is firmly attached to the stairs. Fix any carpet that is loose or worn.  Avoid having throw rugs at the top or bottom of stairways, or secure the rugs with carpet tape to prevent them from moving.  Make sure that you have a light switch at the top of the stairs and the bottom of the stairs. If you do not have them, have them installed. WHAT ARE SOME OTHER FALL PREVENTION TIPS?  Wear closed-toe shoes that fit well and support your feet. Wear shoes that have rubber soles or low  heels.  When you use a stepladder, make sure that it is completely opened and that the sides are firmly locked. Have someone hold the ladder while you are using it. Do not climb a closed stepladder.  Add color or contrast paint or tape to grab bars and handrails in your home. Place contrasting color strips on the first and last steps.  Use mobility aids as needed, such as canes, walkers, scooters, and crutches.  Turn on lights if it is dark. Replace any light bulbs that burn out.  Set up furniture so that there are clear paths. Keep the furniture in the same spot.  Fix any uneven floor surfaces.  Choose a carpet design that does not hide the edge of steps of a stairway.  Be aware of any and all pets.  Review your medicines with your healthcare provider. Some medicines can cause dizziness or changes in blood pressure, which increase your risk of falling. Talk with your health care provider about other ways that you can decrease your risk of falls. This may include working with a physical therapist or trainer to improve your strength, balance, and endurance.   This information is not intended to replace advice given to you by your health care provider. Make sure you discuss any questions you have with your health care provider.   Document Released: 01/28/2002 Document Revised: 06/24/2014 Document Reviewed: 03/14/2014 Elsevier Interactive Patient Education 2016 [place] Maintenance, Female Adopting a healthy lifestyle and getting preventive care can go a long way to promote health and wellness. Talk with your health care provider about what schedule of regular examinations is right for you. This is a good chance for you to check in with your provider about disease prevention and staying healthy. In between checkups, there are plenty of things you can do on your own. Experts have done a lot of research about which lifestyle changes and preventive measures are most likely to  keep you healthy. Ask your health care provider for more information. WEIGHT AND DIET  Eat a healthy diet  Be sure to include plenty of vegetables, fruits, low-fat dairy products, and lean protein.  Do not eat a lot of foods high in solid fats, added sugars, or salt.  Get regular exercise. This is one of the most important things you can do for your health.  Most adults should exercise for at least 150 minutes each week. The exercise should increase your heart rate and make you sweat (moderate-intensity exercise).  Most adults should also do strengthening exercises at least twice a week. This is in addition to the moderate-intensity exercise.  Maintain a healthy weight  Body mass  index (BMI) is a measurement that can be used to identify possible weight problems. It estimates body fat based on height and weight. Your health care provider can help determine your BMI and help you achieve or maintain a healthy weight.  For females 21 years of age and older:   A BMI below 18.5 is considered underweight.   A BMI of 18.5 to 24.9 is normal. - Your BMI was 19.38 today!   A BMI of 25 to 29.9 is considered overweight.  A BMI of 30 and above is considered obese.  Watch levels of cholesterol and blood lipids  You should start having your blood tested for lipids and cholesterol at 73 years of age, then have this test every 5 years.  You may need to have your cholesterol levels checked more often if:  Your lipid or cholesterol levels are high.  You are older than 73 years of age.  You are at high risk for heart disease.  CANCER SCREENING   Lung Cancer  Lung cancer screening is recommended for adults 12-73 years old who are at high risk for lung cancer because of a history of smoking.  A yearly low-dose CT scan of the lungs is recommended for people who:  Currently smoke.  Have quit within the past 15 years.  Have at least a 30-pack-year history of smoking. A pack year is  smoking an average of one pack of cigarettes a day for 1 year.  Yearly screening should continue until it has been 15 years since you quit.  Yearly screening should stop if you develop a health problem that would prevent you from having lung cancer treatment.  Breast Cancer  Practice breast self-awareness. This means understanding how your breasts normally appear and feel.  It also means doing regular breast self-exams. Let your health care provider know about any changes, no matter how small.  If you are in your 20s or 30s, you should have a clinical breast exam (CBE) by a health care provider every 1-3 years as part of a regular health exam.  If you are 29 or older, have a CBE every year. Also consider having a breast X-ray (mammogram) every year.  If you have a family history of breast cancer, talk to your health care provider about genetic screening.  If you are at high risk for breast cancer, talk to your health care provider about having an MRI and a mammogram every year.  Breast cancer gene (BRCA) assessment is recommended for women who have family members with BRCA-related cancers. BRCA-related cancers include:  Breast.  Ovarian.  Tubal.  Peritoneal cancers.  Results of the assessment will determine the need for genetic counseling and BRCA1 and BRCA2 testing. Cervical Cancer Your health care provider may recommend that you be screened regularly for cancer of the pelvic organs (ovaries, uterus, and vagina). This screening involves a pelvic examination, including checking for microscopic changes to the surface of your cervix (Pap test). You may be encouraged to have this screening done every 3 years, beginning at age 41.  For women ages 69-65, health care providers may recommend pelvic exams and Pap testing every 3 years, or they may recommend the Pap and pelvic exam, combined with testing for human papilloma virus (HPV), every 5 years. Some types of HPV increase your risk of  cervical cancer. Testing for HPV may also be done on women of any age with unclear Pap test results.  Other health care providers may not recommend any screening  for nonpregnant women who are considered low risk for pelvic cancer and who do not have symptoms. Ask your health care provider if a screening pelvic exam is right for you.  If you have had past treatment for cervical cancer or a condition that could lead to cancer, you need Pap tests and screening for cancer for at least 20 years after your treatment. If Pap tests have been discontinued, your risk factors (such as having a new sexual partner) need to be reassessed to determine if screening should resume. Some women have medical problems that increase the chance of getting cervical cancer. In these cases, your health care provider may recommend more frequent screening and Pap tests. Colorectal Cancer  This type of cancer can be detected and often prevented.  Routine colorectal cancer screening usually begins at 73 years of age and continues through 73 years of age.  Your health care provider may recommend screening at an earlier age if you have risk factors for colon cancer.  Your health care provider may also recommend using home test kits to check for hidden blood in the stool.  A small camera at the end of a tube can be used to examine your colon directly (sigmoidoscopy or colonoscopy). This is done to check for the earliest forms of colorectal cancer.  Routine screening usually begins at age 68.  Direct examination of the colon should be repeated every 5-10 years through 73 years of age. However, you may need to be screened more often if early forms of precancerous polyps or small growths are found. Skin Cancer  Check your skin from head to toe regularly.  Tell your health care provider about any new moles or changes in moles, especially if there is a change in a mole's shape or color.  Also tell your health care provider if  you have a mole that is larger than the size of a pencil eraser.  Always use sunscreen. Apply sunscreen liberally and repeatedly throughout the day.  Protect yourself by wearing long sleeves, pants, a wide-brimmed hat, and sunglasses whenever you are outside. HEART DISEASE, DIABETES, AND HIGH BLOOD PRESSURE   High blood pressure causes heart disease and increases the risk of stroke. High blood pressure is more likely to develop in:  People who have blood pressure in the high end of the normal range (130-139/85-89 mm Hg).  People who are overweight or obese.  People who are African American.  If you are 35-66 years of age, have your blood pressure checked every 3-5 years. If you are 36 years of age or older, have your blood pressure checked every year. You should have your blood pressure measured twice--once when you are at a hospital or clinic, and once when you are not at a hospital or clinic. Record the average of the two measurements. To check your blood pressure when you are not at a hospital or clinic, you can use:  An automated blood pressure machine at a pharmacy.  A home blood pressure monitor.  If you are between 47 years and 39 years old, ask your health care provider if you should take aspirin to prevent strokes.  Have regular diabetes screenings. This involves taking a blood sample to check your fasting blood sugar level.  If you are at a normal weight and have a low risk for diabetes, have this test once every three years after 73 years of age.  If you are overweight and have a high risk for diabetes, consider being tested  at a younger age or more often. PREVENTING INFECTION  Hepatitis B  If you have a higher risk for hepatitis B, you should be screened for this virus. You are considered at high risk for hepatitis B if:  You were born in a country where hepatitis B is common. Ask your health care provider which countries are considered high risk.  Your parents were  born in a high-risk country, and you have not been immunized against hepatitis B (hepatitis B vaccine).  You have HIV or AIDS.  You use needles to inject street drugs.  You live with someone who has hepatitis B.  You have had sex with someone who has hepatitis B.  You get hemodialysis treatment.  You take certain medicines for conditions, including cancer, organ transplantation, and autoimmune conditions. Hepatitis C  Blood testing is recommended for:  Everyone born from [birth year] through 1964.  Anyone with known risk factors for hepatitis C. Sexually transmitted infections (STIs)  You should be screened for sexually transmitted infections (STIs) including gonorrhea and chlamydia if:  You are sexually active and are younger than 73 years of age.  You are older than 73 years of age and your health care provider tells you that you are at risk for this type of infection.  Your sexual activity has changed since you were last screened and you are at an increased risk for chlamydia or gonorrhea. Ask your health care provider if you are at risk.  If you do not have HIV, but are at risk, it may be recommended that you take a prescription medicine daily to prevent HIV infection. This is called pre-exposure prophylaxis (PrEP). You are considered at risk if:  You are sexually active and do not regularly use condoms or know the HIV status of your partner(s).  You take drugs by injection.  You are sexually active with a partner who has HIV. Talk with your health care provider about whether you are at high risk of being infected with HIV. If you choose to begin PrEP, you should first be tested for HIV. You should then be tested every 3 months for as long as you are taking PrEP.  PREGNANCY   If you are premenopausal and you may become pregnant, ask your health care provider about preconception counseling.  If you may become pregnant, take 400 to 800 micrograms (mcg) of folic acid every  day.  If you want to prevent pregnancy, talk to your health care provider about birth control (contraception). OSTEOPOROSIS AND MENOPAUSE   Osteoporosis is a disease in which the bones lose minerals and strength with aging. This can result in serious bone fractures. Your risk for osteoporosis can be identified using a bone density scan.  If you are 53 years of age or older, or if you are at risk for osteoporosis and fractures, ask your health care provider if you should be screened.  Ask your health care provider whether you should take a calcium or vitamin D supplement to lower your risk for osteoporosis.  Menopause may have certain physical symptoms and risks.  Hormone replacement therapy may reduce some of these symptoms and risks. Talk to your health care provider about whether hormone replacement therapy is right for you.  HOME CARE INSTRUCTIONS   Schedule regular health, dental, and eye exams.  Stay current with your immunizations.   Do not use any tobacco products including cigarettes, chewing tobacco, or electronic cigarettes.  If you are pregnant, do not drink alcohol.  If you  are breastfeeding, limit how much and how often you drink alcohol.  Limit alcohol intake to no more than 1 drink per day for nonpregnant women. One drink equals 12 ounces of beer, 5 ounces of wine, or 1 ounces of hard liquor.  Do not use street drugs.  Do not share needles.  Ask your health care provider for help if you need support or information about quitting drugs.  Tell your health care provider if you often feel depressed.  Tell your health care provider if you have ever been abused or do not feel safe at home.   This information is not intended to replace advice given to you by your health care provider. Make sure you discuss any questions you have with your health care provider.   Document Released: 08/23/2010 Document Revised: 02/28/2014 Document Reviewed: 01/09/2013 Elsevier  Interactive Patient Education [employer].

## 2014-12-05 ENCOUNTER — Ambulatory Visit: Payer: Self-pay | Admitting: Pharmacist

## 2014-12-05 LAB — BMP8+EGFR
BUN / CREAT RATIO: 14 (ref 11–26)
BUN: 11 mg/dL (ref 8–27)
CHLORIDE: 101 mmol/L (ref 97–108)
CO2: 29 mmol/L (ref 18–29)
Calcium: 9.7 mg/dL (ref 8.7–10.3)
Creatinine, Ser: 0.77 mg/dL (ref 0.57–1.00)
GFR, EST AFRICAN AMERICAN: 89 mL/min/{1.73_m2} (ref >59)
GFR, EST NON AFRICAN AMERICAN: 77 mL/min/{1.73_m2} (ref >59)
Glucose: 85 mg/dL (ref 65–99)
POTASSIUM: 4.1 mmol/L (ref 3.5–5.2)
Sodium: 144 mmol/L (ref 134–144)

## 2014-12-22 DIAGNOSIS — Z1231 Encounter for screening mammogram for malignant neoplasm of breast: Secondary | ICD-10-CM | POA: Diagnosis not present

## 2014-12-24 ENCOUNTER — Other Ambulatory Visit: Payer: Self-pay | Admitting: Nurse Practitioner

## 2014-12-24 DIAGNOSIS — R922 Inconclusive mammogram: Secondary | ICD-10-CM

## 2015-01-13 ENCOUNTER — Other Ambulatory Visit: Payer: Self-pay | Admitting: Nurse Practitioner

## 2015-01-13 ENCOUNTER — Ambulatory Visit (HOSPITAL_COMMUNITY)
Admission: RE | Admit: 2015-01-13 | Discharge: 2015-01-13 | Disposition: A | Discharge status: Home / Self Care | Payer: Medicare Other | Source: Ambulatory Visit | Attending: Nurse Practitioner | Admitting: Nurse Practitioner

## 2015-01-13 DIAGNOSIS — N6489 Other specified disorders of breast: Secondary | ICD-10-CM | POA: Insufficient documentation

## 2015-01-13 DIAGNOSIS — R922 Inconclusive mammogram: Secondary | ICD-10-CM

## 2015-01-13 DIAGNOSIS — R928 Other abnormal and inconclusive findings on diagnostic imaging of breast: Secondary | ICD-10-CM | POA: Diagnosis not present

## 2015-04-08 ENCOUNTER — Telehealth: Payer: Self-pay | Admitting: Pharmacist

## 2015-04-09 NOTE — Telephone Encounter (Signed)
Dexa next due 07/2015.  Next Prolia due around 06/04/2015 (last was 12/04/2014) I will get in touch with her closer to due date and set up appt. Patient notified.

## 2015-05-18 ENCOUNTER — Other Ambulatory Visit: Payer: Self-pay | Admitting: Pharmacist

## 2015-05-18 MED ORDER — DENOSUMAB 60 MG/ML ~~LOC~~ SOLN: SUBCUTANEOUS | Status: DC

## 2015-06-08 ENCOUNTER — Encounter: Payer: Self-pay | Admitting: Pharmacist

## 2015-06-08 ENCOUNTER — Ambulatory Visit (INDEPENDENT_AMBULATORY_CARE_PROVIDER_SITE_OTHER): Payer: Medicare Other | Admitting: Pharmacist

## 2015-06-08 VITALS — Ht 61.75 in | Wt 111.0 lb

## 2015-06-08 DIAGNOSIS — Z0000 Encounter for general adult medical examination without abnormal findings: Secondary | ICD-10-CM

## 2015-06-08 DIAGNOSIS — I10 Essential (primary) hypertension: Secondary | ICD-10-CM

## 2015-06-08 MED ORDER — LISINOPRIL 10 MG PO TABS: 10.0000 mg | ORAL_TABLET | Freq: Every day | ORAL | Status: DC

## 2015-06-08 MED ORDER — HYDROCORTISONE 0.5 % EX CREA: 1.0000 "application " | TOPICAL_CREAM | Freq: Three times a day (TID) | CUTANEOUS | Status: AC

## 2015-06-08 NOTE — Patient Instructions (Addendum)
  Ms. Tessmann , Thank you for taking time to come for your Medicare Wellness Visit. I appreciate your ongoing commitment to your health goals. Please review the following plan we discussed and let me know if I can assist you in the future.   These are the goals we discussed:  Continue to eat a variety of fruits and vegetables;  Choose lean meat / proteins and whole grains.  Continue to exercise - goal is 150 minutes each week.  Continue to drink Ensure daily.  Try antihistamine - Claritin / loratidine or Allegra (wont cause drowsiness) once daily for itching Also apply hydrocortisone 0.5% up to three times a day as needed for itching.  If no improvement in 5 to 7 days then call office for appointment    This is a list of the screening recommended for you and due dates:  Health Maintenance  Topic Date Due  . Stool Blood Test  05/30/2015  . DEXA scan (bone density measurement)  08/01/2015  . Flu Shot  09/22/2015  . Mammogram  12/23/2015  . Colon Cancer Screening  07/18/2019  . Tetanus Vaccine  05/29/2024  . Shingles Vaccine  Completed  . Pneumonia vaccines  Completed

## 2015-06-08 NOTE — Progress Notes (Signed)
Patient ID: Robin [name], female   DOB: [birth year], 75 y.o.   MRN: [id number]    Subjective:   Robin [name] is a 75 y.o. female who presents for a subsequent Medicare Annual Wellness Visit and for Prolia injectoin but patient forgot to pick up Prolia at [place] and they returned to stock.  Called [facility] and they will order for tomorrow's delivery.  Review of Systems  Review of Systems  Constitutional: Negative.   HENT: Negative.   Eyes: Negative.   Respiratory: Negative.   Cardiovascular: Negative.   Gastrointestinal: Negative.   Genitourinary: Negative.   Musculoskeletal: Negative.   Skin: Positive for itching and rash (mild welps on right fore arm and chest ).  Neurological: Negative.   Endo/Heme/Allergies: Negative.   Psychiatric/Behavioral: Negative.      Current Medications (verified) Outpatient Encounter Prescriptions as of 06/08/2015  Medication Sig  . aspirin EC 81 MG tablet Take 81 mg by mouth daily.  Marland Kitchen atorvastatin (LIPITOR) 80 MG tablet Take 1 tablet (80 mg total) by mouth daily.  . Calcium-Vitamin D (CALTRATE 600 PLUS-VIT D PO) Take 1 tablet by mouth daily.  . Cholecalciferol (VITAMIN D) 2000 UNITS tablet Take 4000IU on saturdays and sundays and 2000IU all other days.  Marland Kitchen denosumab (PROLIA) 60 MG/ML SOLN injection Bring to office for administration. Administer in upper arm, thigh, or abdomen  . ENSURE PLUS (ENSURE PLUS) LIQD Take 1 Can by mouth daily. Dx:  R63.6 (underweight)  . lisinopril (PRINIVIL,ZESTRIL) 10 MG tablet Take 1 tablet (10 mg total) by mouth daily.  . [DISCONTINUED] Blood Pressure Monitor KIT Use to check BP daily.  DX: I 10 (hypertension) (Patient not taking: Reported on 12/04/2014)   No facility-administered encounter medications on file as of 06/08/2015.    Allergies (verified) Review of patient's allergies indicates no known allergies.   History: Past Medical History  Diagnosis Date  . Hypertension   . Vitamin D deficiency   .  Osteoporosis     history of femur fracture  . 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 Shingles   . Hyperlipidemia   . Cataract   . Atrial fibrillation [facility])    Past Surgical History  Procedure Laterality Date  . Partial colectomy  2007    due to tubulovillous adenoma  . Tubal ligation    . Eye surgery    . Laparoscopic lysis of adhesions    . 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 Colonoscopy  April 2007    Dr. Sharlett Iles: 5cm circumferential fungating tumor at the hepatic flexure, path tubulovillous adenoma.   . Colonoscopy  Jan 02, 2006    Dr. 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 Multiple polyps around anastomosis of different sizes,   . Colonoscopy  2008    Dr. Sharlett Iles. Referred to Dr. Georgette Dover for resection  . Colonoscopy N/A 07/18/2014    Procedure: COLONOSCOPY;  Surgeon: Daneil Dolin, MD;  Location: [facility];  Service: Endoscopy;  Laterality: N/A;  77   Family History  Problem Relation Age of Onset  . Diabetes Mother   . Heart disease Mother   . Cancer Mother     patient unsure of type  . Hip fracture Mother   . Heart disease Father     antigioplasty  . Cancer Sister     lung  . COPD Sister   . Heart disease Sister   .  Atrial fibrillation Sister   . Heart disease Sister     stent in left leg  . Varicose Veins Sister   . Deep vein thrombosis Sister   . Heart disease Brother   . COPD Brother   . COPD Brother   . Colon cancer Neg Hx    Social History   Occupational History  . Not on file.   Social History Main Topics  . Smoking status: Former Smoker -- 1 years    Types: Cigarettes    Quit date: 11/01/1959  . Smokeless tobacco: Never Used  . Alcohol Use: No  . Drug Use: No  . Sexual Activity: No    Do you feel safe at home?  Yes Are there smokers in your home (other than you)? No  Dietary issues and exercise  activities: Current Exercise Habits: Home exercise routine, Type of exercise: walking, Time (Minutes): 40, Frequency (Times/Week): 3, Weekly Exercise (Minutes/Week): 120, Intensity: Moderate  Current Dietary habits:  BREAKFAST:  Cereal and OJ;  LUNCH: tossed salad and Ensure;  DINNER: meat and vegetables; pasta; SNACKS: cheese crackers   Objective:    Today's Vitals   06/08/15 1416  Height: 5' 1.75" (1.568 m)  Weight: 111 lb (50.349 kg)   Body mass index is 20.48 kg/(m^2).  Activities of Daily Living In your present state of health, do you have any difficulty performing the following activities: 06/08/2015 12/04/2014  Hearing? N N  Vision? N N  Difficulty concentrating or making decisions? N N  Walking or climbing stairs? N N  Dressing or bathing? N N  Doing errands, shopping? N N  Preparing Food and eating ? N N  Using the Toilet? N N  In the past six months, have you accidently leaked urine? N N  Do you have problems with loss of bowel control? N N  Managing your Medications? N N  Managing your Finances? N N  Housekeeping or managing your Housekeeping? N N     Cardiac Risk Factors include: advanced age (>5mn, >>10women);dyslipidemia;family history of premature cardiovascular disease;hypertension  Depression Screen PHQ 2/9 Scores 06/08/2015 12/04/2014 10/23/2014 05/30/2014  PHQ - 2 Score 0 0 0 0  PHQ- 9 Score - - - -     Fall Risk Fall Risk  06/08/2015 12/04/2014 10/23/2014 05/30/2014 03/04/2014  Falls in the past year? _0   Risk for fall due to : - - - - -    Cognitive Function: MMSE - Mini Mental State Exam 06/08/2015 12/04/2014 05/30/2014  Orientation to time _1 Orientation to Place _2 Registration _3 Attention/ Calculation _4 Recall _5 Language- name 2 objects _6 Language- repeat _7 Language- follow 3 step command _8 Language- read & follow direction _9 Write a sentence _10 Copy design _11 Total score _12 Immunizations and Health Maintenance Immunization History  Administered Date(s) Administered  . Influenza,inj,Quad PF,36+ Mos 12/05/2012, 11/28/2013, 12/04/2014  . Pneumococcal Conjugate-13 05/30/2014  . Pneumococcal Polysaccharide-23 12/05/2012  . Tdap 05/30/2014  . Zoster 10/03/2014   Health Maintenance Due  Topic Date Due  . COLON CANCER SCREENING ANNUAL FOBT  05/30/2015    Patient Care Team: MChevis Pretty FNP as PCP - General (Nurse Practitioner) RDaneil Dolin MD as Consulting Physician (Gastroenterology)  Indicate any recent Medical Services  you may have received from other than [facility] providers in the past year (date may be approximate).    Assessment:    Annual Wellness Visit  Allergic rash   Screening Tests Health Maintenance  Topic Date Due  . COLON CANCER SCREENING ANNUAL FOBT  05/30/2015  . DEXA SCAN  08/01/2015  . INFLUENZA VACCINE  09/22/2015  . MAMMOGRAM  12/23/2015  . TETANUS/TDAP  05/29/2024  . COLONOSCOPY  07/17/2024  . ZOSTAVAX  Completed  . PNA vac Low Risk Adult  Completed        Plan:   During the course of the visit Rhandi was educated and counseled about the following appropriate screening and preventive services:   Vaccines to include Pneumoccal, Influenza, Td, Zostavax - all vaccines are UTD  Colorectal cancer screening - colonoscopy is UTD next due in 2021 (q 5 years per Dr Gala Romney)  Cardiovascular disease screening - EKG is UTD; BP and lipids are at goal  Diabetes screening - last FBG was WNL and is UTD  Bone Denisty / Osteoporosis Screening - Due 07/2015 - appt made today  Mammogram - UTD, next due 12/2015  PAP- no longer required  Glaucoma screening / Eye Exam - UTD  Nutrition counseling - continue to eat variety of fruits and vegetables, eat lean proteins / meats and choose whole grains.  Continue Ensure daily  Advanced Directives - UTD and copy on file  Physical Activity - continue to exercise regularly - goal  is 150  Minutes each week  Refilled lisinopril  Recommended claritin or allegra qd for 5 to 7 days and hydrocortisone 0.5% applied to affected area up to tid for 5 to 7 days - in no improvement or if rash worsens then call office to see PCP.  Patient Instructions (the written plan) were given to the patient.   Cherre Robins, [facility]   06/08/2015

## 2015-06-11 ENCOUNTER — Ambulatory Visit (INDEPENDENT_AMBULATORY_CARE_PROVIDER_SITE_OTHER): Payer: Medicare Other | Admitting: Pharmacist

## 2015-06-11 DIAGNOSIS — M810 Age-related osteoporosis without current pathological fracture: Secondary | ICD-10-CM | POA: Diagnosis not present

## 2015-06-11 MED ORDER — DENOSUMAB 60 MG/ML ~~LOC~~ SOLN
60.0000 mg | Freq: Once | SUBCUTANEOUS | Status: AC
  Administered 2015-06-11: 60 mg via SUBCUTANEOUS

## 2015-06-11 NOTE — Progress Notes (Signed)
Patient ID: Robin [name], female   DOB: [birth year], 75 y.o.   MRN: [id number]   Subjective:   Robin [name] is a 75 y.o. female who presents recheck osteoporosis. She is due to have Prolia injection today secondary to osteoporosis.    Current Medications (verified) Outpatient Encounter Prescriptions as of 06/11/2015  Medication Sig  . aspirin EC 81 MG tablet Take 81 mg by mouth daily.  Marland Kitchen atorvastatin (LIPITOR) 80 MG tablet Take 1 tablet (80 mg total) by mouth daily.  . Calcium-Vitamin D (CALTRATE 600 PLUS-VIT D PO) Take 1 tablet by mouth daily.  . Cholecalciferol (VITAMIN D) 2000 UNITS tablet Take 4000IU on saturdays and sundays and 2000IU all other days.  Marland Kitchen denosumab (PROLIA) 60 MG/ML SOLN injection Bring to office for administration. Administer in upper arm, thigh, or abdomen  . ENSURE PLUS (ENSURE PLUS) LIQD Take 1 Can by mouth daily. Dx:  R63.6 (underweight)  . hydrocortisone cream 0.5 % Apply 1 application topically 3 (three) times daily.  Marland Kitchen lisinopril (PRINIVIL,ZESTRIL) 10 MG tablet Take 1 tablet (10 mg total) by mouth daily.   No facility-administered encounter medications on file as of 06/11/2015.    Allergies (verified) Review of patient's allergies indicates no known allergies.   History: Past Medical History  Diagnosis Date  . Hypertension   . Vitamin D deficiency   . Osteoporosis     history of femur fracture  . 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 Shingles   . Hyperlipidemia   . Cataract   . Atrial fibrillation [facility])    Past Surgical History  Procedure Laterality Date  . Partial colectomy  2007    due to tubulovillous adenoma  . Tubal ligation    . Eye surgery    . Laparoscopic lysis of adhesions    . 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 Colonoscopy  April 2007    Dr. Sharlett Iles: 5cm circumferential fungating tumor at the hepatic flexure, path tubulovillous  adenoma.   . Colonoscopy  Jan 02, 2006    Dr. 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 Multiple polyps around anastomosis of different sizes,   . Colonoscopy  2008    Dr. Sharlett Iles. Referred to Dr. Georgette Dover for resection  . Colonoscopy N/A 07/18/2014    Procedure: COLONOSCOPY;  Surgeon: Daneil Dolin, MD;  [facility];  Service: Endoscopy;  Laterality: N/A;  56   Family History  Problem Relation Age of Onset  . Diabetes Mother   . Heart disease Mother   . Cancer Mother     patient unsure of type  . Hip fracture Mother   . Heart disease Father     antigioplasty  . Cancer Sister     lung  . COPD Sister   . Heart disease Sister   . Atrial fibrillation Sister   . Heart disease Sister     stent in left leg  . Varicose Veins Sister   . Deep vein thrombosis Sister   . Heart disease Brother   . COPD Brother   . COPD Brother   . Colon cancer Neg Hx    Social History   Occupational History  . Not on file.   Social History Main Topics  . Smoking status: Former Smoker -- 1 years    Types: Cigarettes    Quit date: 11/01/1959  . Smokeless tobacco: Never Used  .  Alcohol Use: No  . Drug Use: No  . Sexual Activity: No      Objective:    There were no vitals filed for this visit. There is no weight on file to calculate BMI.    DEXA Results Date of Test T-Score for AP Spine L1-L4 T-Score for Total Left Hip T-Score for Total Right Hip  07/31/2013 -2.4 -3.4 -3.0  10/19/2011 -2.7 -3.1 -3.2  08/11/2010 -3.2 -3.3 -3.4  02/05/2007 -3.4 -3.5 -3.2  08/29/2005 -3.9 -3.5 -3.3              Assessment:    Osteoporosis  Screening Tests Health Maintenance  Topic Date Due  . COLON CANCER SCREENING ANNUAL FOBT  05/30/2015  . DEXA SCAN  08/01/2015  . INFLUENZA VACCINE  09/22/2015  . MAMMOGRAM  12/23/2015  . COLONOSCOPY  07/18/2019  . TETANUS/TDAP  05/29/2024  . ZOSTAVAX  Completed  . PNA vac Low  Risk Adult  Completed      Plan:    Recommendations: 1. Prolia 60mg  injection SQ q6 months - injection given today - next 11/2015 2. continue calcium 1200mg  daily through supplementation or diet. Continue vitamin D to 4000IU on Satruday and Sunday and 2000IU all other days. 3. Continue weight bearing exercise - 30 minutes at least 4 days per week.   Recheck DEXA: June 2017 - appt already made  Time spent counseling patient: 10 minutes  Cherre Robins, PharmD, CPP

## 2015-06-17 DIAGNOSIS — B86 Scabies: Secondary | ICD-10-CM | POA: Diagnosis not present

## 2015-07-28 ENCOUNTER — Telehealth: Payer: Self-pay | Admitting: Pharmacist

## 2015-07-28 NOTE — Telephone Encounter (Signed)
Patient has appt for DEXA next week but out DEXA is being update.  Tried to call - LMOVM that DEXA has been rescheduled for 09/02/15/

## 2015-08-03 ENCOUNTER — Ambulatory Visit: Payer: Self-pay | Admitting: Pharmacist

## 2015-09-08 ENCOUNTER — Other Ambulatory Visit: Payer: Self-pay | Admitting: Pharmacist

## 2015-09-08 ENCOUNTER — Ambulatory Visit: Payer: Medicare Other | Admitting: Pharmacist

## 2015-09-08 DIAGNOSIS — E785 Hyperlipidemia, unspecified: Secondary | ICD-10-CM

## 2015-09-08 DIAGNOSIS — I10 Essential (primary) hypertension: Secondary | ICD-10-CM

## 2015-09-08 MED ORDER — ATORVASTATIN CALCIUM 80 MG PO TABS: 80.0000 mg | ORAL_TABLET | Freq: Every day | ORAL | Status: DC

## 2015-09-08 MED ORDER — LISINOPRIL 10 MG PO TABS: 10.0000 mg | ORAL_TABLET | Freq: Every day | ORAL | Status: DC

## 2015-10-13 ENCOUNTER — Other Ambulatory Visit: Payer: Self-pay | Admitting: Pharmacist

## 2015-10-13 MED ORDER — DENOSUMAB 60 MG/ML ~~LOC~~ SOLN: SUBCUTANEOUS | 1 refills | Status: DC

## 2015-10-14 ENCOUNTER — Telehealth: Payer: Self-pay | Admitting: *Deleted

## 2015-10-14 NOTE — Telephone Encounter (Signed)
Kristen at [facility] calling to check on PA for Prolia

## 2015-10-15 ENCOUNTER — Ambulatory Visit: Payer: Self-pay | Admitting: Nurse Practitioner

## 2015-10-15 NOTE — Telephone Encounter (Signed)
Need to know number of PA - is it her Medicare Part D plan or Medicaid that we need to call for PA? Tried to call twice and [facility]'s number was busy.

## 2015-10-15 NOTE — Telephone Encounter (Signed)
Received paperwork to PA with number to call - PA dept will work on [place]

## 2015-10-19 ENCOUNTER — Encounter: Payer: Self-pay | Admitting: Nurse Practitioner

## 2015-11-04 ENCOUNTER — Encounter: Payer: Self-pay | Admitting: Nurse Practitioner

## 2015-11-04 ENCOUNTER — Ambulatory Visit (INDEPENDENT_AMBULATORY_CARE_PROVIDER_SITE_OTHER): Payer: Medicare Other | Admitting: Nurse Practitioner

## 2015-11-04 VITALS — BP 142/80 | HR 71 | Temp 97.9°F | Ht 61.0 in | Wt 110.0 lb

## 2015-11-04 DIAGNOSIS — Z1211 Encounter for screening for malignant neoplasm of colon: Secondary | ICD-10-CM

## 2015-11-04 DIAGNOSIS — I10 Essential (primary) hypertension: Secondary | ICD-10-CM

## 2015-11-04 DIAGNOSIS — Z1212 Encounter for screening for malignant neoplasm of rectum: Secondary | ICD-10-CM

## 2015-11-04 DIAGNOSIS — M810 Age-related osteoporosis without current pathological fracture: Secondary | ICD-10-CM

## 2015-11-04 DIAGNOSIS — E559 Vitamin D deficiency, unspecified: Secondary | ICD-10-CM | POA: Diagnosis not present

## 2015-11-04 DIAGNOSIS — E785 Hyperlipidemia, unspecified: Secondary | ICD-10-CM | POA: Diagnosis not present

## 2015-11-04 MED ORDER — LISINOPRIL 10 MG PO TABS: 10.0000 mg | ORAL_TABLET | Freq: Every day | ORAL | 1 refills | Status: DC

## 2015-11-04 MED ORDER — ATORVASTATIN CALCIUM 80 MG PO TABS: 80.0000 mg | ORAL_TABLET | Freq: Every day | ORAL | 1 refills | Status: DC

## 2015-11-04 NOTE — Progress Notes (Signed)
Subjective:    Patient ID: Robin [name], female    DOB: [birth year], 75 y.o.   MRN: [id number]   Patient here today for follow up of chronic medical problems. No changes since last visit. No complaints today.  Outpatient Encounter Prescriptions as of 11/04/2015  Medication Sig  . aspirin EC 81 MG tablet Take 81 mg by mouth daily.  Marland Kitchen atorvastatin (LIPITOR) 80 MG tablet Take 1 tablet (80 mg total) by mouth daily.  . Calcium-Vitamin D (CALTRATE 600 PLUS-VIT D PO) Take 1 tablet by mouth daily.  . Cholecalciferol (VITAMIN D) 2000 UNITS tablet Take 4000IU on saturdays and sundays and 2000IU all other days.  Marland Kitchen denosumab (PROLIA) 60 MG/ML SOLN injection Bring to office for administration. Administer in upper arm, thigh, or abdomen  . ENSURE PLUS (ENSURE PLUS) LIQD Take 1 Can by mouth daily. Dx:  R63.6 (underweight)  . lisinopril (PRINIVIL,ZESTRIL) 10 MG tablet Take 1 tablet (10 mg total) by mouth daily.     Hyperlipidemia  This is a chronic problem. The current episode started more than 1 year ago. The problem is controlled. Recent lipid tests were reviewed and are normal. She has no history of diabetes, hypothyroidism or obesity. Pertinent negatives include no chest pain, myalgias or shortness of breath. The current treatment provides moderate improvement of lipids. Compliance problems include adherence to diet and adherence to exercise.  Risk factors for coronary artery disease include dyslipidemia.  Hypertension  This is a new problem. The current episode started more than 1 month ago. The problem is controlled. Pertinent negatives include no chest pain, malaise/fatigue, palpitations, peripheral edema or shortness of breath. Risk factors for coronary artery disease include dyslipidemia and post-menopausal state. Past treatments include ACE inhibitors. The current treatment provides significant improvement. There is no history of CAD/MI or CVA.  Vitamin D DEFICIENCY Vitamin D 2000 Iu OTC daily-  tolerating well osteoporosis Calcium and vitamin d daily- no c/o back pain.     Review of Systems  Constitutional: Negative for appetite change, malaise/fatigue and unexpected weight change.  Respiratory: Negative for shortness of breath.   Cardiovascular: Negative for chest pain, palpitations and leg swelling.  Genitourinary: Negative.   Musculoskeletal: Negative for myalgias.  Neurological: Negative.   All other systems reviewed and are negative.      Objective:   Physical Exam  Constitutional: She is oriented to person, place, and time. She appears well-developed and well-nourished.  HENT:  Nose: Nose normal.  Mouth/Throat: Oropharynx is clear and moist.  Eyes: EOM are normal.  Neck: Trachea normal, normal range of motion and full passive range of motion without pain. Neck supple. No JVD present. Carotid bruit is not present. No thyromegaly present.  Cardiovascular: Normal rate, regular rhythm, normal heart sounds and intact distal pulses.  Exam reveals no gallop and no friction rub.   No murmur heard. Pulmonary/Chest: Effort normal and breath sounds normal.  Abdominal: Soft. Bowel sounds are normal. She exhibits no distension and no mass. There is no tenderness.  Musculoskeletal: Normal range of motion.  Lymphadenopathy:    She has no cervical adenopathy.  Neurological: She is alert and oriented to person, place, and time. She has normal reflexes.  Skin: Skin is warm and dry.  Psychiatric: She has a normal mood and affect. Her behavior is normal. Judgment and thought content normal.   BP (!) 142/80 (BP Location: Left Arm, Cuff Size: Normal)   Pulse 71   Temp 97.9 F (36.6 C) (Oral)   Ht 5'  1" (1.549 m)   Wt 110 lb (49.9 kg)   BMI 20.78 kg/m        Assessment & Plan:  1. Essential hypertension Do noit add salt diet - lisinopril (PRINIVIL,ZESTRIL) 10 MG tablet; Take 1 tablet (10 mg total) by mouth daily.  Dispense: 90 tablet; Refill: 1 - CMP14+EGFR  2.  Osteoporosis Weight bearing exercises encouraged - DG [facility] DEXA  3. Hyperlipidemia Low fat diet - atorvastatin (LIPITOR) 80 MG tablet; Take 1 tablet (80 mg total) by mouth daily.  Dispense: 90 tablet; Refill: 1 - Lipid panel  4. Vitamin D insufficiency  5. Screening for colorectal cancer - Fecal occult blood, imunochemical; Future    Labs pending Health maintenance reviewed Diet and exercise encouraged Continue all meds Follow up  In 6 months   [place], FNP

## 2015-11-04 NOTE — Patient Instructions (Signed)
Bone Health Bones protect organs, store calcium, and anchor muscles. Good health habits, such as eating nutritious foods and exercising regularly, are important for maintaining healthy bones. They can also help to prevent a condition that causes bones to lose density and become weak and brittle (osteoporosis). WHY IS BONE MASS IMPORTANT? Bone mass refers to the amount of bone tissue that you have. The higher your bone mass, the stronger your bones. An important step toward having healthy bones throughout life is to have strong and dense bones during childhood. A young adult who has a high bone mass is more likely to have a high bone mass later in life. Bone mass at its greatest it is called peak bone mass. A large decline in bone mass occurs in older adults. In women, it occurs about the time of menopause. During this time, it is important to practice good health habits, because if more bone is lost than what is replaced, the bones will become less healthy and more likely to break (fracture). If you find that you have a low bone mass, you may be able to prevent osteoporosis or further bone loss by changing your diet and lifestyle. HOW CAN I FIND OUT IF MY BONE MASS IS LOW? Bone mass can be measured with an X-ray test that is called a bone mineral density (BMD) test. This test is recommended for all women who are age 65 or older. It may also be recommended for men who are age 70 or older, or for people who are more likely to develop osteoporosis due to:  Having bones that break easily.  Having a long-term disease that weakens bones, such as kidney disease or rheumatoid arthritis.  Having menopause earlier than normal.  Taking medicine that weakens bones, such as steroids, thyroid hormones, or hormone treatment for breast cancer or prostate cancer.  Smoking.  Drinking three or more alcoholic drinks each day. WHAT ARE THE NUTRITIONAL RECOMMENDATIONS FOR HEALTHY BONES? To have healthy bones, you need  to get enough of the right minerals and vitamins. Most nutrition experts recommend getting these nutrients from the foods that you eat. Nutritional recommendations vary from person to person. Ask your health care provider what is healthy for you. Here are some general guidelines. Calcium Recommendations Calcium is the most important (essential) mineral for bone health. Most people can get enough calcium from their diet, but supplements may be recommended for people who are at risk for osteoporosis. Good sources of calcium include:  Dairy products, such as low-fat or nonfat milk, cheese, and yogurt.  Dark green leafy vegetables, such as bok choy and broccoli.  Calcium-fortified foods, such as orange juice, cereal, bread, soy beverages, and tofu products.  Nuts, such as almonds. Follow these recommended amounts for daily calcium intake:  Children, age 1-3: 700 mg.  Children, age 4-8: 1,000 mg.  Children, age 9-13: 1,300 mg.  Teens, age 14-18: 1,300 mg.  Adults, age 19-50: 1,000 mg.  Adults, age 51-70:  Men: 1,000 mg.  Women: 1,200 mg.  Adults, age 71 or older: 1,200 mg.  Pregnant and breastfeeding females:  Teens: 1,300 mg.  Adults: 1,000 mg. Vitamin D Recommendations Vitamin D is the most essential vitamin for bone health. It helps the body to absorb calcium. Sunlight stimulates the skin to make vitamin D, so be sure to get enough sunlight. If you live in a cold climate or you do not get outside often, your health care provider may recommend that you take vitamin D supplements. Good   sources of vitamin D in your diet include:  Egg yolks.  Saltwater fish.  Milk and cereal fortified with vitamin D. Follow these recommended amounts for daily vitamin D intake:  Children and teens, age 1-18: 600 international units.  Adults, age 50 or younger: 400-800 international units.  Adults, age 51 or older: 800-1,000 international units. Other Nutrients Other nutrients for bone  health include:  Phosphorus. This mineral is found in meat, poultry, dairy foods, nuts, and legumes. The recommended daily intake for adult men and adult women is 700 mg.  Magnesium. This mineral is found in seeds, nuts, dark green vegetables, and legumes. The recommended daily intake for adult men is 400-420 mg. For adult women, it is 310-320 mg.  Vitamin K. This vitamin is found in green leafy vegetables. The recommended daily intake is 120 mg for adult men and 90 mg for adult women. WHAT TYPE OF PHYSICAL ACTIVITY IS BEST FOR BUILDING AND MAINTAINING HEALTHY BONES? Weight-bearing and strength-building activities are important for building and maintaining peak bone mass. Weight-bearing activities cause muscles and bones to work against gravity. Strength-building activities increases muscle strength that supports bones. Weight-bearing and muscle-building activities include:  Walking and hiking.  Jogging and running.  Dancing.  Gym exercises.  Lifting weights.  Tennis and racquetball.  Climbing stairs.  Aerobics. Adults should get at least 30 minutes of moderate physical activity on most days. Children should get at least 60 minutes of moderate physical activity on most days. Ask your health care provide what type of exercise is best for you. WHERE CAN I FIND MORE INFORMATION? For more information, check out the following websites:  National Osteoporosis Foundation: http://nof.org/learn/basics  National Institutes of Health: http://www.niams.nih.gov/Health_Info/Bone/Bone_Health/bone_health_for_life.asp   This information is not intended to replace advice given to you by your health care provider. Make sure you discuss any questions you have with your health care provider.   Document Released: 04/30/2003 Document Revised: 06/24/2014 Document Reviewed: 02/12/2014 Elsevier Interactive Patient Education 2016 [employer]  

## 2015-11-05 LAB — CMP14+EGFR
A/G RATIO: 1.7 (ref 1.2–2.2)
ALBUMIN: 3.8 g/dL (ref 3.5–4.8)
ALT: 11 IU/L (ref 0–32)
AST: 19 IU/L (ref 0–40)
Alkaline Phosphatase: 63 IU/L (ref 39–117)
BILIRUBIN TOTAL: 0.8 mg/dL (ref 0.0–1.2)
BUN / CREAT RATIO: 10 — AB (ref 12–28)
BUN: 8 mg/dL (ref 8–27)
CHLORIDE: 105 mmol/L (ref 96–106)
CO2: 28 mmol/L (ref 18–29)
Calcium: 9.1 mg/dL (ref 8.7–10.3)
Creatinine, Ser: 0.84 mg/dL (ref 0.57–1.00)
GFR calc non Af Amer: 69 mL/min/{1.73_m2} (ref >59)
GFR, EST AFRICAN AMERICAN: 79 mL/min/{1.73_m2} (ref >59)
GLUCOSE: 85 mg/dL (ref 65–99)
Globulin, Total: 2.3 g/dL (ref 1.5–4.5)
Potassium: 4.4 mmol/L (ref 3.5–5.2)
Sodium: 147 mmol/L — ABNORMAL HIGH (ref 134–144)
TOTAL PROTEIN: 6.1 g/dL (ref 6.0–8.5)

## 2015-11-05 LAB — LIPID PANEL
Chol/HDL Ratio: 2.3 ratio units (ref 0.0–4.4)
Cholesterol, Total: 110 mg/dL (ref 100–199)
HDL: 47 mg/dL (ref >39)
LDL Calculated: 51 mg/dL (ref 0–99)
Triglycerides: 58 mg/dL (ref 0–149)
VLDL CHOLESTEROL CAL: 12 mg/dL (ref 5–40)

## 2015-11-18 ENCOUNTER — Other Ambulatory Visit: Payer: Medicare Other

## 2015-11-18 DIAGNOSIS — Z1212 Encounter for screening for malignant neoplasm of rectum: Principal | ICD-10-CM

## 2015-11-18 DIAGNOSIS — Z1211 Encounter for screening for malignant neoplasm of colon: Secondary | ICD-10-CM | POA: Diagnosis not present

## 2015-11-20 LAB — FECAL OCCULT BLOOD, IMMUNOCHEMICAL: Fecal Occult Bld: NEGATIVE

## 2015-12-01 ENCOUNTER — Telehealth: Payer: Self-pay | Admitting: Pharmacist

## 2015-12-01 NOTE — Telephone Encounter (Signed)
Prolia is due around 12/11/2015.  Appt made for patient.  Pt aware.

## 2015-12-04 DIAGNOSIS — Z23 Encounter for immunization: Secondary | ICD-10-CM | POA: Diagnosis not present

## 2015-12-15 ENCOUNTER — Ambulatory Visit (INDEPENDENT_AMBULATORY_CARE_PROVIDER_SITE_OTHER): Payer: Medicare Other

## 2015-12-15 ENCOUNTER — Ambulatory Visit (INDEPENDENT_AMBULATORY_CARE_PROVIDER_SITE_OTHER): Payer: Medicare Other | Admitting: Pharmacist

## 2015-12-15 ENCOUNTER — Encounter: Payer: Self-pay | Admitting: Pharmacist

## 2015-12-15 VITALS — BP 128/78 | HR 70 | Ht 61.0 in | Wt 107.5 lb

## 2015-12-15 DIAGNOSIS — M810 Age-related osteoporosis without current pathological fracture: Secondary | ICD-10-CM

## 2015-12-15 MED ORDER — DENOSUMAB 60 MG/ML ~~LOC~~ SOLN
60.0000 mg | Freq: Once | SUBCUTANEOUS | Status: AC
  Administered 2015-12-15: 60 mg via SUBCUTANEOUS

## 2015-12-15 NOTE — Patient Instructions (Signed)
Fall Prevention in the Home  Falls can cause injuries and can affect people from all age groups. There are many simple things that you can do to make your home safe and to help prevent falls. WHAT CAN I DO ON THE OUTSIDE OF MY HOME?  Regularly repair the edges of walkways and driveways and fix any cracks.  Remove high doorway thresholds.  Trim any shrubbery on the main path into your home.  Use bright outdoor lighting.  Clear walkways of debris and clutter, including tools and rocks.  Regularly check that handrails are securely fastened and in good repair. Both sides of any steps should have handrails.  Install guardrails along the edges of any raised decks or porches.  Have leaves, snow, and ice cleared regularly.  Use sand or salt on walkways during winter months.  In the garage, clean up any spills right away, including grease or oil spills. WHAT CAN I DO IN THE BATHROOM?  Use night lights.  Install grab bars by the toilet and in the tub and shower. Do not use towel bars as grab bars.  Use non-skid mats or decals on the floor of the tub or shower.  If you need to sit down while you are in the shower, use a plastic, non-slip stool..  Keep the floor dry. Immediately clean up any water that spills on the floor.  Remove soap buildup in the tub or shower on a regular basis.  Attach bath mats securely with double-sided non-slip rug tape.  Remove throw rugs and other tripping hazards from the floor. WHAT CAN I DO IN THE BEDROOM?  Use night lights.  Make sure that a bedside light is easy to reach.  Do not use oversized bedding that drapes onto the floor.  Have a firm chair that has side arms to use for getting dressed.  Remove throw rugs and other tripping hazards from the floor. WHAT CAN I DO IN THE KITCHEN?   Clean up any spills right away.  Avoid walking on wet floors.  Place frequently used items in easy-to-reach places.  If you need to reach for something  above you, use a sturdy step stool that has a grab bar.  Keep electrical cables out of the way.  Do not use floor polish or wax that makes floors slippery. If you have to use wax, make sure that it is non-skid floor wax.  Remove throw rugs and other tripping hazards from the floor. WHAT CAN I DO IN THE STAIRWAYS?  Do not leave any items on the stairs.  Make sure that there are handrails on both sides of the stairs. Fix handrails that are broken or loose. Make sure that handrails are as long as the stairways.  Check any carpeting to make sure that it is firmly attached to the stairs. Fix any carpet that is loose or worn.  Avoid having throw rugs at the top or bottom of stairways, or secure the rugs with carpet tape to prevent them from moving.  Make sure that you have a light switch at the top of the stairs and the bottom of the stairs. If you do not have them, have them installed. WHAT ARE SOME OTHER FALL PREVENTION TIPS?  Wear closed-toe shoes that fit well and support your feet. Wear shoes that have rubber soles or low heels.  When you use a stepladder, make sure that it is completely opened and that the sides are firmly locked. Have someone hold the ladder while you   are using it. Do not climb a closed stepladder.  Add color or contrast paint or tape to grab bars and handrails in your home. Place contrasting color strips on the first and last steps.  Use mobility aids as needed, such as canes, walkers, scooters, and crutches.  Turn on lights if it is dark. Replace any light bulbs that burn out.  Set up furniture so that there are clear paths. Keep the furniture in the same spot.  Fix any uneven floor surfaces.  Choose a carpet design that does not hide the edge of steps of a stairway.  Be aware of any and all pets.  Review your medicines with your healthcare provider. Some medicines can cause dizziness or changes in blood pressure, which increase your risk of falling. Talk  with your health care provider about other ways that you can decrease your risk of falls. This may include working with a physical therapist or trainer to improve your strength, balance, and endurance.   This information is not intended to replace advice given to you by your health care provider. Make sure you discuss any questions you have with your health care provider.   Document Released: 01/28/2002 Document Revised: 06/24/2014 Document Reviewed: 03/14/2014 Elsevier Interactive Patient Education 2016 [employer] Inc.  
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
## 2016-05-19 ENCOUNTER — Ambulatory Visit (INDEPENDENT_AMBULATORY_CARE_PROVIDER_SITE_OTHER): Payer: Medicare Other

## 2016-05-19 ENCOUNTER — Encounter: Payer: Self-pay | Admitting: Nurse Practitioner

## 2016-05-19 ENCOUNTER — Ambulatory Visit (INDEPENDENT_AMBULATORY_CARE_PROVIDER_SITE_OTHER): Payer: Medicare Other | Admitting: Nurse Practitioner

## 2016-05-19 VITALS — BP 110/65 | HR 61 | Temp 98.2°F | Ht 61.0 in | Wt 107.0 lb

## 2016-05-19 DIAGNOSIS — R636 Underweight: Secondary | ICD-10-CM | POA: Diagnosis not present

## 2016-05-19 DIAGNOSIS — M810 Age-related osteoporosis without current pathological fracture: Secondary | ICD-10-CM | POA: Diagnosis not present

## 2016-05-19 DIAGNOSIS — E559 Vitamin D deficiency, unspecified: Secondary | ICD-10-CM | POA: Diagnosis not present

## 2016-05-19 DIAGNOSIS — E7800 Pure hypercholesterolemia, unspecified: Secondary | ICD-10-CM

## 2016-05-19 DIAGNOSIS — I10 Essential (primary) hypertension: Secondary | ICD-10-CM

## 2016-05-19 MED ORDER — LISINOPRIL 10 MG PO TABS: 10.0000 mg | ORAL_TABLET | Freq: Every day | ORAL | 1 refills | Status: DC

## 2016-05-19 MED ORDER — ENSURE PLUS PO LIQD: 1.0000 | Freq: Every day | ORAL | 2 refills | Status: DC

## 2016-05-19 MED ORDER — ATORVASTATIN CALCIUM 80 MG PO TABS: 80.0000 mg | ORAL_TABLET | Freq: Every day | ORAL | 1 refills | Status: DC

## 2016-05-19 NOTE — Patient Instructions (Signed)
Medicare Annual Wellness Visit  [place] and the medical providers at [facility] strive to bring you the best medical care.  In doing so we not only want to address your current medical conditions and concerns but also to detect new conditions early and prevent illness, disease and health-related problems.    Medicare offers a yearly Wellness Visit which allows our clinical staff to assess your need for preventative services including immunizations, lifestyle education, counseling to decrease risk of preventable diseases and screening for fall risk and other medical concerns.    This visit is provided free of charge (no copay) for all Medicare recipients. The clinical pharmacists at [facility] have begun to conduct these Wellness Visits which will also include a thorough review of all your medications.    As you primary medical provider recommend that you make an appointment for your Annual Wellness Visit if you have not done so already this year.  You may set up this appointment before you leave today or you may call back ([phone]) and schedule an appointment.  Please make sure when you call that you mention that you are scheduling your Annual Wellness Visit with the clinical pharmacist so that the appointment may be made for the proper length of time.     Continue current medications. Continue good therapeutic lifestyle changes which include good diet and exercise. Fall precautions discussed with patient. If an FOBT was given today- please return it to our front desk. If you are over 50 years old - you may need Prevnar 13 or the adult Pneumonia vaccine.  **Flu shots are available--- please call and schedule a FLU-CLINIC appointment**  After your visit with us today you will receive a survey in the mail or online from [employer] regarding your care with us. Please take a moment to fill this out. Your feedback is very  important to us as you can help us better understand your patient needs as well as improve your experience and satisfaction. WE CARE ABOUT YOU!!!    

## 2016-05-19 NOTE — Progress Notes (Signed)
Subjective:    Patient ID: Robin [name], female    DOB: 1940-05-21, 76 y.o.   MRN: [id number]   Patient here today for follow up of chronic medical problems. No changes since last visit. No complaints today.  Outpatient Encounter Prescriptions as of 11/04/2015  Medication Sig  . aspirin EC 81 MG tablet Take 81 mg by mouth daily.  Marland Kitchen atorvastatin (LIPITOR) 80 MG tablet Take 1 tablet (80 mg total) by mouth daily.  . Calcium-Vitamin D (CALTRATE 600 PLUS-VIT D PO) Take 1 tablet by mouth daily.  . Cholecalciferol (VITAMIN D) 2000 UNITS tablet Take 4000IU on saturdays and sundays and 2000IU all other days.  Marland Kitchen denosumab (PROLIA) 60 MG/ML SOLN injection Bring to office for administration. Administer in upper arm, thigh, or abdomen  . ENSURE PLUS (ENSURE PLUS) LIQD Take 1 Can by mouth daily. Dx:  R63.6 (underweight)  . lisinopril (PRINIVIL,ZESTRIL) 10 MG tablet Take 1 tablet (10 mg total) by mouth daily.     Hyperlipidemia  This is a chronic problem. The current episode started more than 1 year ago. The problem is controlled. Recent lipid tests were reviewed and are normal. She has no history of diabetes, hypothyroidism or obesity. Pertinent negatives include no chest pain, myalgias or shortness of breath. The current treatment provides moderate improvement of lipids. Compliance problems include adherence to diet and adherence to exercise.  Risk factors for coronary artery disease include dyslipidemia.  Hypertension  This is a new problem. The current episode started more than 1 month ago. The problem is controlled. Pertinent negatives include no chest pain, malaise/fatigue, palpitations, peripheral edema or shortness of breath. Risk factors for coronary artery disease include dyslipidemia and post-menopausal state. Past treatments include ACE inhibitors. The current treatment provides significant improvement. There is no history of CAD/MI or CVA.  Vitamin D DEFICIENCY Vitamin D 2000 Iu OTC daily-  tolerating well osteoporosis Calcium and vitamin d daily- no c/o back pain.     Review of Systems  Constitutional: Negative for appetite change, malaise/fatigue and unexpected weight change.  Respiratory: Negative for shortness of breath.   Cardiovascular: Negative for chest pain, palpitations and leg swelling.  Genitourinary: Negative.   Musculoskeletal: Negative for myalgias.  Neurological: Negative.   All other systems reviewed and are negative.      Objective:   Physical Exam  Constitutional: She is oriented to person, place, and time. She appears well-developed and well-nourished.  HENT:  Nose: Nose normal.  Mouth/Throat: Oropharynx is clear and moist.  Eyes: EOM are normal.  Neck: Trachea normal, normal range of motion and full passive range of motion without pain. Neck supple. No JVD present. Carotid bruit is not present. No thyromegaly present.  Cardiovascular: Normal rate, regular rhythm, normal heart sounds and intact distal pulses.  Exam reveals no gallop and no friction rub.   No murmur heard. Pulmonary/Chest: Effort normal and breath sounds normal.  Abdominal: Soft. Bowel sounds are normal. She exhibits no distension and no mass. There is no tenderness.  Musculoskeletal: Normal range of motion.  Lymphadenopathy:    She has no cervical adenopathy.  Neurological: She is alert and oriented to person, place, and time. She has normal reflexes.  Skin: Skin is warm and dry.  Psychiatric: She has a normal mood and affect. Her behavior is normal. Judgment and thought content normal.   BP 110/65 (BP Location: Left Arm)   Pulse 61   Temp 98.2 F (36.8 C) (Oral)   Ht '5\' 1"'  (1.549 m)  Wt 107 lb (48.5 kg)   BMI 20.22 kg/m   EKG-NSR with PVC's-[name] Done, FNP      Assessment & Plan:  1. Pure hypercholesterolemia Low fat diet - EKG 12-Lead - DG Chest 2 View; Future - atorvastatin (LIPITOR) 80 MG tablet; Take 1 tablet (80 mg total) by mouth daily.   Dispense: 90 tablet; Refill: 1 - Lipid panel  2. Vitamin D insufficiency Labs pending  3. Essential hypertension Low sodium diet - EKG 12-Lead - DG Chest 2 View; Future - lisinopril (PRINIVIL,ZESTRIL) 10 MG tablet; Take 1 tablet (10 mg total) by mouth daily.  Dispense: 90 tablet; Refill: 1 - CMP14+EGFR - CBC with Differential/Platelet  4. Age related osteoporosis, unspecified pathological fracture presence Weight bearinr exercise Keep appointment for dexa next week  5. Underweight Continue dietary supplements - ENSURE PLUS (ENSURE PLUS) LIQD; Take 1 Can by mouth daily. Dx:  R63.6 (underweight)  Dispense: 7110 mL; Refill: 2 - Thyroid Panel With TSH    Labs pending Health maintenance reviewed Diet and exercise encouraged Continue all meds Follow up  In 3 months   [place], FNP

## 2016-05-20 LAB — CBC WITH DIFFERENTIAL/PLATELET
Basophils Absolute: 0 10*3/uL (ref 0.0–0.2)
Basos: 0 %
EOS (ABSOLUTE): 0.2 10*3/uL (ref 0.0–0.4)
EOS: 4 %
HEMATOCRIT: 34.5 % (ref 34.0–46.6)
HEMOGLOBIN: 11.1 g/dL (ref 11.1–15.9)
IMMATURE GRANS (ABS): 0 10*3/uL (ref 0.0–0.1)
IMMATURE GRANULOCYTES: 0 %
LYMPHS: 26 %
Lymphocytes Absolute: 1.3 10*3/uL (ref 0.7–3.1)
MCH: 28.9 pg (ref 26.6–33.0)
MCHC: 32.2 g/dL (ref 31.5–35.7)
MCV: 90 fL (ref 79–97)
MONOCYTES: 8 %
MONOS ABS: 0.4 10*3/uL (ref 0.1–0.9)
NEUTROS PCT: 62 %
Neutrophils Absolute: 2.9 10*3/uL (ref 1.4–7.0)
Platelets: 289 10*3/uL (ref 150–379)
RBC: 3.84 x10E6/uL (ref 3.77–5.28)
RDW: 14.3 % (ref 12.3–15.4)
WBC: 4.9 10*3/uL (ref 3.4–10.8)

## 2016-05-20 LAB — LIPID PANEL
Chol/HDL Ratio: 2.9 ratio units (ref 0.0–4.4)
Cholesterol, Total: 141 mg/dL (ref 100–199)
HDL: 48 mg/dL (ref >39)
LDL CALC: 78 mg/dL (ref 0–99)
TRIGLYCERIDES: 77 mg/dL (ref 0–149)
VLDL Cholesterol Cal: 15 mg/dL (ref 5–40)

## 2016-05-20 LAB — CMP14+EGFR
ALT: 8 IU/L (ref 0–32)
AST: 17 IU/L (ref 0–40)
Albumin/Globulin Ratio: 1.7 (ref 1.2–2.2)
Albumin: 4.3 g/dL (ref 3.5–4.8)
Alkaline Phosphatase: 68 IU/L (ref 39–117)
BILIRUBIN TOTAL: 0.6 mg/dL (ref 0.0–1.2)
BUN/Creatinine Ratio: 13 (ref 12–28)
BUN: 13 mg/dL (ref 8–27)
CHLORIDE: 101 mmol/L (ref 96–106)
CO2: 27 mmol/L (ref 18–29)
CREATININE: 0.97 mg/dL (ref 0.57–1.00)
Calcium: 10.3 mg/dL (ref 8.7–10.3)
GFR calc Af Amer: 67 mL/min/{1.73_m2} (ref >59)
GFR calc non Af Amer: 58 mL/min/{1.73_m2} — ABNORMAL LOW (ref >59)
GLOBULIN, TOTAL: 2.5 g/dL (ref 1.5–4.5)
GLUCOSE: 92 mg/dL (ref 65–99)
Potassium: 4.4 mmol/L (ref 3.5–5.2)
SODIUM: 142 mmol/L (ref 134–144)
Total Protein: 6.8 g/dL (ref 6.0–8.5)

## 2016-05-20 LAB — THYROID PANEL WITH TSH
Free Thyroxine Index: 1.7 (ref 1.2–4.9)
T3 UPTAKE RATIO: 23 % — AB (ref 24–39)
T4 TOTAL: 7.5 ug/dL (ref 4.5–12.0)
TSH: 2.16 u[IU]/mL (ref 0.450–4.500)

## 2016-06-09 ENCOUNTER — Encounter: Payer: Self-pay | Admitting: Pharmacist

## 2016-06-09 ENCOUNTER — Ambulatory Visit (INDEPENDENT_AMBULATORY_CARE_PROVIDER_SITE_OTHER): Payer: Medicare Other | Admitting: Pharmacist

## 2016-06-09 VITALS — BP 106/62 | HR 68 | Ht 61.0 in | Wt 107.5 lb

## 2016-06-09 DIAGNOSIS — Z0000 Encounter for general adult medical examination without abnormal findings: Secondary | ICD-10-CM | POA: Diagnosis not present

## 2016-06-09 DIAGNOSIS — Z1231 Encounter for screening mammogram for malignant neoplasm of breast: Secondary | ICD-10-CM

## 2016-06-09 NOTE — Patient Instructions (Addendum)
Robin [name] , Thank you for taking time to come for your Medicare Wellness Visit. I appreciate your ongoing commitment to your health goals. Please review the following plan we discussed and let me know if I can assist you in the future.   These are the goals we discussed:  I will send in referral for mammogram at [facility].   Continue to eat a balanced diet with protein, whole grains, fruits and vegetables.  Also continue to drink 1 Ensure daily.    Continue to exercise daily - great job!   This is a list of the screening recommended for you and due dates:  Health Maintenance  Topic Date Due  . Mammogram  12/23/2015  . Flu Shot  09/21/2016  . Stool Blood Test  11/17/2016  . DEXA scan (bone density measurement)  12/15/2017  . Colon Cancer Screening  07/18/2019  . Tetanus Vaccine  05/29/2024  . Pneumonia vaccines  Completed   Fall Prevention in the Home Falls can cause injuries and can affect people from all age groups. There are many simple things that you can do to make your home safe and to help prevent falls. What can I do on the outside of my home?  Regularly repair the edges of walkways and driveways and fix any cracks.  Remove high doorway thresholds.  Trim any shrubbery on the main path into your home.  Use bright outdoor lighting.  Clear walkways of debris and clutter, including tools and rocks.  Regularly check that handrails are securely fastened and in good repair. Both sides of any steps should have handrails.  Install guardrails along the edges of any raised decks or porches.  Have leaves, snow, and ice cleared regularly.  Use sand or salt on walkways during winter months.  In the garage, clean up any spills right away, including grease or oil spills. What can I do in the bathroom?  Use night lights.  Install grab bars by the toilet and in the tub and shower. Do not use towel bars as grab bars.  Use non-skid mats or decals on the floor of the tub or  shower.  If you need to sit down while you are in the shower, use a plastic, non-slip stool.  Keep the floor dry. Immediately clean up any water that spills on the floor.  Remove soap buildup in the tub or shower on a regular basis.  Attach bath mats securely with double-sided non-slip rug tape.  Remove throw rugs and other tripping hazards from the floor. What can I do in the bedroom?  Use night lights.  Make sure that a bedside light is easy to reach.  Do not use oversized bedding that drapes onto the floor.  Have a firm chair that has side arms to use for getting dressed.  Remove throw rugs and other tripping hazards from the floor. What can I do in the kitchen?  Clean up any spills right away.  Avoid walking on wet floors.  Place frequently used items in easy-to-reach places.  If you need to reach for something above you, use a sturdy step stool that has a grab bar.  Keep electrical cables out of the way.  Do not use floor polish or wax that makes floors slippery. If you have to use wax, make sure that it is non-skid floor wax.  Remove throw rugs and other tripping hazards from the floor. What can I do in the stairways?  Do not leave any items on  the stairs.  Make sure that there are handrails on both sides of the stairs. Fix handrails that are broken or loose. Make sure that handrails are as long as the stairways.  Check any carpeting to make sure that it is firmly attached to the stairs. Fix any carpet that is loose or worn.  Avoid having throw rugs at the top or bottom of stairways, or secure the rugs with carpet tape to prevent them from moving.  Make sure that you have a light switch at the top of the stairs and the bottom of the stairs. If you do not have them, have them installed. What are some other fall prevention tips?  Wear closed-toe shoes that fit well and support your feet. Wear shoes that have rubber soles or low heels.  When you use a  stepladder, make sure that it is completely opened and that the sides are firmly locked. Have someone hold the ladder while you are using it. Do not climb a closed stepladder.  Add color or contrast paint or tape to grab bars and handrails in your home. Place contrasting color strips on the first and last steps.  Use mobility aids as needed, such as canes, walkers, scooters, and crutches.  Turn on lights if it is dark. Replace any light bulbs that burn out.  Set up furniture so that there are clear paths. Keep the furniture in the same spot.  Fix any uneven floor surfaces.  Choose a carpet design that does not hide the edge of steps of a stairway.  Be aware of any and all pets.  Review your medicines with your healthcare provider. Some medicines can cause dizziness or changes in blood pressure, which increase your risk of falling. Talk with your health care provider about other ways that you can decrease your risk of falls. This may include working with a physical therapist or trainer to improve your strength, balance, and endurance. This information is not intended to replace advice given to you by your health care provider. Make sure you discuss any questions you have with your health care provider. Document Released: 01/28/2002 Document Revised: 07/07/2015 Document Reviewed: 03/14/2014 Elsevier Interactive Patient Education  2017 [employer].

## 2016-06-09 NOTE — Progress Notes (Signed)
Patient ID: Robin [name], female   DOB: [birth year], 76 y.o.   MRN: [id number]     Subjective:   Robin [name] is a 76 y.o. female who presents for a subsequent Medicare Annual Wellness Visit.  Robin [name] is widowed.  She lives alone.  She has 6 adult children.  She has recent learned that her daughter has Huntington's Disease and she is trying to help care for her.  She finds that this is sometimes stressful because her daughters memory be being effected.  Robin [name] is retired.  She states that she remains active and does not have any medication complaints today   Current Medications (verified) Outpatient Encounter Prescriptions as of 06/09/2016  Medication Sig  . aspirin EC 81 MG tablet Take 81 mg by mouth daily.  Marland Kitchen atorvastatin (LIPITOR) 80 MG tablet Take 1 tablet (80 mg total) by mouth daily.  . Calcium-Vitamin D (CALTRATE 600 PLUS-VIT D PO) Take 1 tablet by mouth daily.  . Cholecalciferol (VITAMIN D) 2000 UNITS tablet Take 4000IU on saturdays and sundays and 2000IU all other days.  Marland Kitchen denosumab (PROLIA) 60 MG/ML SOLN injection Bring to office for administration. Administer in upper arm, thigh, or abdomen  . ENSURE PLUS (ENSURE PLUS) LIQD Take 1 Can by mouth daily. Dx:  R63.6 (underweight)  . lisinopril (PRINIVIL,ZESTRIL) 10 MG tablet Take 1 tablet (10 mg total) by mouth daily.   No facility-administered encounter medications on file as of 06/09/2016.     Allergies (verified) Patient has no known allergies.   History: Past Medical History:  Diagnosis Date  . 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 Atrial fibrillation ([place])   . Cataract   . Hyperlipidemia   . Hypertension   . Osteoporosis    history of femur fracture  . Shingles   . Vitamin D deficiency    Past Surgical History:  Procedure Laterality Date  . 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 COLONOSCOPY  April 2007   Dr.  Sharlett Iles: 5cm circumferential fungating tumor at the hepatic flexure, path tubulovillous adenoma.   . COLONOSCOPY  Jan 02, 2006   Dr. 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 Multiple polyps around anastomosis of different sizes,   . COLONOSCOPY  2008   Dr. Sharlett Iles. Referred to Dr. Georgette Dover for resection  . COLONOSCOPY N/A 07/18/2014   Procedure: COLONOSCOPY;  Surgeon: Daneil Dolin, MD;  [facility];  Service: Endoscopy;  Laterality: N/A;  1230  . EYE SURGERY    . LAPAROSCOPIC LYSIS OF ADHESIONS    . PARTIAL COLECTOMY  2007   due to tubulovillous adenoma  . TUBAL LIGATION     Family History  Problem Relation Age of Onset  . Diabetes Mother   . Heart disease Mother   . Cancer Mother     patient unsure of type  . Hip fracture Mother   . Heart disease Father     antigioplasty  . Cancer Sister     lung  . COPD Sister   . Heart disease Sister   . Atrial fibrillation Sister   . Heart disease Sister     stent in left leg  . Varicose Veins Sister   . Deep vein thrombosis Sister   . Heart disease Brother   . COPD Brother   . COPD Brother   . Huntington's disease Daughter   . Carpal tunnel syndrome  Daughter   . Brain cancer Son   . Colon cancer Neg Hx    Social History   Occupational History  . Not on file.   Social History Main Topics  . Smoking status: Former Smoker    Years: 1.00    Types: Cigarettes    Quit date: 11/01/1959  . Smokeless tobacco: Never Used  . Alcohol use No  . Drug use: No  . Sexual activity: No    Do you feel safe at home?  Yes Are there smokers in your home (other than you)? No  Dietary issues and exercise activities: Current Exercise Habits: Home exercise routine, Type of exercise: walking, Time (Minutes): 60, Frequency (Times/Week): 7, Weekly Exercise (Minutes/Week): 420, Intensity: Moderate  Current Dietary habits:  Drinks 1 Ensure a day as a snack.  Eats 3 meals a day.    Objective:      Today's Vitals   06/09/16 1027  BP: 106/62  Pulse: 68  Weight: 107 lb 8 oz (48.8 kg)  Height: 5\' 1"  (1.549 m)  PainSc: 0-No pain   Body mass index is 20.31 kg/m.  Activities of Daily Living In your present state of health, do you have any difficulty performing the following activities: 06/09/2016  Hearing? N  Vision? N  Difficulty concentrating or making decisions? N  Walking or climbing stairs? N  Dressing or bathing? N  Doing errands, shopping? N  Preparing Food and eating ? N  Using the Toilet? N  In the past six months, have you accidently leaked urine? N  Do you have problems with loss of bowel control? N  Managing your Medications? N  Managing your Finances? N  Housekeeping or managing your Housekeeping? N  Some recent data might be hidden     Cardiac Risk Factors include: advanced age (>18men, >56 women);dyslipidemia;family history of premature cardiovascular disease;hypertension  Depression Screen PHQ 2/9 Scores 06/09/2016 05/19/2016 11/04/2015 06/08/2015  PHQ - 2 Score 0 0 0 0  PHQ- 9 Score - - - -     Fall Risk Fall Risk  06/09/2016 05/19/2016 11/04/2015 06/08/2015 12/04/2014  Falls in the past year? No No No No No  Risk for fall due to : - - - - -    Cognitive Function: MMSE - Mini Mental State Exam 06/09/2016 06/08/2015 12/04/2014 05/30/2014  Orientation to time 5 5 5 5   Orientation to Place 5 5 5 5   Registration 3 3 3 3   Attention/ Calculation 5 4 5 5   Recall 3 3 3 3   Language- name 2 objects 2 2 2 2   Language- repeat 1 1 1 1   Language- follow 3 step command 3 3 3 3   Language- read & follow direction 1 1 1 1   Write a sentence 1 1 1 1   Copy design 1 1 1 1   Total score 30 29 30 30     Immunizations and Health Maintenance Immunization History  Administered Date(s) Administered  . Influenza,inj,Quad PF,36+ Mos 12/05/2012, 11/28/2013, 12/04/2014  . Pneumococcal Conjugate-13 05/30/2014  . Pneumococcal Polysaccharide-23 12/05/2012  . Tdap 05/30/2014  .  Zoster 10/03/2014   Health Maintenance Due  Topic Date Due  . MAMMOGRAM  12/23/2015    Patient Care Team: Chevis Pretty, FNP as PCP - General (Nurse Practitioner) Daneil Dolin, MD as Consulting Physician (Gastroenterology)  Indicate any recent Medical Services you may have received from other than [facility] providers in the past year (date may be approximate).    Assessment:    Annual Wellness  Visit    Screening Tests Health Maintenance  Topic Date Due  . MAMMOGRAM  12/23/2015  . INFLUENZA VACCINE  09/21/2016  . COLON CANCER SCREENING ANNUAL FOBT  11/17/2016  . DEXA SCAN  12/15/2017  . COLONOSCOPY  07/18/2019  . TETANUS/TDAP  05/29/2024  . PNA vac Low Risk Adult  Completed        Plan:   During the course of the visit Sarahann was educated and counseled about the following appropriate screening and preventive services:   Vaccines to include Pneumoccal, Influenza, Td and Shingles - UTD  Colorectal cancer screening - UTD - gets every 5 years  Cardiovascular disease screening - EKG is UTD  Diabetes screening - UTD and normal  Bone Denisty / Osteoporosis Screening - UTD  Mammogram - ordered today  Glaucoma screening / Eye Exam - UTD  Nutrition counseling - continue Ensure daily - weight is stable.    Advanced Directives - UTD  Physical Activity - continue to walk daily  Orders Placed This Encounter  Procedures  . MM Digital Screening    Order Specific Question:   Reason for Exam (SYMPTOM  OR DIAGNOSIS REQUIRED)    Answer:   breast cancer screening - abnormal mammo 2016    Order Specific Question:   Preferred imaging location?    Answer:   [facility]      Patient Instructions (the written plan) were given to the patient.   Cherre Robins, PharmD   06/09/2016

## 2016-06-10 ENCOUNTER — Ambulatory Visit (INDEPENDENT_AMBULATORY_CARE_PROVIDER_SITE_OTHER): Payer: Medicare Other | Admitting: Pharmacist

## 2016-06-10 DIAGNOSIS — M810 Age-related osteoporosis without current pathological fracture: Secondary | ICD-10-CM

## 2016-06-10 MED ORDER — DENOSUMAB 60 MG/ML ~~LOC~~ SOLN
60.0000 mg | Freq: Once | SUBCUTANEOUS | Status: AC
  Administered 2016-06-10: 60 mg via SUBCUTANEOUS
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
## 2016-08-01 DIAGNOSIS — Z1231 Encounter for screening mammogram for malignant neoplasm of breast: Secondary | ICD-10-CM | POA: Diagnosis not present

## 2016-09-28 ENCOUNTER — Telehealth: Payer: Self-pay | Admitting: Nurse Practitioner

## 2016-09-28 NOTE — Telephone Encounter (Signed)
No answer, attempts will be made later.

## 2016-09-28 NOTE — Telephone Encounter (Signed)
Pt does not need a DEXA scan-last oone 12/15/2015, however she will be in need of a Prolia injection in October. Last injection was 06/10/2016. Can you please assist her with this?

## 2016-09-28 NOTE — Telephone Encounter (Signed)
As we always do - will call patient to schedule appt for Prolia when it gets closer to when due.  Please let her know someone will contact her around mid September to schedule appt.

## 2016-10-12 NOTE — Telephone Encounter (Signed)
Pt aware.

## 2016-11-23 ENCOUNTER — Telehealth: Payer: Self-pay | Admitting: Nurse Practitioner

## 2016-11-23 NOTE — Telephone Encounter (Signed)
Pt scheduled for 12/06/16 at 9:30. She will also need her Prolia shot. Are you handling these?

## 2016-12-06 ENCOUNTER — Ambulatory Visit (INDEPENDENT_AMBULATORY_CARE_PROVIDER_SITE_OTHER): Payer: Medicare Other

## 2016-12-06 DIAGNOSIS — Z23 Encounter for immunization: Secondary | ICD-10-CM

## 2016-12-06 DIAGNOSIS — M810 Age-related osteoporosis without current pathological fracture: Secondary | ICD-10-CM | POA: Diagnosis not present

## 2016-12-08 ENCOUNTER — Other Ambulatory Visit: Payer: Self-pay | Admitting: Nurse Practitioner

## 2016-12-08 ENCOUNTER — Telehealth: Payer: Self-pay | Admitting: Nurse Practitioner

## 2016-12-08 DIAGNOSIS — I10 Essential (primary) hypertension: Secondary | ICD-10-CM

## 2016-12-08 NOTE — Telephone Encounter (Signed)
What is the name of the medication? lisinopril (PRINIVIL,ZESTRIL) 10 MG tablet prolia  Have you contacted your pharmacy to request a refill? yes  Which pharmacy would you like this sent to? [facility] in [place]   Patient notified that their request is being sent to the clinical staff for review and that they should receive a call once it is complete. If they do not receive a call within 24 hours they can check with their pharmacy or our office.

## 2016-12-08 NOTE — Telephone Encounter (Signed)
Refill request pending, just waiting for approval. Will close encounter.

## 2016-12-12 ENCOUNTER — Other Ambulatory Visit: Payer: Self-pay | Admitting: *Deleted

## 2016-12-12 ENCOUNTER — Ambulatory Visit (INDEPENDENT_AMBULATORY_CARE_PROVIDER_SITE_OTHER): Payer: Medicare Other | Admitting: *Deleted

## 2016-12-12 DIAGNOSIS — M810 Age-related osteoporosis without current pathological fracture: Secondary | ICD-10-CM | POA: Diagnosis not present

## 2016-12-12 DIAGNOSIS — I10 Essential (primary) hypertension: Secondary | ICD-10-CM

## 2016-12-12 MED ORDER — DENOSUMAB 60 MG/ML ~~LOC~~ SOLN
60.0000 mg | Freq: Once | SUBCUTANEOUS | Status: AC
  Administered 2016-12-12: 60 mg via SUBCUTANEOUS

## 2016-12-12 MED ORDER — LISINOPRIL 10 MG PO TABS: 10.0000 mg | ORAL_TABLET | Freq: Every day | ORAL | 0 refills | Status: DC

## 2016-12-12 NOTE — Progress Notes (Signed)
Pt given Prolia inj L arm Tolerated well

## 2016-12-23 ENCOUNTER — Encounter: Payer: Self-pay | Admitting: Nurse Practitioner

## 2016-12-23 ENCOUNTER — Ambulatory Visit (INDEPENDENT_AMBULATORY_CARE_PROVIDER_SITE_OTHER): Payer: Medicare Other | Admitting: Nurse Practitioner

## 2016-12-23 VITALS — BP 120/76 | HR 74 | Temp 97.7°F | Ht 61.0 in | Wt 107.0 lb

## 2016-12-23 DIAGNOSIS — R636 Underweight: Secondary | ICD-10-CM | POA: Diagnosis not present

## 2016-12-23 DIAGNOSIS — I10 Essential (primary) hypertension: Secondary | ICD-10-CM | POA: Diagnosis not present

## 2016-12-23 DIAGNOSIS — E559 Vitamin D deficiency, unspecified: Secondary | ICD-10-CM | POA: Diagnosis not present

## 2016-12-23 DIAGNOSIS — E7800 Pure hypercholesterolemia, unspecified: Secondary | ICD-10-CM | POA: Diagnosis not present

## 2016-12-23 MED ORDER — ATORVASTATIN CALCIUM 80 MG PO TABS: 80.0000 mg | ORAL_TABLET | Freq: Every day | ORAL | 1 refills | Status: DC

## 2016-12-23 MED ORDER — LISINOPRIL 10 MG PO TABS: 10.0000 mg | ORAL_TABLET | Freq: Every day | ORAL | 1 refills | Status: DC

## 2016-12-23 NOTE — Patient Instructions (Signed)
Bone Health Bones protect organs, store calcium, and anchor muscles. Good health habits, such as eating nutritious foods and exercising regularly, are important for maintaining healthy bones. They can also help to prevent a condition that causes bones to lose density and become weak and brittle (osteoporosis). Why is bone mass important? Bone mass refers to the amount of bone tissue that you have. The higher your bone mass, the stronger your bones. An important step toward having healthy bones throughout life is to have strong and dense bones during childhood. A young adult who has a high bone mass is more likely to have a high bone mass later in life. Bone mass at its greatest it is called peak bone mass. A large decline in bone mass occurs in older adults. In women, it occurs about the time of menopause. During this time, it is important to practice good health habits, because if more bone is lost than what is replaced, the bones will become less healthy and more likely to break (fracture). If you find that you have a low bone mass, you may be able to prevent osteoporosis or further bone loss by changing your diet and lifestyle. How can I find out if my bone mass is low? Bone mass can be measured with an X-ray test that is called a bone mineral density (BMD) test. This test is recommended for all women who are age 65 or older. It may also be recommended for men who are age 70 or older, or for people who are more likely to develop osteoporosis due to:  Having bones that break easily.  Having a long-term disease that weakens bones, such as kidney disease or rheumatoid arthritis.  Having menopause earlier than normal.  Taking medicine that weakens bones, such as steroids, thyroid hormones, or hormone treatment for breast cancer or prostate cancer.  Smoking.  Drinking three or more alcoholic drinks each day.  What are the nutritional recommendations for healthy bones? To have healthy bones, you  need to get enough of the right minerals and vitamins. Most nutrition experts recommend getting these nutrients from the foods that you eat. Nutritional recommendations vary from person to person. Ask your health care provider what is healthy for you. Here are some general guidelines. Calcium Recommendations Calcium is the most important (essential) mineral for bone health. Most people can get enough calcium from their diet, but supplements may be recommended for people who are at risk for osteoporosis. Good sources of calcium include:  Dairy products, such as low-fat or nonfat milk, cheese, and yogurt.  Dark green leafy vegetables, such as bok choy and broccoli.  Calcium-fortified foods, such as orange juice, cereal, bread, soy beverages, and tofu products.  Nuts, such as almonds.  Follow these recommended amounts for daily calcium intake:  Children, age 1?3: 700 mg.  Children, age 4?8: 1,000 mg.  Children, age 9?13: 1,300 mg.  Teens, age 14?18: 1,300 mg.  Adults, age 19?50: 1,000 mg.  Adults, age 51?70: ? Men: 1,000 mg. ? Women: 1,200 mg.  Adults, age 71 or older: 1,200 mg.  Pregnant and breastfeeding females: ? Teens: 1,300 mg. ? Adults: 1,000 mg.  Vitamin D Recommendations Vitamin D is the most essential vitamin for bone health. It helps the body to absorb calcium. Sunlight stimulates the skin to make vitamin D, so be sure to get enough sunlight. If you live in a cold climate or you do not get outside often, your health care provider may recommend that you take vitamin   D supplements. Good sources of vitamin D in your diet include:  Egg yolks.  Saltwater fish.  Milk and cereal fortified with vitamin D.  Follow these recommended amounts for daily vitamin D intake:  Children and teens, age 1?18: 600 international units.  Adults, age 50 or younger: 400-800 international units.  Adults, age 51 or older: 800-1,000 international units.  Other Nutrients Other nutrients  for bone health include:  Phosphorus. This mineral is found in meat, poultry, dairy foods, nuts, and legumes. The recommended daily intake for adult men and adult women is 700 mg.  Magnesium. This mineral is found in seeds, nuts, dark green vegetables, and legumes. The recommended daily intake for adult men is 400?420 mg. For adult women, it is 310?320 mg.  Vitamin K. This vitamin is found in green leafy vegetables. The recommended daily intake is 120 mg for adult men and 90 mg for adult women.  What type of physical activity is best for building and maintaining healthy bones? Weight-bearing and strength-building activities are important for building and maintaining peak bone mass. Weight-bearing activities cause muscles and bones to work against gravity. Strength-building activities increases muscle strength that supports bones. Weight-bearing and muscle-building activities include:  Walking and hiking.  Jogging and running.  Dancing.  Gym exercises.  Lifting weights.  Tennis and racquetball.  Climbing stairs.  Aerobics.  Adults should get at least 30 minutes of moderate physical activity on most days. Children should get at least 60 minutes of moderate physical activity on most days. Ask your health care provide what type of exercise is best for you. Where can I find more information? For more information, check out the following websites:  National Osteoporosis Foundation: http://nof.org/learn/basics  National Institutes of Health: http://www.niams.nih.gov/Health_Info/Bone/Bone_Health/bone_health_for_life.asp  This information is not intended to replace advice given to you by your health care provider. Make sure you discuss any questions you have with your health care provider. Document Released: 04/30/2003 Document Revised: 08/28/2015 Document Reviewed: 02/12/2014 Elsevier Interactive Patient Education  2018 Elsevier Inc.  

## 2016-12-23 NOTE — Progress Notes (Signed)
Subjective:    Patient ID: Robin [name], female    DOB: 1941-01-28, 76 y.o.   MRN: [id number]  HPI  Robin [name] is here today for follow up of chronic medical problem.  Outpatient Encounter Prescriptions as of 12/23/2016  Medication Sig  . aspirin EC 81 MG tablet Take 81 mg by mouth daily.  Marland Kitchen atorvastatin (LIPITOR) 80 MG tablet Take 1 tablet (80 mg total) by mouth daily.  . Calcium-Vitamin D (CALTRATE 600 PLUS-VIT D PO) Take 1 tablet by mouth daily.  . Cholecalciferol (VITAMIN D) 2000 UNITS tablet Take 4000IU on saturdays and sundays and 2000IU all other days.  Marland Kitchen denosumab (PROLIA) 60 MG/ML SOLN injection Bring to office for administration. Administer in upper arm, thigh, or abdomen  . ENSURE PLUS (ENSURE PLUS) LIQD Take 1 Can by mouth daily. Dx:  R63.6 (underweight)  . lisinopril (PRINIVIL,ZESTRIL) 10 MG tablet Take 1 tablet (10 mg total) by mouth daily.   No facility-administered encounter medications on file as of 12/23/2016.     1. Essential hypertension  No c/o chest pain, SOB or headache. She does not check blood pressure at home. BP Readings from Last 3 Encounters:  06/09/16 106/62  05/19/16 110/65  12/15/15 128/78     2. Vitamin D insufficiency  Takes vitamin d OTC when she remembers to takae  3. Underweight  No weight changes  4. Pure hypercholesterolemia  Is not a big eater. Mainly eats what she wants to.    New complaints: None today  Social history: Lives by herself- has family close by that visits often- lives in a retirement facility   Review of Systems  Constitutional: Negative for activity change and appetite change.  HENT: Negative.   Eyes: Negative for pain.  Respiratory: Negative for shortness of breath.   Cardiovascular: Negative for chest pain, palpitations and leg swelling.  Gastrointestinal: Negative for abdominal pain.  Endocrine: Negative for polydipsia.  Genitourinary: Negative.   Skin: Negative for rash.  Neurological: Negative  for dizziness, weakness and headaches.  Hematological: Does not bruise/bleed easily.  Psychiatric/Behavioral: Negative.   All other systems reviewed and are negative.      Objective:   Physical Exam  Constitutional: She is oriented to person, place, and time. She appears well-developed and well-nourished.  HENT:  Nose: Nose normal.  Mouth/Throat: Oropharynx is clear and moist.  Eyes: EOM are normal.  Neck: Trachea normal, normal range of motion and full passive range of motion without pain. Neck supple. No JVD present. Carotid bruit is not present. No thyromegaly present.  Cardiovascular: Normal rate, regular rhythm, normal heart sounds and intact distal pulses.  Exam reveals no gallop and no friction rub.   No murmur heard. Audible ectopic beats- last EKG showed PVC's  Pulmonary/Chest: Effort normal and breath sounds normal.  Abdominal: Soft. Bowel sounds are normal. She exhibits no distension and no mass. There is no tenderness.  Musculoskeletal: Normal range of motion.  Lymphadenopathy:    She has no cervical adenopathy.  Neurological: She is alert and oriented to person, place, and time. She has normal reflexes.  Skin: Skin is warm and dry.  Psychiatric: She has a normal mood and affect. Her behavior is normal. Judgment and thought content normal.   BP 120/76   Pulse 74   Temp 97.7 F (36.5 C) (Oral)   Ht '5\' 1"'  (1.549 m)   Wt 107 lb (48.5 kg)   BMI 20.22 kg/m         Assessment &  Plan:  1. Essential hypertension Low sodium diet - lisinopril (PRINIVIL,ZESTRIL) 10 MG tablet; Take 1 tablet (10 mg total) by mouth daily.  Dispense: 90 tablet; Refill: 1 - CMP14+EGFR  2. Vitamin D insufficiency Continue daily vitamin d   3. Underweight Increase calories in diet  4. Pure hypercholesterolemia Low fat diet - atorvastatin (LIPITOR) 80 MG tablet; Take 1 tablet (80 mg total) by mouth daily.  Dispense: 90 tablet; Refill: 1 - Lipid panel    Labs pending Health  maintenance reviewed Diet and exercise encouraged Continue all meds Follow up  In 6 months   [place], FNP

## 2016-12-24 LAB — CMP14+EGFR
A/G RATIO: 1.7 (ref 1.2–2.2)
ALT: 9 IU/L (ref 0–32)
AST: 15 IU/L (ref 0–40)
Albumin: 4 g/dL (ref 3.5–4.8)
Alkaline Phosphatase: 61 IU/L (ref 39–117)
BUN/Creatinine Ratio: 9 — ABNORMAL LOW (ref 12–28)
BUN: 10 mg/dL (ref 8–27)
Bilirubin Total: 0.6 mg/dL (ref 0.0–1.2)
CALCIUM: 9.6 mg/dL (ref 8.7–10.3)
CO2: 27 mmol/L (ref 20–29)
Chloride: 101 mmol/L (ref 96–106)
Creatinine, Ser: 1.08 mg/dL — ABNORMAL HIGH (ref 0.57–1.00)
GFR, EST AFRICAN AMERICAN: 58 mL/min/{1.73_m2} — AB (ref >59)
GFR, EST NON AFRICAN AMERICAN: 50 mL/min/{1.73_m2} — AB (ref >59)
GLOBULIN, TOTAL: 2.4 g/dL (ref 1.5–4.5)
Glucose: 103 mg/dL — ABNORMAL HIGH (ref 65–99)
POTASSIUM: 3.5 mmol/L (ref 3.5–5.2)
SODIUM: 143 mmol/L (ref 134–144)
TOTAL PROTEIN: 6.4 g/dL (ref 6.0–8.5)

## 2016-12-24 LAB — LIPID PANEL
CHOL/HDL RATIO: 2.4 ratio (ref 0.0–4.4)
Cholesterol, Total: 115 mg/dL (ref 100–199)
HDL: 48 mg/dL (ref >39)
LDL CALC: 42 mg/dL (ref 0–99)
Triglycerides: 123 mg/dL (ref 0–149)
VLDL Cholesterol Cal: 25 mg/dL (ref 5–40)

## 2017-01-05 DIAGNOSIS — M1990 Unspecified osteoarthritis, unspecified site: Secondary | ICD-10-CM | POA: Diagnosis not present

## 2017-01-05 DIAGNOSIS — Z85038 Personal history of other malignant neoplasm of large intestine: Secondary | ICD-10-CM | POA: Diagnosis not present

## 2017-01-05 DIAGNOSIS — M545 Low back pain: Secondary | ICD-10-CM | POA: Diagnosis not present

## 2017-01-05 DIAGNOSIS — Z7982 Long term (current) use of aspirin: Secondary | ICD-10-CM | POA: Diagnosis not present

## 2017-01-05 DIAGNOSIS — M4126 Other idiopathic scoliosis, lumbar region: Secondary | ICD-10-CM | POA: Diagnosis not present

## 2017-01-05 DIAGNOSIS — E7800 Pure hypercholesterolemia, unspecified: Secondary | ICD-10-CM | POA: Diagnosis not present

## 2017-01-05 DIAGNOSIS — M419 Scoliosis, unspecified: Secondary | ICD-10-CM | POA: Diagnosis not present

## 2017-01-05 DIAGNOSIS — Z79899 Other long term (current) drug therapy: Secondary | ICD-10-CM | POA: Diagnosis not present

## 2017-04-04 ENCOUNTER — Telehealth: Payer: Self-pay | Admitting: Nurse Practitioner

## 2017-04-04 DIAGNOSIS — I10 Essential (primary) hypertension: Secondary | ICD-10-CM

## 2017-04-04 MED ORDER — LISINOPRIL 10 MG PO TABS: 10.0000 mg | ORAL_TABLET | Freq: Every day | ORAL | 0 refills | Status: DC

## 2017-04-04 NOTE — Telephone Encounter (Signed)
Pt aware refill sent to pharmacy 

## 2017-04-04 NOTE — Telephone Encounter (Signed)
What is the name of the medication? lisinopril (PRINIVIL,ZESTRIL) 10 MG tablet  Have you contacted your pharmacy to request a refill? no  Which pharmacy would you like this sent to? [facility] 90 day supply   Patient notified that their request is being sent to the clinical staff for review and that they should receive a call once it is complete. If they do not receive a call within 24 hours they can check with their pharmacy or our office.

## 2017-06-02 ENCOUNTER — Telehealth: Payer: Self-pay | Admitting: Nurse Practitioner

## 2017-06-02 NOTE — Telephone Encounter (Signed)
Please advise 

## 2017-06-22 ENCOUNTER — Encounter: Payer: Self-pay | Admitting: Nurse Practitioner

## 2017-06-22 ENCOUNTER — Ambulatory Visit (INDEPENDENT_AMBULATORY_CARE_PROVIDER_SITE_OTHER): Payer: Medicare Other | Admitting: Nurse Practitioner

## 2017-06-22 VITALS — BP 98/56 | HR 55 | Temp 97.1°F | Ht 61.0 in | Wt 101.0 lb

## 2017-06-22 DIAGNOSIS — E7800 Pure hypercholesterolemia, unspecified: Secondary | ICD-10-CM

## 2017-06-22 DIAGNOSIS — E559 Vitamin D deficiency, unspecified: Secondary | ICD-10-CM | POA: Diagnosis not present

## 2017-06-22 DIAGNOSIS — R636 Underweight: Secondary | ICD-10-CM

## 2017-06-22 DIAGNOSIS — I10 Essential (primary) hypertension: Secondary | ICD-10-CM

## 2017-06-22 DIAGNOSIS — M810 Age-related osteoporosis without current pathological fracture: Secondary | ICD-10-CM

## 2017-06-22 MED ORDER — ENSURE PLUS PO LIQD: 1.0000 | Freq: Every day | ORAL | 2 refills | Status: DC

## 2017-06-22 MED ORDER — LISINOPRIL 10 MG PO TABS: 10.0000 mg | ORAL_TABLET | Freq: Every day | ORAL | 1 refills | Status: DC

## 2017-06-22 MED ORDER — ATORVASTATIN CALCIUM 80 MG PO TABS: 80.0000 mg | ORAL_TABLET | Freq: Every day | ORAL | 1 refills | Status: DC

## 2017-06-22 NOTE — Addendum Note (Signed)
Addended by: Rolena Infante on: 06/22/2017 04:33 PM   Modules accepted: Orders

## 2017-06-22 NOTE — Patient Instructions (Signed)
 High-Protein and High-Calorie Diet Eating high-protein and high-calorie foods can help you to gain weight, heal after an injury, and recover after an illness or surgery. What is my plan? The specific amount of daily protein and calories you need depends on:  Your body weight.  The reason this diet is recommended for you.  Generally, a high-protein, high-calorie diet involves:  Eating 250-500 extra calories each day.  Making sure that 10-35% of your daily calories come from protein.  Talk to your health care provider about how much protein and how many calories you need each day. Follow the diet as directed by your health care provider. What do I need to know about this diet?  Ask your health care provider if you should take a nutritional supplement.  Try to eat six small meals each day instead of three large meals.  Eat a balanced diet, including one food that is high in protein at each meal.  Keep nutritious snacks handy, such as nuts, trail mixes, dried fruit, and yogurt.  If you have kidney disease or diabetes, eating too much protein may put extra stress on your kidneys. Talk to your health care provider if you have either of those conditions. What are some high-protein foods? Grains Quinoa. Bulgur wheat. Vegetables Soybeans. Peas. Meats and Other Protein Sources Beef, pork, and poultry. Fish and seafood. Eggs. Tofu. Textured vegetable protein (TVP). Peanut butter. Nuts and seeds. Dried beans. Protein powders. Dairy Whole milk. Whole-milk yogurt. Powdered milk. Cheese. Cottage Cheese. Eggnog. Beverages High-protein supplement drinks. Soy milk. Other Protein bars. The items listed above may not be a complete list of recommended foods or beverages. Contact your dietitian for more options. What are some high-calorie foods? Grains Pasta. Quick breads. Muffins. Pancakes. Ready-to-eat cereal. Vegetables Vegetables cooked in oil or butter. Fried potatoes. Fruits Dried  fruit. Fruit leather. Canned fruit in syrup. Fruit juice. Avocados. Meats and Other Protein Sources Peanut butter. Nuts and seeds. Dairy Heavy cream. Whipped cream. Cream cheese. Sour cream. Ice cream. Custard. Pudding. Beverages Meal-replacement beverages. Nutrition shakes. Fruit juice. Sugar-sweetened soft drinks. Condiments Salad dressing. Mayonnaise. Alfredo sauce. Fruit preserves or jelly. Honey. Syrup. Sweets/Desserts Cake. Cookies. Pie. Pastries. Candy bars. Chocolate. Fats and Oils Butter or margarine. Oil. Gravy. Other Meal-replacement bars. The items listed above may not be a complete list of recommended foods or beverages. Contact your dietitian for more options. What are some tips for including high-protein and high-calorie foods in my diet?  Add whole milk, half-and-half, or heavy cream to cereal, pudding, soup, or hot cocoa.  Add whole milk to instant breakfast drinks.  Add peanut butter to oatmeal or smoothies.  Add powdered milk to baked goods, smoothies, or milkshakes.  Add powdered milk, cream, or butter to mashed potatoes.  Add cheese to cooked vegetables.  Make whole-milk yogurt parfaits. Top them with granola, fruit, or nuts.  Add cottage cheese to your fruit.  Add avocados, cheese, or both to sandwiches or salads.  Add meat, poultry, or seafood to rice, pasta, casseroles, salads, and soups.  Use mayonnaise when making egg salad, chicken salad, or tuna salad.  Use peanut butter as a topping for pretzels, celery, or crackers.  Add beans to casseroles, dips, and spreads.  Add pureed beans to sauces and soups.  Replace calorie-free drinks with calorie-containing drinks, such as milk and fruit juice. This information is not intended to replace advice given to you by your health care provider. Make sure you discuss any questions you have with your   health care provider. Document Released: 02/07/2005 Document Revised: 07/16/2015 Document Reviewed:  07/23/2013 Elsevier Interactive Patient Education  2018 Elsevier Inc.  

## 2017-06-22 NOTE — Progress Notes (Signed)
Subjective:    Patient ID: Robin [name], female    DOB: 1940/04/05, 77 y.o.   MRN: [id number]   Chief Complaint: Medical Management of Chronic Issues   HPI:  1. Essential hypertension  No c/o chest , sob or headaches. Does not check blood pressure at home. BP Readings from Last 3 Encounters:  06/22/17 (!) 98/56  12/23/16 120/76  06/09/16 106/62     2. Age related osteoporosis, unspecified pathological fracture presence  No c/o back pain. Last dexascan wsa 12/06/16 with t score of -3.5. She is doing prolia injections without problems.  3. Vitamin D insufficiency  Takes caltrate daily  4. Underweight  weight has gone down 6lbs since last visit. She says she eats and drinks ensure daily  5. Pure hypercholesterolemia  Doesn't really watch what she eats.    Outpatient Encounter Medications as of 06/22/2017  Medication Sig  . aspirin EC 81 MG tablet Take 81 mg by mouth daily.  Marland Kitchen atorvastatin (LIPITOR) 80 MG tablet Take 1 tablet (80 mg total) by mouth daily.  . Calcium-Vitamin D (CALTRATE 600 PLUS-VIT D PO) Take 1 tablet by mouth daily.  . Cholecalciferol (VITAMIN D) 2000 UNITS tablet Take 4000IU on saturdays and sundays and 2000IU all other days.  Marland Kitchen denosumab (PROLIA) 60 MG/ML SOLN injection Bring to office for administration. Administer in upper arm, thigh, or abdomen  . ENSURE PLUS (ENSURE PLUS) LIQD Take 1 Can by mouth daily. Dx:  R63.6 (underweight)  . lisinopril (PRINIVIL,ZESTRIL) 10 MG tablet Take 1 tablet (10 mg total) by mouth daily.    New complaints: None today  Social history: Lives alone. Has family near by. Does not have medical alert system  Review of Systems  Constitutional: Negative for activity change and appetite change.  HENT: Negative.   Eyes: Negative for pain.  Respiratory: Negative for shortness of breath.   Cardiovascular: Negative for chest pain, palpitations and leg swelling.  Gastrointestinal: Negative for abdominal pain.  Endocrine:  Negative for polydipsia.  Genitourinary: Negative.   Skin: Negative for rash.  Neurological: Negative for dizziness, weakness and headaches.  Hematological: Does not bruise/bleed easily.  Psychiatric/Behavioral: Negative.   All other systems reviewed and are negative.      Objective:   Physical Exam  Constitutional: She is oriented to person, place, and time.  HENT:  Head: Normocephalic.  Nose: Nose normal.  Mouth/Throat: Oropharynx is clear and moist.  Eyes: Pupils are equal, round, and reactive to light. EOM are normal.  Neck: Normal range of motion. Neck supple. No JVD present. Carotid bruit is not present.  Cardiovascular: Normal rate, regular rhythm, normal heart sounds and intact distal pulses.  Pulmonary/Chest: Effort normal and breath sounds normal. No respiratory distress. She has no wheezes. She has no rales. She exhibits no tenderness.  Abdominal: Soft. Normal appearance, normal aorta and bowel sounds are normal. She exhibits no distension, no abdominal bruit, no pulsatile midline mass and no mass. There is no splenomegaly or hepatomegaly. There is no tenderness.  Musculoskeletal: Normal range of motion. She exhibits no edema.  Lymphadenopathy:    She has no cervical adenopathy.  Neurological: She is alert and oriented to person, place, and time. She has normal reflexes.  Skin: Skin is warm and dry.  Psychiatric: Judgment normal.   BP (!) 98/56   Pulse (!) 55   Temp (!) 97.1 F (36.2 C) (Oral)   Ht 5\' 1"  (1.549 m)   Wt 101 lb (45.8 kg)   BMI 19.08  kg/m         Assessment & Plan:  VIRGIL SLINGER comes in today with chief complaint of Medical Management of Chronic Issues   Diagnosis and orders addressed:  1. Essential hypertension Low sodium diet - lisinopril (PRINIVIL,ZESTRIL) 10 MG tablet; Take 1 tablet (10 mg total) by mouth daily.  Dispense: 90 tablet; Refill: 1  2. Age related osteoporosis, unspecified pathological fracture presence Weight bearing  exercises encouraged  3. Vitamin D insufficiency Continue vitamind OTC  4. Underweight Continue ensure daily - ENSURE PLUS (ENSURE PLUS) LIQD; Take 1 Can by mouth daily. Dx:  R63.6 (underweight)  Dispense: 7110 mL; Refill: 2  5. Pure hypercholesterolemia Low fat diet - atorvastatin (LIPITOR) 80 MG tablet; Take 1 tablet (80 mg total) by mouth daily.  Dispense: 90 tablet; Refill: 1   Labs pending Health Maintenance reviewed Diet and exercise encouraged  Follow up plan: 6 months   [name] Done, FNP

## 2017-06-23 LAB — CMP14+EGFR
ALT: 8 [IU]/L (ref 0–32)
AST: 15 [IU]/L (ref 0–40)
Albumin/Globulin Ratio: 1.5 (ref 1.2–2.2)
Albumin: 4.1 g/dL (ref 3.5–4.8)
Alkaline Phosphatase: 64 [IU]/L (ref 39–117)
BUN/Creatinine Ratio: 8 — ABNORMAL LOW (ref 12–28)
BUN: 8 mg/dL (ref 8–27)
Bilirubin Total: 0.5 mg/dL (ref 0.0–1.2)
CO2: 27 mmol/L (ref 20–29)
Calcium: 9.6 mg/dL (ref 8.7–10.3)
Chloride: 104 mmol/L (ref 96–106)
Creatinine, Ser: 0.97 mg/dL (ref 0.57–1.00)
GFR calc Af Amer: 66 mL/min/{1.73_m2}
GFR calc non Af Amer: 57 mL/min/{1.73_m2} — ABNORMAL LOW
Globulin, Total: 2.7 g/dL (ref 1.5–4.5)
Glucose: 79 mg/dL (ref 65–99)
Potassium: 4.4 mmol/L (ref 3.5–5.2)
Sodium: 143 mmol/L (ref 134–144)
Total Protein: 6.8 g/dL (ref 6.0–8.5)

## 2017-06-23 LAB — THYROID PANEL WITH TSH
Free Thyroxine Index: 1.7 (ref 1.2–4.9)
T3 Uptake Ratio: 23 % — ABNORMAL LOW (ref 24–39)
T4, Total: 7.4 ug/dL (ref 4.5–12.0)
TSH: 3.08 u[IU]/mL (ref 0.450–4.500)

## 2017-06-23 LAB — LIPID PANEL
Chol/HDL Ratio: 3.5 ratio (ref 0.0–4.4)
Cholesterol, Total: 188 mg/dL (ref 100–199)
HDL: 53 mg/dL
LDL Calculated: 114 mg/dL — ABNORMAL HIGH (ref 0–99)
Triglycerides: 106 mg/dL (ref 0–149)
VLDL Cholesterol Cal: 21 mg/dL (ref 5–40)

## 2017-07-21 NOTE — Telephone Encounter (Signed)
Ready for scheduling

## 2017-07-21 NOTE — Telephone Encounter (Signed)
Summary of benefits received. Patient wouldn't owe anything out of pocket.

## 2017-08-10 ENCOUNTER — Telehealth: Payer: Self-pay | Admitting: Nurse Practitioner

## 2017-08-14 NOTE — Telephone Encounter (Signed)
Medication ordered and patient called to schedule but was unable to reach her.  Will attempt again later.

## 2017-08-14 NOTE — Telephone Encounter (Signed)
Tried to call patient but did not get an answer.We are out of stock right now but an order was placed today so she can schedule anytime after 08/16/17. Will attempt to call back later.

## 2017-08-18 ENCOUNTER — Telehealth: Payer: Self-pay | Admitting: Nurse Practitioner

## 2017-08-18 NOTE — Telephone Encounter (Signed)
We've tried to contact patient several times to schedule her injection. Medication doesn't come from [facility]. It ha to be ordered by our office and billed by our office. Spoke to patient today and appt scheduled for next week. See osteoporosis telephone encounter for more information.

## 2017-08-18 NOTE — Telephone Encounter (Signed)
Patient scheduled for 08/22/17. Prolia available. Patient is buy and bill.

## 2017-08-22 ENCOUNTER — Ambulatory Visit (INDEPENDENT_AMBULATORY_CARE_PROVIDER_SITE_OTHER): Payer: Medicare Other | Admitting: *Deleted

## 2017-08-22 DIAGNOSIS — M810 Age-related osteoporosis without current pathological fracture: Secondary | ICD-10-CM

## 2017-08-22 MED ORDER — DENOSUMAB 60 MG/ML ~~LOC~~ SOSY
60.0000 mg | PREFILLED_SYRINGE | Freq: Once | SUBCUTANEOUS | Status: AC
  Administered 2017-08-22: 60 mg via SUBCUTANEOUS

## 2017-08-22 NOTE — Progress Notes (Signed)
Pt given Prolia inj Tolerated well Buy and bill Next Dexa due in 11/2018

## 2017-10-19 ENCOUNTER — Other Ambulatory Visit: Payer: Self-pay | Admitting: Nurse Practitioner

## 2017-10-19 DIAGNOSIS — I10 Essential (primary) hypertension: Secondary | ICD-10-CM

## 2017-10-20 NOTE — Telephone Encounter (Signed)
Pt will check with [facility] on 06/22/17 #90 with a refill

## 2017-12-19 ENCOUNTER — Encounter: Payer: Self-pay | Admitting: Nurse Practitioner

## 2017-12-19 ENCOUNTER — Ambulatory Visit (INDEPENDENT_AMBULATORY_CARE_PROVIDER_SITE_OTHER): Payer: Medicare Other | Admitting: Nurse Practitioner

## 2017-12-19 VITALS — BP 139/80 | HR 67 | Temp 97.7°F | Ht 61.0 in | Wt 105.0 lb

## 2017-12-19 DIAGNOSIS — F411 Generalized anxiety disorder: Secondary | ICD-10-CM | POA: Diagnosis not present

## 2017-12-19 DIAGNOSIS — I10 Essential (primary) hypertension: Secondary | ICD-10-CM

## 2017-12-19 DIAGNOSIS — E559 Vitamin D deficiency, unspecified: Secondary | ICD-10-CM

## 2017-12-19 DIAGNOSIS — E7800 Pure hypercholesterolemia, unspecified: Secondary | ICD-10-CM | POA: Diagnosis not present

## 2017-12-19 DIAGNOSIS — M810 Age-related osteoporosis without current pathological fracture: Secondary | ICD-10-CM | POA: Diagnosis not present

## 2017-12-19 DIAGNOSIS — Z23 Encounter for immunization: Secondary | ICD-10-CM | POA: Diagnosis not present

## 2017-12-19 DIAGNOSIS — R636 Underweight: Secondary | ICD-10-CM | POA: Diagnosis not present

## 2017-12-19 MED ORDER — LISINOPRIL 10 MG PO TABS: 10.0000 mg | ORAL_TABLET | Freq: Every day | ORAL | 1 refills | Status: DC

## 2017-12-19 MED ORDER — ATORVASTATIN CALCIUM 80 MG PO TABS: 80.0000 mg | ORAL_TABLET | Freq: Every day | ORAL | 1 refills | Status: DC

## 2017-12-19 MED ORDER — ESCITALOPRAM OXALATE 10 MG PO TABS: 10.0000 mg | ORAL_TABLET | Freq: Every day | ORAL | 5 refills | Status: DC

## 2017-12-19 MED ORDER — ENSURE PLUS PO LIQD: 1.0000 | Freq: Every day | ORAL | 2 refills | Status: AC

## 2017-12-19 NOTE — Patient Instructions (Signed)
Osteoporosis Osteoporosis happens when your bones become thinner and weaker. Weak bones can break (fracture) more easily when you slip or fall. Bones most at risk of breaking are in the hip, wrist, and spine. Follow these instructions at home:  Get enough calcium and vitamin D. These nutrients are good for your bones.  Exercise as told by your doctor.  Do not use any tobacco products. This includes cigarettes, chewing tobacco, and electronic cigarettes. If you need help quitting, ask your doctor.  Limit the amount of alcohol you drink.  Take medicines only as told by your doctor.  Keep all follow-up visits as told by your doctor. This is important.  Take care at home to prevent falls. Some ways to do this are: ? Keep rooms well lit and tidy. ? Put safety rails on your stairs. ? Put a rubber mat in the bathroom and other places that are often wet or slippery. Get help right away if:  You fall.  You hurt yourself. This information is not intended to replace advice given to you by your health care provider. Make sure you discuss any questions you have with your health care provider. Document Released: 05/02/2011 Document Revised: 07/16/2015 Document Reviewed: 07/18/2013 Elsevier Interactive Patient Education  2018 Elsevier Inc.  

## 2017-12-19 NOTE — Progress Notes (Signed)
Subjective:    Patient ID: Robin [name], female    DOB: 10/07/1940, 77 y.o.   MRN: [id number]   Chief Complaint: Medical Management of Chronic Issues   HPI:  1. Essential hypertension  -does check BP at home -averages 130s/60s -no c/o CP/SOB/HA -does watch salt intake diet BP Readings from Last 3 Encounters:  12/19/17 139/80  06/22/17 (!) 98/56  12/23/16 120/76    2. Age related osteoporosis, unspecified pathological fracture presence  -does exercise-walking, but no weight-bearing -last DEXA was 12/06/2016 t-score was -3.5  3. Pure hypercholesterolemia  -tries to avoid fatty foods -no LE arthralgia  4. Vitamin D insufficiency  -no issues she is aware of  5. Underweight  -drinks Ensure 1/day -up 4 lbs from last visit in May 2019    Outpatient Encounter Medications as of 12/19/2017  Medication Sig  . aspirin EC 81 MG tablet Take 81 mg by mouth daily.  Marland Kitchen atorvastatin (LIPITOR) 80 MG tablet Take 1 tablet (80 mg total) by mouth daily.  . Calcium-Vitamin D (CALTRATE 600 PLUS-VIT D PO) Take 1 tablet by mouth daily.  . Cholecalciferol (VITAMIN D) 2000 UNITS tablet Take 4000IU on saturdays and sundays and 2000IU all other days.  Marland Kitchen denosumab (PROLIA) 60 MG/ML SOLN injection Bring to office for administration. Administer in upper arm, thigh, or abdomen  . ENSURE PLUS (ENSURE PLUS) LIQD Take 1 Can by mouth daily. Dx:  R63.6 (underweight)  . lisinopril (PRINIVIL,ZESTRIL) 10 MG tablet Take 1 tablet (10 mg total) by mouth daily.   No facility-administered encounter medications on file as of 12/19/2017.       New complaints: Patient says she has been under stress lately, "nerves got bad." Started about 2 weeks ago, sister has been sick for over 4 years and patient helps care for sister, including during a recent hospitalization, uncle has cancer and dementia (she helps with him too), oldest daughter has Huntington's Disease whose disease is progressing. Has never taken  medication before but feels like she needs something GAD 7 : Generalized Anxiety Score 12/19/2017  Nervous, Anxious, on Edge 3  Control/stop worrying 2  Worry too much - different things 0  Trouble relaxing 0  Restless 0  Easily annoyed or irritable 2  Afraid - awful might happen 0  Total GAD 7 Score 7  Anxiety Difficulty Not difficult at all    Social history:  Widowed, lives by herself, daughter with Huntington's disease moved across the hall in same apartment complex, widowed   Review of Systems  Constitutional: Negative for activity change, appetite change, chills, fatigue, fever and unexpected weight change.  HENT: Negative for congestion, ear pain, rhinorrhea, sinus pressure, sinus pain and sore throat.   Eyes: Negative for pain, redness and visual disturbance.  Respiratory: Negative for cough, chest tightness, shortness of breath and wheezing.   Cardiovascular: Negative for chest pain, palpitations and leg swelling.  Gastrointestinal: Negative for abdominal pain, constipation, diarrhea, nausea and vomiting.  Endocrine: Negative for cold intolerance, heat intolerance, polydipsia, polyphagia and polyuria.  Genitourinary: Negative for difficulty urinating, dysuria and urgency.  Musculoskeletal: Negative for arthralgias, gait problem, joint swelling and myalgias.  Skin: Negative for rash and wound.  Allergic/Immunologic: Negative for environmental allergies and food allergies.  Neurological: Negative for dizziness, tremors, weakness and numbness.  Hematological: Does not bruise/bleed easily.  Psychiatric/Behavioral: Negative for behavioral problems, confusion, decreased concentration, sleep disturbance and suicidal ideas. The patient is not nervous/anxious.        Objective:  Physical Exam  Constitutional: She is oriented to person, place, and time. She appears well-developed and well-nourished.  HENT:  Head: Normocephalic and atraumatic.  Right Ear: External ear normal.    Left Ear: External ear normal.  Nose: Nose normal.  Mouth/Throat: Oropharynx is clear and moist. She has dentures. No oropharyngeal exudate.  Eyes: Pupils are equal, round, and reactive to light. Conjunctivae and EOM are normal.  Neck: Normal range of motion. Neck supple. No thyromegaly present.  Cardiovascular: Normal rate, regular rhythm, normal heart sounds and intact distal pulses.  Pulmonary/Chest: Effort normal. She has decreased breath sounds in the right lower field and the left lower field.  Abdominal: Soft. Bowel sounds are normal.  Musculoskeletal: Normal range of motion.  Neurological: She is alert and oriented to person, place, and time. She displays normal reflexes. No cranial nerve deficit.  Skin: Skin is warm and dry.  Psychiatric: She has a normal mood and affect. Her behavior is normal. Judgment and thought content normal.  Nursing note and vitals reviewed.   BP 139/80   Pulse 67   Temp 97.7 F (36.5 C) (Oral)   Ht _0  (1.549 m)   Wt 105 lb (47.6 kg)   BMI 19.84 kg/m        Assessment & Plan:  [name] comes in today with chief complaint of Medical Management of Chronic Issues   Diagnosis and orders addressed:  1. Essential hypertension -low salt diet - CMP14+EGFR  2. Age related osteoporosis, unspecified pathological fracture presence -weight bearing exercise as tolerated  3. Pure hypercholesterolemia -avoid fried/fatty foods - Lipid panel  4. Vitamin D insufficiency -continue supplements  5. Underweight -continue Ensure daily  6. GAD (generalized anxiety disorder) -stress management - escitalopram (LEXAPRO) 10 MG tablet; Take 1 tablet (10 mg total) by mouth daily.  Dispense: 30 tablet; Refill: 5   Labs pending Health Maintenance reviewed Diet and exercise encouraged  Follow up plan:    6 months   [name] Done, FNP

## 2017-12-20 DIAGNOSIS — Z23 Encounter for immunization: Secondary | ICD-10-CM | POA: Diagnosis not present

## 2017-12-20 LAB — CMP14+EGFR
ALK PHOS: 55 IU/L (ref 39–117)
ALT: 8 IU/L (ref 0–32)
AST: 16 IU/L (ref 0–40)
Albumin/Globulin Ratio: 1.8 (ref 1.2–2.2)
Albumin: 4.2 g/dL (ref 3.5–4.8)
BUN/Creatinine Ratio: 9 — ABNORMAL LOW (ref 12–28)
BUN: 10 mg/dL (ref 8–27)
Bilirubin Total: 0.4 mg/dL (ref 0.0–1.2)
CHLORIDE: 103 mmol/L (ref 96–106)
CO2: 23 mmol/L (ref 20–29)
CREATININE: 1.06 mg/dL — AB (ref 0.57–1.00)
Calcium: 9.6 mg/dL (ref 8.7–10.3)
GFR calc Af Amer: 59 mL/min/{1.73_m2} — ABNORMAL LOW (ref >59)
GFR calc non Af Amer: 51 mL/min/{1.73_m2} — ABNORMAL LOW (ref >59)
GLUCOSE: 84 mg/dL (ref 65–99)
Globulin, Total: 2.4 g/dL (ref 1.5–4.5)
Potassium: 3.9 mmol/L (ref 3.5–5.2)
Sodium: 140 mmol/L (ref 134–144)
TOTAL PROTEIN: 6.6 g/dL (ref 6.0–8.5)

## 2017-12-20 LAB — LIPID PANEL
CHOLESTEROL TOTAL: 161 mg/dL (ref 100–199)
Chol/HDL Ratio: 3.2 ratio (ref 0.0–4.4)
HDL: 50 mg/dL (ref >39)
LDL CALC: 87 mg/dL (ref 0–99)
Triglycerides: 120 mg/dL (ref 0–149)
VLDL CHOLESTEROL CAL: 24 mg/dL (ref 5–40)

## 2018-02-16 DIAGNOSIS — Z1231 Encounter for screening mammogram for malignant neoplasm of breast: Secondary | ICD-10-CM | POA: Diagnosis not present

## 2018-02-16 LAB — HM MAMMOGRAPHY

## 2018-02-26 ENCOUNTER — Other Ambulatory Visit: Payer: Self-pay | Admitting: Nurse Practitioner

## 2018-02-28 ENCOUNTER — Ambulatory Visit (INDEPENDENT_AMBULATORY_CARE_PROVIDER_SITE_OTHER): Payer: Medicare Other | Admitting: *Deleted

## 2018-02-28 DIAGNOSIS — M810 Age-related osteoporosis without current pathological fracture: Secondary | ICD-10-CM | POA: Diagnosis not present

## 2018-02-28 MED ORDER — DENOSUMAB 60 MG/ML ~~LOC~~ SOSY
60.0000 mg | PREFILLED_SYRINGE | Freq: Once | SUBCUTANEOUS | Status: AC
  Administered 2018-02-28: 60 mg via SUBCUTANEOUS

## 2018-02-28 NOTE — Progress Notes (Signed)
Prolia inj given  Pt supplied Next due 08/30/2018 dexa due 11/2018

## 2018-04-24 ENCOUNTER — Other Ambulatory Visit: Payer: Self-pay | Admitting: Nurse Practitioner

## 2018-04-24 DIAGNOSIS — I10 Essential (primary) hypertension: Secondary | ICD-10-CM

## 2018-06-21 ENCOUNTER — Other Ambulatory Visit: Payer: Self-pay

## 2018-06-21 ENCOUNTER — Ambulatory Visit (INDEPENDENT_AMBULATORY_CARE_PROVIDER_SITE_OTHER): Payer: Medicare Other | Admitting: Nurse Practitioner

## 2018-06-21 ENCOUNTER — Encounter: Payer: Self-pay | Admitting: Nurse Practitioner

## 2018-06-21 DIAGNOSIS — M810 Age-related osteoporosis without current pathological fracture: Secondary | ICD-10-CM | POA: Diagnosis not present

## 2018-06-21 DIAGNOSIS — F411 Generalized anxiety disorder: Secondary | ICD-10-CM | POA: Insufficient documentation

## 2018-06-21 DIAGNOSIS — I10 Essential (primary) hypertension: Secondary | ICD-10-CM | POA: Diagnosis not present

## 2018-06-21 DIAGNOSIS — E559 Vitamin D deficiency, unspecified: Secondary | ICD-10-CM | POA: Diagnosis not present

## 2018-06-21 DIAGNOSIS — E7800 Pure hypercholesterolemia, unspecified: Secondary | ICD-10-CM | POA: Diagnosis not present

## 2018-06-21 DIAGNOSIS — R636 Underweight: Secondary | ICD-10-CM

## 2018-06-21 MED ORDER — LISINOPRIL 10 MG PO TABS: 10.0000 mg | ORAL_TABLET | Freq: Every day | ORAL | 1 refills | Status: DC

## 2018-06-21 MED ORDER — ESCITALOPRAM OXALATE 10 MG PO TABS: 10.0000 mg | ORAL_TABLET | Freq: Every day | ORAL | 1 refills | Status: DC

## 2018-06-21 MED ORDER — ATORVASTATIN CALCIUM 80 MG PO TABS: 80.0000 mg | ORAL_TABLET | Freq: Every day | ORAL | 1 refills | Status: DC

## 2018-06-21 NOTE — Progress Notes (Signed)
Patient ID: Robin [name], female   DOB: Sep 21, 1940, 78 y.o.   MRN: [id number]    Virtual Visit via telephone Note  I connected with [name] on 06/21/18 at 1:50 PM by telephone and verified that I am speaking with the correct person using two identifiers. Synetta Shadow KHALA TARTE is currently located at home and her son is currently with her during visit. The provider, [name] Done, FNP is located in their office at time of visit.  I discussed the limitations, risks, security and privacy concerns of performing an evaluation and management service by telephone and the availability of in person appointments. I also discussed with the patient that there may be a patient responsible charge related to this service. The patient expressed understanding and agreed to proceed.   History and Present Illness:   Chief Complaint: medical management of chronic issues   HPI:  1. Essential hypertension No c/o chest pain, sob or headache. Does not check blood pressure at home. BP Readings from Last 3 Encounters:  12/19/17 139/80  06/22/17 (!) 98/56  12/23/16 120/76     2. Pure hypercholesterolemia Does not watch diet and does no exercise  3. Age related osteoporosis, unspecified pathological fracture presence Last dexascan was done 12/06/16. Her t score was -3.5. is currently on prolia without side effects.  4. Vitamin D insufficiency Takes a dily vitamin d supplement  5. Underweight No weight changes  6. GAD Is on lexapo and is working well for her. No side effects   Outpatient Encounter Medications as of 06/21/2018  Medication Sig  . aspirin EC 81 MG tablet Take 81 mg by mouth daily.  Marland Kitchen atorvastatin (LIPITOR) 80 MG tablet Take 1 tablet (80 mg total) by mouth daily.  . Calcium-Vitamin D (CALTRATE 600 PLUS-VIT D PO) Take 1 tablet by mouth daily.  . Cholecalciferol (VITAMIN D) 2000 UNITS tablet Take 4000IU on saturdays and sundays and 2000IU all other days.  . ENSURE PLUS (ENSURE  PLUS) LIQD Take 1 Can by mouth daily. Dx:  R63.6 (underweight)  . escitalopram (LEXAPRO) 10 MG tablet Take 1 tablet (10 mg total) by mouth daily.  Marland Kitchen lisinopril (PRINIVIL,ZESTRIL) 10 MG tablet Take 1 tablet by mouth once daily  . PROLIA 60 MG/ML SOSY injection BRING TO OFFICE FOR ADMINISTRATION. ADMINISTER IN UPPER ARM, THIGH OR ABDOMEN       New complaints: None today  Social history: Her son is living with her right now       Review of Systems  Constitutional: Negative for diaphoresis and weight loss.  Eyes: Negative for blurred vision, double vision and pain.  Respiratory: Negative for shortness of breath.   Cardiovascular: Negative for chest pain, palpitations, orthopnea and leg swelling.  Gastrointestinal: Negative for abdominal pain.  Skin: Negative for rash.  Neurological: Negative for dizziness, sensory change, loss of consciousness, weakness and headaches.  Endo/Heme/Allergies: Negative for polydipsia. Does not bruise/bleed easily.  Psychiatric/Behavioral: Negative for memory loss. The patient does not have insomnia.   All other systems reviewed and are negative.    Observations/Objective: Alert and oriented- answers all questions appropriately No distress  Assessment and Plan: IVEY NEMBHARD comes in today with chief complaint of No chief complaint on file.   Diagnosis and orders addressed:  1. Essential hypertension Low sodium diet - lisinopril (ZESTRIL) 10 MG tablet; Take 1 tablet (10 mg total) by mouth daily.  Dispense: 90 tablet; Refill: 1  2. Pure hypercholesterolemia Low fat diet - atorvastatin (LIPITOR) 80 MG tablet;  Take 1 tablet (80 mg total) by mouth daily.  Dispense: 90 tablet; Refill: 1  3. Age related osteoporosis, unspecified pathological fracture presence Weight bearing exercises as encouraged Make sure gets prolia shots on schedule  4. Vitamin D insufficiency continue daily vitamin d supplement  5. Underweight Eat 3 meals a day   6. GAD (generalized anxiety disorder) stres ,management - escitalopram (LEXAPRO) 10 MG tablet; Take 1 tablet (10 mg total) by mouth daily.  Dispense: 90 tablet; Refill: 1   Previous lab results reviewed Health Maintenance reviewed Diet and exercise encouraged  Follow up plan: 3 months     I discussed the assessment and treatment plan with the patient. The patient was provided an opportunity to ask questions and all were answered. The patient agreed with the plan and demonstrated an understanding of the instructions.   The patient was advised to call back or seek an in-person evaluation if the symptoms worsen or if the condition fails to improve as anticipated.  The above assessment and management plan was discussed with the patient. The patient verbalized understanding of and has agreed to the management plan. Patient is aware to call the clinic if symptoms persist or worsen. Patient is aware when to return to the clinic for a follow-up visit. Patient educated on when it is appropriate to go to the emergency department.    I provided 15 minutes of non-face-to-face time during this encounter.    [name] Done, FNP

## 2018-11-20 ENCOUNTER — Telehealth: Payer: Self-pay | Admitting: Nurse Practitioner

## 2018-11-20 DIAGNOSIS — Z1211 Encounter for screening for malignant neoplasm of colon: Secondary | ICD-10-CM

## 2018-11-20 NOTE — Telephone Encounter (Signed)
Referral placed.

## 2018-11-26 ENCOUNTER — Other Ambulatory Visit: Payer: Self-pay | Admitting: Nurse Practitioner

## 2018-11-28 ENCOUNTER — Other Ambulatory Visit: Payer: Self-pay | Admitting: Nurse Practitioner

## 2018-11-28 DIAGNOSIS — Z1211 Encounter for screening for malignant neoplasm of colon: Secondary | ICD-10-CM

## 2018-11-28 NOTE — Progress Notes (Signed)
Ef gAI

## 2019-01-08 ENCOUNTER — Telehealth: Payer: Self-pay | Admitting: Nurse Practitioner

## 2019-01-08 NOTE — Telephone Encounter (Signed)
Patient is due for Prolia injection but she cannot have this until she has a dexa scan.  It has been two years since her last one.  She does not want to wait until we start doing dexa's here again.  She would like to be referred out for this to [facility]  Also, she thought she was due for a colonoscopy, but she will not need another one until May of 2021.  Can you do a referral for the dexa?

## 2019-01-10 NOTE — Telephone Encounter (Signed)
Schedule dexascan at [facility]

## 2019-01-16 ENCOUNTER — Telehealth: Payer: Self-pay | Admitting: Nurse Practitioner

## 2019-01-16 ENCOUNTER — Other Ambulatory Visit: Payer: Self-pay

## 2019-01-16 DIAGNOSIS — Z780 Asymptomatic menopausal state: Secondary | ICD-10-CM

## 2019-01-16 NOTE — Telephone Encounter (Signed)
Sent order for bone density scan to [facility], they will contact patient to make appointment.   Called and notified the patient.

## 2019-01-24 ENCOUNTER — Other Ambulatory Visit: Payer: Self-pay | Admitting: Nurse Practitioner

## 2019-01-26 ENCOUNTER — Other Ambulatory Visit: Payer: Self-pay | Admitting: Nurse Practitioner

## 2019-01-29 ENCOUNTER — Other Ambulatory Visit: Payer: Self-pay | Admitting: Nurse Practitioner

## 2019-02-04 ENCOUNTER — Ambulatory Visit (HOSPITAL_COMMUNITY)
Admission: RE | Admit: 2019-02-04 | Discharge: 2019-02-04 | Disposition: A | Discharge status: Home / Self Care | Payer: Medicare Other | Source: Ambulatory Visit | Attending: Nurse Practitioner | Admitting: Nurse Practitioner

## 2019-02-04 ENCOUNTER — Other Ambulatory Visit: Payer: Self-pay

## 2019-02-04 DIAGNOSIS — Z780 Asymptomatic menopausal state: Secondary | ICD-10-CM | POA: Insufficient documentation

## 2019-02-18 ENCOUNTER — Telehealth: Payer: Self-pay | Admitting: Nurse Practitioner

## 2019-02-18 NOTE — Telephone Encounter (Signed)
Pt called needing to speak with nurse to schedule hospital follow up visit with [facility] asap.

## 2019-02-18 NOTE — Telephone Encounter (Signed)
Did not need a hospital follow up.  She is having knee pain and may need to go to an orthopedic.  Appointment scheduled with pcp for tomorrow.

## 2019-02-19 ENCOUNTER — Encounter: Payer: Self-pay | Admitting: Nurse Practitioner

## 2019-02-19 ENCOUNTER — Ambulatory Visit (INDEPENDENT_AMBULATORY_CARE_PROVIDER_SITE_OTHER): Payer: Medicare Other | Admitting: Nurse Practitioner

## 2019-02-19 ENCOUNTER — Other Ambulatory Visit: Payer: Self-pay

## 2019-02-19 VITALS — BP 126/73 | HR 64 | Temp 98.4°F | Resp 20 | Ht 61.0 in | Wt 95.0 lb

## 2019-02-19 DIAGNOSIS — M25562 Pain in left knee: Secondary | ICD-10-CM | POA: Diagnosis not present

## 2019-02-19 DIAGNOSIS — M25561 Pain in right knee: Secondary | ICD-10-CM

## 2019-02-19 DIAGNOSIS — M25511 Pain in right shoulder: Secondary | ICD-10-CM

## 2019-02-19 DIAGNOSIS — T07XXXA Unspecified multiple injuries, initial encounter: Secondary | ICD-10-CM | POA: Diagnosis not present

## 2019-02-19 MED ORDER — KETOROLAC TROMETHAMINE 60 MG/2ML IM SOLN
60.0000 mg | Freq: Once | INTRAMUSCULAR | Status: AC
  Administered 2019-02-19: 60 mg via INTRAMUSCULAR

## 2019-02-19 NOTE — Patient Instructions (Signed)
Contusion A contusion is a deep bruise. Contusions are the result of a blunt injury to tissues and muscle fibers under the skin. The injury causes bleeding under the skin. The skin overlying the contusion may turn blue, purple, or yellow. Minor injuries will give you a painless contusion, but more severe injuries cause contusions that may stay painful and swollen for a few weeks. Follow these instructions at home: Pay attention to any changes in your symptoms. Let your health care provider know about them. Take these actions to relieve your pain. Managing pain, stiffness, and swelling   Use resting, icing, applying pressure (compression), and raising (elevating) the injured area. This is often called the RICE strategy. ? Rest the injured area. Return to your normal activities as told by your health care provider. Ask your health care provider what activities are safe for you. ? If directed, put ice on the injured area:  Put ice in a plastic bag.  Place a towel between your skin and the bag.  Leave the ice on for 20 minutes, 2-3 times per day. ? If directed, apply light compression to the injured area using an elastic bandage. Make sure the bandage is not wrapped too tightly. Remove and reapply the bandage as directed by your health care provider. ? If possible, raise (elevate) the injured area above the level of your heart while you are sitting or lying down. General instructions  Take over-the-counter and prescription medicines only as told by your health care provider.  Keep all follow-up visits as told by your health care provider. This is important. Contact a health care provider if:  Your symptoms do not improve after several days of treatment.  Your symptoms get worse.  You have difficulty moving the injured area. Get help right away if:  You have severe pain.  You have numbness in a hand or foot.  Your hand or foot turns pale or cold. Summary  A contusion is a deep  bruise.  Contusions are the result of a blunt injury to tissues and muscle fibers under the skin.  It is treated with rest, ice, compression, and elevation. You may be given over-the-counter medicines for pain.  Contact a health care provider if your symptoms do not improve, or get worse.  Get help right away if you have severe pain, have numbness, or the area turns pale or cold. This information is not intended to replace advice given to you by your health care provider. Make sure you discuss any questions you have with your health care provider. Document Released: 11/17/2004 Document Revised: 09/28/2017 Document Reviewed: 09/28/2017 Elsevier Patient Education  2020 [employer].
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
## 2019-02-27 ENCOUNTER — Other Ambulatory Visit: Payer: Self-pay | Admitting: Nurse Practitioner

## 2019-04-22 ENCOUNTER — Telehealth: Payer: Self-pay | Admitting: Nurse Practitioner
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
## 2019-04-23 ENCOUNTER — Other Ambulatory Visit: Payer: Self-pay

## 2019-04-24 ENCOUNTER — Other Ambulatory Visit: Payer: Self-pay

## 2019-04-24 ENCOUNTER — Ambulatory Visit (INDEPENDENT_AMBULATORY_CARE_PROVIDER_SITE_OTHER): Payer: Medicare Other | Admitting: Family Medicine

## 2019-04-24 ENCOUNTER — Encounter: Payer: Self-pay | Admitting: Family Medicine

## 2019-04-24 ENCOUNTER — Ambulatory Visit (INDEPENDENT_AMBULATORY_CARE_PROVIDER_SITE_OTHER): Payer: Medicare Other

## 2019-04-24 VITALS — BP 116/83 | HR 79 | Temp 98.6°F | Ht 61.0 in | Wt 96.2 lb

## 2019-04-24 DIAGNOSIS — M79605 Pain in left leg: Secondary | ICD-10-CM

## 2019-04-24 DIAGNOSIS — S8991XA Unspecified injury of right lower leg, initial encounter: Secondary | ICD-10-CM | POA: Diagnosis not present

## 2019-04-24 DIAGNOSIS — M79604 Pain in right leg: Secondary | ICD-10-CM | POA: Diagnosis not present

## 2019-04-24 DIAGNOSIS — F411 Generalized anxiety disorder: Secondary | ICD-10-CM

## 2019-04-24 DIAGNOSIS — E7800 Pure hypercholesterolemia, unspecified: Secondary | ICD-10-CM | POA: Diagnosis not present

## 2019-04-24 DIAGNOSIS — M25562 Pain in left knee: Secondary | ICD-10-CM | POA: Diagnosis not present

## 2019-04-24 DIAGNOSIS — I10 Essential (primary) hypertension: Secondary | ICD-10-CM | POA: Diagnosis not present

## 2019-04-24 DIAGNOSIS — S8992XA Unspecified injury of left lower leg, initial encounter: Secondary | ICD-10-CM | POA: Diagnosis not present

## 2019-04-24 DIAGNOSIS — S99922A Unspecified injury of left foot, initial encounter: Secondary | ICD-10-CM | POA: Diagnosis not present

## 2019-04-24 DIAGNOSIS — M25561 Pain in right knee: Secondary | ICD-10-CM | POA: Diagnosis not present

## 2019-04-24 DIAGNOSIS — M79672 Pain in left foot: Secondary | ICD-10-CM | POA: Diagnosis not present

## 2019-04-24 DIAGNOSIS — M79661 Pain in right lower leg: Secondary | ICD-10-CM | POA: Diagnosis not present

## 2019-04-24 DIAGNOSIS — M79671 Pain in right foot: Secondary | ICD-10-CM | POA: Diagnosis not present

## 2019-04-24 DIAGNOSIS — M79662 Pain in left lower leg: Secondary | ICD-10-CM | POA: Diagnosis not present

## 2019-04-24 MED ORDER — ESCITALOPRAM OXALATE 10 MG PO TABS: 10.0000 mg | ORAL_TABLET | Freq: Every day | ORAL | 1 refills | Status: DC

## 2019-04-24 MED ORDER — PROLIA 60 MG/ML ~~LOC~~ SOSY: PREFILLED_SYRINGE | SUBCUTANEOUS | 0 refills | Status: DC

## 2019-04-24 MED ORDER — ATORVASTATIN CALCIUM 80 MG PO TABS: 80.0000 mg | ORAL_TABLET | Freq: Every day | ORAL | 1 refills | Status: DC

## 2019-04-24 MED ORDER — LISINOPRIL 10 MG PO TABS: 10.0000 mg | ORAL_TABLET | Freq: Every day | ORAL | 1 refills | Status: DC
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
## 2019-04-25 DIAGNOSIS — H905 Unspecified sensorineural hearing loss: Secondary | ICD-10-CM | POA: Diagnosis not present

## 2019-05-06 ENCOUNTER — Encounter: Payer: Self-pay | Admitting: Internal Medicine

## 2019-05-07 ENCOUNTER — Other Ambulatory Visit: Payer: Self-pay

## 2019-05-07 ENCOUNTER — Ambulatory Visit: Payer: Medicare Other

## 2019-05-16 ENCOUNTER — Other Ambulatory Visit: Payer: Self-pay

## 2019-05-16 ENCOUNTER — Ambulatory Visit (INDEPENDENT_AMBULATORY_CARE_PROVIDER_SITE_OTHER): Payer: Medicare Other | Admitting: Nurse Practitioner

## 2019-05-16 ENCOUNTER — Encounter: Payer: Self-pay | Admitting: Nurse Practitioner

## 2019-05-16 VITALS — BP 133/82 | HR 74 | Temp 97.5°F | Resp 20 | Ht 61.0 in | Wt 95.0 lb

## 2019-05-16 DIAGNOSIS — E7800 Pure hypercholesterolemia, unspecified: Secondary | ICD-10-CM | POA: Diagnosis not present

## 2019-05-16 DIAGNOSIS — F411 Generalized anxiety disorder: Secondary | ICD-10-CM

## 2019-05-16 DIAGNOSIS — I10 Essential (primary) hypertension: Secondary | ICD-10-CM

## 2019-05-16 DIAGNOSIS — R636 Underweight: Secondary | ICD-10-CM

## 2019-05-16 DIAGNOSIS — M810 Age-related osteoporosis without current pathological fracture: Secondary | ICD-10-CM

## 2019-05-16 DIAGNOSIS — E559 Vitamin D deficiency, unspecified: Secondary | ICD-10-CM

## 2019-05-16 MED ORDER — DENOSUMAB 60 MG/ML ~~LOC~~ SOSY
60.0000 mg | PREFILLED_SYRINGE | Freq: Once | SUBCUTANEOUS | Status: AC
  Administered 2019-05-16: 60 mg via SUBCUTANEOUS

## 2019-05-16 NOTE — Progress Notes (Signed)
Subjective:    Patient ID: Robin [name], female    DOB: 30-Apr-1940, 79 y.o.   MRN: [id number]   Chief Complaint: Medical Management of Chronic Issues    HPI:  1. Essential hypertension No c/o chest pain, son or headache. Does  check blood pressure at [place]. 322'G systolic. BP Readings from Last 3 Encounters:  05/16/19 133/82  04/24/19 116/83  02/19/19 126/73     2. Pure hypercholesterolemia She does watch diet. She walks daily for exercise  3. Underweight Her weight has been fairly stable. She says she has a good appetite and eats a lot. Wt Readings from Last 3 Encounters:  05/16/19 95 lb (43.1 kg)  04/24/19 96 lb 3.2 oz (43.6 kg)  02/19/19 95 lb (43.1 kg)     4. Age related osteoporosis, unspecified pathological fracture presence Dos weight bearing exercise daily. Is on prolia and will get shot today.  5. GAD (generalized anxiety disorder) She is on lexapro daily and s doing well.  GAD 7 : Generalized Anxiety Score 05/16/2019 12/19/2017  Nervous, Anxious, on Edge 0 3  Control/stop worrying 0 2  Worry too much - different things 0 0  Trouble relaxing 0 0  Restless 0 0  Easily annoyed or irritable 0 2  Afraid - awful might happen 0 0  Total GAD 7 Score 0 7  Anxiety Difficulty Not difficult at all Not difficult at all      6. Vitamin D insufficiency Takes a daily vitamin d supplement    Outpatient Encounter Medications as of 05/16/2019  Medication Sig  . aspirin EC 81 MG tablet Take 81 mg by mouth daily.  Marland Kitchen atorvastatin (LIPITOR) 80 MG tablet Take 1 tablet (80 mg total) by mouth daily.  . Calcium-Vitamin D (CALTRATE 600 PLUS-VIT D PO) Take 1 tablet by mouth daily.  . Cholecalciferol (VITAMIN D) 2000 UNITS tablet Take 4000IU on saturdays and sundays and 2000IU all other days.  Marland Kitchen denosumab (PROLIA) 60 MG/ML SOSY injection BRING TO OFFICE FOR ADMINISTRATION. ADMINISTER IN UPPER ARM, THIGH OR ABDOMEN  . ENSURE PLUS (ENSURE PLUS) LIQD Take 1 Can by mouth  daily. Dx:  R63.6 (underweight)  . escitalopram (LEXAPRO) 10 MG tablet Take 1 tablet (10 mg total) by mouth daily.  Marland Kitchen lisinopril (ZESTRIL) 10 MG tablet Take 1 tablet (10 mg total) by mouth daily.     Past Surgical History:  Procedure Laterality Date  . 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 COLONOSCOPY  April 2007   Dr. Sharlett Iles: 5cm circumferential fungating tumor at the hepatic flexure, path tubulovillous adenoma.   . COLONOSCOPY  Jan 02, 2006   Dr. 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 Multiple polyps around anastomosis of different sizes,   . COLONOSCOPY  2008   Dr. Sharlett Iles. Referred to Dr. Georgette Dover for resection  . COLONOSCOPY N/A 07/18/2014   Procedure: COLONOSCOPY;  Surgeon: Daneil Dolin, MD;  Location: [facility];  Service: Endoscopy;  Laterality: N/A;  1230  . EYE SURGERY    . LAPAROSCOPIC LYSIS OF ADHESIONS    . PARTIAL COLECTOMY  2007   due to tubulovillous adenoma  . TUBAL LIGATION      Family History  Problem Relation Age of Onset  . Diabetes Mother   . Heart disease Mother   . Cancer Mother        patient unsure of type  . Hip fracture Mother   .  Heart disease Father        antigioplasty  . Cancer Sister        lung  . COPD Sister   . Heart disease Sister   . Atrial fibrillation Sister   . Heart disease Sister        stent in left leg  . Varicose Veins Sister   . Deep vein thrombosis Sister   . Heart disease Brother   . COPD Brother   . COPD Brother   . Huntington's disease Daughter   . Carpal tunnel syndrome Daughter   . Brain cancer Son   . Colon cancer Neg Hx     New complaints: None today  Social history: Her son lives with hr currently  Controlled substance contract: n/a    Review of Systems  Constitutional: Negative for diaphoresis.  Eyes: Negative for pain.  Respiratory: Negative for shortness of breath.     Cardiovascular: Negative for chest pain, palpitations and leg swelling.  Gastrointestinal: Negative for abdominal pain.  Endocrine: Negative for polydipsia.  Skin: Negative for rash.  Neurological: Negative for dizziness, weakness and headaches.  Hematological: Does not bruise/bleed easily.  All other systems reviewed and are negative.      Objective:   Physical Exam Vitals and nursing note reviewed.  Constitutional:      General: She is not in acute distress.    Appearance: Normal appearance. She is well-developed.  HENT:     Head: Normocephalic.     Nose: Nose normal.  Eyes:     Pupils: Pupils are equal, round, and reactive to light.  Neck:     Vascular: No carotid bruit or JVD.  Cardiovascular:     Rate and Rhythm: Normal rate and regular rhythm.     Heart sounds: Normal heart sounds.  Pulmonary:     Effort: Pulmonary effort is normal. No respiratory distress.     Breath sounds: Normal breath sounds. No wheezing or rales.  Chest:     Chest wall: No tenderness.  Abdominal:     General: Bowel sounds are normal. There is no distension or abdominal bruit.     Palpations: Abdomen is soft. There is no hepatomegaly, splenomegaly, mass or pulsatile mass.     Tenderness: There is no abdominal tenderness.  Musculoskeletal:        General: Normal range of motion.     Cervical back: Normal range of motion and neck supple.  Lymphadenopathy:     Cervical: No cervical adenopathy.  Skin:    General: Skin is warm and dry.  Neurological:     Mental Status: She is alert and oriented to person, place, and time.     Deep Tendon Reflexes: Reflexes are normal and symmetric.  Psychiatric:        Behavior: Behavior normal.        Thought Content: Thought content normal.        Judgment: Judgment normal.    BP 133/82   Pulse 74   Temp (!) 97.5 F (36.4 C) (Temporal)   Resp 20   Ht '5\' 1"'  (1.549 m)   Wt 95 lb (43.1 kg)   SpO2 97%   BMI 17.95 kg/m         Assessment & Plan:   ANAIJA WISSINK comes in today with chief complaint of Medical Management of Chronic Issues   Diagnosis and orders addressed:  1. Essential hypertension Low sodium diet - CMP14+EGFR - CBC with Differential/Platelet  2. Pure hypercholesterolemia Low  fat diet - Lipid panel  3. Underweight Make sure drinks ensure daily Do not want ant more weight loss  4. Age related osteoporosis, unspecified pathological fracture presence Continue weight bearing exercises and prolia injections as ordered prolia injection today  5. GAD (generalized anxiety disorder) Stress management  6. Vitamin D insufficiency Continue dailly vitamin d suplement   Labs pending Health Maintenance reviewed Diet and exercise encouraged  Follow up plan: 6 months   [name] Done, FNP

## 2019-05-16 NOTE — Addendum Note (Signed)
Addended by: Rolena Infante on: 05/16/2019 12:59 PM   Modules accepted: Orders

## 2019-05-17 ENCOUNTER — Telehealth: Payer: Self-pay | Admitting: Nurse Practitioner

## 2019-05-17 LAB — CBC WITH DIFFERENTIAL/PLATELET
Basophils Absolute: 0 10*3/uL (ref 0.0–0.2)
Basos: 1 %
EOS (ABSOLUTE): 0.1 10*3/uL (ref 0.0–0.4)
Eos: 3 %
Hematocrit: 31.4 % — ABNORMAL LOW (ref 34.0–46.6)
Hemoglobin: 10.3 g/dL — ABNORMAL LOW (ref 11.1–15.9)
Immature Grans (Abs): 0 10*3/uL (ref 0.0–0.1)
Immature Granulocytes: 0 %
Lymphocytes Absolute: 1.2 10*3/uL (ref 0.7–3.1)
Lymphs: 28 %
MCH: 29.3 pg (ref 26.6–33.0)
MCHC: 32.8 g/dL (ref 31.5–35.7)
MCV: 90 fL (ref 79–97)
Monocytes Absolute: 0.3 10*3/uL (ref 0.1–0.9)
Monocytes: 7 %
Neutrophils Absolute: 2.7 10*3/uL (ref 1.4–7.0)
Neutrophils: 61 %
Platelets: 256 10*3/uL (ref 150–450)
RBC: 3.51 x10E6/uL — ABNORMAL LOW (ref 3.77–5.28)
RDW: 12.7 % (ref 11.7–15.4)
WBC: 4.4 10*3/uL (ref 3.4–10.8)

## 2019-05-17 LAB — CMP14+EGFR
ALT: 10 IU/L (ref 0–32)
AST: 16 IU/L (ref 0–40)
Albumin/Globulin Ratio: 1.7 (ref 1.2–2.2)
Albumin: 4 g/dL (ref 3.7–4.7)
Alkaline Phosphatase: 92 IU/L (ref 39–117)
BUN/Creatinine Ratio: 10 — ABNORMAL LOW (ref 12–28)
BUN: 10 mg/dL (ref 8–27)
Bilirubin Total: 0.7 mg/dL (ref 0.0–1.2)
CO2: 27 mmol/L (ref 20–29)
Calcium: 10.8 mg/dL — ABNORMAL HIGH (ref 8.7–10.3)
Chloride: 104 mmol/L (ref 96–106)
Creatinine, Ser: 1.05 mg/dL — ABNORMAL HIGH (ref 0.57–1.00)
GFR calc Af Amer: 59 mL/min/{1.73_m2} — ABNORMAL LOW (ref >59)
GFR calc non Af Amer: 51 mL/min/{1.73_m2} — ABNORMAL LOW (ref >59)
Globulin, Total: 2.4 g/dL (ref 1.5–4.5)
Glucose: 82 mg/dL (ref 65–99)
Potassium: 3.7 mmol/L (ref 3.5–5.2)
Sodium: 144 mmol/L (ref 134–144)
Total Protein: 6.4 g/dL (ref 6.0–8.5)

## 2019-05-17 LAB — LIPID PANEL
Chol/HDL Ratio: 2.2 ratio (ref 0.0–4.4)
Cholesterol, Total: 118 mg/dL (ref 100–199)
HDL: 53 mg/dL (ref >39)
LDL Chol Calc (NIH): 51 mg/dL (ref 0–99)
Triglycerides: 63 mg/dL (ref 0–149)
VLDL Cholesterol Cal: 14 mg/dL (ref 5–40)

## 2019-05-17 NOTE — Telephone Encounter (Signed)
  REFERRAL REQUEST Telephone Note 05/17/2019  What type of referral do you need? Colonoscopy  Have you been seen at our office for this problem? Yes (Advise that they may need an appointment with their PCP before a referral can be done)  Is there a particular doctor or location that you prefer? [facility]  Patient notified that referrals can take up to a week or longer to process. If they haven't heard anything within a week they should call back and speak with the referral department.   MMM's pt.  Please call pt.

## 2019-10-11 ENCOUNTER — Other Ambulatory Visit: Payer: Self-pay | Admitting: Family Medicine

## 2019-10-11 DIAGNOSIS — E7800 Pure hypercholesterolemia, unspecified: Secondary | ICD-10-CM

## 2019-10-11 DIAGNOSIS — I10 Essential (primary) hypertension: Secondary | ICD-10-CM

## 2019-10-21 ENCOUNTER — Telehealth: Payer: Self-pay | Admitting: Nurse Practitioner

## 2019-10-21 DIAGNOSIS — I10 Essential (primary) hypertension: Secondary | ICD-10-CM

## 2019-10-21 MED ORDER — LISINOPRIL 10 MG PO TABS: 10.0000 mg | ORAL_TABLET | Freq: Every day | ORAL | 0 refills | Status: DC

## 2019-10-21 NOTE — Telephone Encounter (Signed)
  Prescription Request  10/21/2019  What is the name of the medication or equipment? Lisinopril 10 mg #90 No refills left  Have you contacted your pharmacy to request a refill? (if applicable) YES  Which pharmacy would you like this sent to? [facility]   Patient notified that their request is being sent to the clinical staff for review and that they should receive a response within 2 business days.

## 2019-10-29 ENCOUNTER — Other Ambulatory Visit: Payer: Self-pay | Admitting: Family Medicine

## 2019-10-29 DIAGNOSIS — E7800 Pure hypercholesterolemia, unspecified: Secondary | ICD-10-CM

## 2019-11-08 ENCOUNTER — Other Ambulatory Visit: Payer: Self-pay | Admitting: Family Medicine

## 2019-11-15 ENCOUNTER — Other Ambulatory Visit: Payer: Self-pay | Admitting: Family Medicine

## 2019-11-15 DIAGNOSIS — E7800 Pure hypercholesterolemia, unspecified: Secondary | ICD-10-CM

## 2019-11-18 ENCOUNTER — Encounter: Payer: Self-pay | Admitting: Nurse Practitioner

## 2019-11-18 ENCOUNTER — Other Ambulatory Visit: Payer: Self-pay

## 2019-11-18 ENCOUNTER — Ambulatory Visit (INDEPENDENT_AMBULATORY_CARE_PROVIDER_SITE_OTHER): Payer: Medicare Other

## 2019-11-18 ENCOUNTER — Ambulatory Visit (INDEPENDENT_AMBULATORY_CARE_PROVIDER_SITE_OTHER): Payer: Medicare Other | Admitting: Nurse Practitioner

## 2019-11-18 VITALS — BP 135/86 | HR 67 | Temp 97.8°F | Resp 20 | Ht 61.0 in | Wt 95.0 lb

## 2019-11-18 DIAGNOSIS — M810 Age-related osteoporosis without current pathological fracture: Secondary | ICD-10-CM | POA: Diagnosis not present

## 2019-11-18 DIAGNOSIS — Z860100 Personal history of colon polyps, unspecified: Secondary | ICD-10-CM

## 2019-11-18 DIAGNOSIS — E7800 Pure hypercholesterolemia, unspecified: Secondary | ICD-10-CM | POA: Diagnosis not present

## 2019-11-18 DIAGNOSIS — Z23 Encounter for immunization: Secondary | ICD-10-CM

## 2019-11-18 DIAGNOSIS — Z1159 Encounter for screening for other viral diseases: Secondary | ICD-10-CM | POA: Diagnosis not present

## 2019-11-18 DIAGNOSIS — I10 Essential (primary) hypertension: Secondary | ICD-10-CM | POA: Diagnosis not present

## 2019-11-18 DIAGNOSIS — E559 Vitamin D deficiency, unspecified: Secondary | ICD-10-CM

## 2019-11-18 DIAGNOSIS — R636 Underweight: Secondary | ICD-10-CM

## 2019-11-18 DIAGNOSIS — F411 Generalized anxiety disorder: Secondary | ICD-10-CM | POA: Diagnosis not present

## 2019-11-18 MED ORDER — LISINOPRIL 10 MG PO TABS: 10.0000 mg | ORAL_TABLET | Freq: Every day | ORAL | 1 refills | Status: DC

## 2019-11-18 MED ORDER — ATORVASTATIN CALCIUM 80 MG PO TABS: 80.0000 mg | ORAL_TABLET | Freq: Every day | ORAL | 1 refills | Status: DC

## 2019-11-18 MED ORDER — ESCITALOPRAM OXALATE 10 MG PO TABS: 10.0000 mg | ORAL_TABLET | Freq: Every day | ORAL | 1 refills | Status: DC

## 2019-11-18 NOTE — Progress Notes (Signed)
Subjective:    Patient ID: Robin [name], female    DOB: 1940-10-17, 79 y.o.   MRN: [id number]   Chief Complaint: medical management of chronic issues     HPI:  1. Essential hypertension No c/o chest pain, sob or headache. Does not check blood pressure at home. BP Readings from Last 3 Encounters:  05/16/19 133/82  04/24/19 116/83  02/19/19 126/73     2. Pure hypercholesterolemia Has a very poor appetite. She does no exercise. Lab Results  Component Value Date   CHOL 118 05/16/2019   HDL 53 05/16/2019   LDLCALC 51 05/16/2019   TRIG 63 05/16/2019   CHOLHDL 2.2 05/16/2019     3. GAD (generalized anxiety disorder) Is on lexapro and has been for several years. Says she is doing well. GAD 7 : Generalized Anxiety Score 11/18/2019 05/16/2019 12/19/2017  Nervous, Anxious, on Edge 0 0 3  Control/stop worrying 0 0 2  Worry too much - different things 0 0 0  Trouble relaxing 0 0 0  Restless 0 0 0  Easily annoyed or irritable 0 0 2  Afraid - awful might happen 0 0 0  Total GAD 7 Score 0 0 7  Anxiety Difficulty Not difficult at all Not difficult at all Not difficult at all    Depression screen [facility] 2/9 11/18/2019 05/16/2019 04/24/2019  Decreased Interest 0 0 0  Down, Depressed, Hopeless 0 0 0  PHQ - 2 Score 0 0 0  Altered sleeping - - -  Tired, decreased energy - - -  Change in appetite - - -  Feeling bad or failure about yourself  - - -  Trouble concentrating - - -  Moving slowly or fidgety/restless - - -  Suicidal thoughts - - -  PHQ-9 Score - - -      4. Age related osteoporosis, unspecified pathological fracture presence Last dexascan was done 02/04/19. tscore was -3.3. does no weight bearing exercises. Is not on fosamax , but doe snot want to take at this time.  5. Underweight Patient has a history of very poor appetite. Wt Readings from Last 3 Encounters:  05/16/19 95 lb (43.1 kg)  04/24/19 96 lb 3.2 oz (43.6 kg)  02/19/19 95 lb (43.1 kg)   BMI Readings  from Last 3 Encounters:  05/16/19 17.95 kg/m  04/24/19 18.18 kg/m  02/19/19 17.95 kg/m     6. History of colonic polyps Colonoscopy was done in 2016 and needs repeated now due to polyps found.  7. Vitamin D insufficiency Takes a daily vitamin d supplement with calcium. She is on prolia injections She has been walking daily .    Outpatient Encounter Medications as of 11/18/2019  Medication Sig  . aspirin EC 81 MG tablet Take 81 mg by mouth daily.  Marland Kitchen atorvastatin (LIPITOR) 80 MG tablet Take 1 tablet by mouth once daily  . Calcium-Vitamin D (CALTRATE 600 PLUS-VIT D PO) Take 1 tablet by mouth daily.  . Cholecalciferol (VITAMIN D) 2000 UNITS tablet Take 4000IU on saturdays and sundays and 2000IU all other days.  Marland Kitchen denosumab (PROLIA) 60 MG/ML SOSY injection BRING TO OFFICE FOR ADMINISTRATION. ADMINISTER IN UPPER ARM, THIGH OR ABDOMEN  . ENSURE PLUS (ENSURE PLUS) LIQD Take 1 Can by mouth daily. Dx:  R63.6 (underweight)  . escitalopram (LEXAPRO) 10 MG tablet Take 1 tablet (10 mg total) by mouth daily.  Marland Kitchen lisinopril (ZESTRIL) 10 MG tablet Take 1 tablet (10 mg total) by mouth daily.   No  facility-administered encounter medications on file as of 11/18/2019.    Past Surgical History:  Procedure Laterality Date  . 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 COLONOSCOPY  April 2007   Dr. Sharlett Iles: 5cm circumferential fungating tumor at the hepatic flexure, path tubulovillous adenoma.   . COLONOSCOPY  Jan 02, 2006   Dr. 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 Multiple polyps around anastomosis of different sizes,   . COLONOSCOPY  2008   Dr. Sharlett Iles. Referred to Dr. Georgette Dover for resection  . COLONOSCOPY N/A 07/18/2014   Procedure: COLONOSCOPY;  Surgeon: Daneil Dolin, MD;  Location: [facility];  Service: Endoscopy;  Laterality: N/A;  1230  . EYE SURGERY    . LAPAROSCOPIC LYSIS OF  ADHESIONS    . PARTIAL COLECTOMY  2007   due to tubulovillous adenoma  . TUBAL LIGATION      Family History  Problem Relation Age of Onset  . Diabetes Mother   . Heart disease Mother   . Cancer Mother        patient unsure of type  . Hip fracture Mother   . Heart disease Father        antigioplasty  . Cancer Sister        lung  . COPD Sister   . Heart disease Sister   . Atrial fibrillation Sister   . Heart disease Sister        stent in left leg  . Varicose Veins Sister   . Deep vein thrombosis Sister   . Heart disease Brother   . COPD Brother   . COPD Brother   . Huntington's disease Daughter   . Carpal tunnel syndrome Daughter   . Brain cancer Son   . Colon cancer Neg Hx     New complaints: Has lesion on left leg that she wants looked at. Just found it over the weekend.  Social history: Her son is still living with her.  Controlled substance contract: n/a    Review of Systems  Constitutional: Negative for diaphoresis.  Eyes: Negative for pain.  Respiratory: Negative for shortness of breath.   Cardiovascular: Negative for chest pain, palpitations and leg swelling.  Gastrointestinal: Negative for abdominal pain.  Endocrine: Negative for polydipsia.  Skin: Negative for rash.  Neurological: Negative for dizziness, weakness and headaches.  Hematological: Does not bruise/bleed easily.  All other systems reviewed and are negative.      Objective:   Physical Exam Vitals and nursing note reviewed.  Constitutional:      General: She is not in acute distress.    Appearance: Normal appearance. She is well-developed.  HENT:     Head: Normocephalic.     Nose: Nose normal.  Eyes:     Pupils: Pupils are equal, round, and reactive to light.  Neck:     Vascular: No carotid bruit or JVD.  Cardiovascular:     Rate and Rhythm: Normal rate and regular rhythm.     Heart sounds: Normal heart sounds.  Pulmonary:     Effort: Pulmonary effort is normal. No respiratory  distress.     Breath sounds: Normal breath sounds. No wheezing or rales.  Chest:     Chest wall: No tenderness.  Abdominal:     General: Bowel sounds are normal. There is no distension or abdominal bruit.     Palpations: Abdomen is soft. There is no hepatomegaly, splenomegaly, mass or pulsatile mass.  Tenderness: There is no abdominal tenderness.  Musculoskeletal:        General: Normal range of motion.     Cervical back: Normal range of motion and neck supple.  Lymphadenopathy:     Cervical: No cervical adenopathy.  Skin:    General: Skin is warm and dry.     Comments: 3cm annular scabbed over area to left shin- no erythema or drainage.  Neurological:     Mental Status: She is alert and oriented to person, place, and time.     Deep Tendon Reflexes: Reflexes are normal and symmetric.  Psychiatric:        Behavior: Behavior normal.        Thought Content: Thought content normal.        Judgment: Judgment normal.     BP 135/86   Pulse 67   Temp 97.8 F (36.6 C) (Temporal)   Resp 20   Ht 5\' 1"  (1.549 m)   Wt 95 lb (43.1 kg)   SpO2 100%   BMI 17.95 kg/m   EKG- NSR-Robin Dajohn Ellender, FNP Chest xray- no acute ir chronic changes noted      Assessment & Plan:  Robin [name] comes in today with chief complaint of Medical Management of Chronic Issues   Diagnosis and orders addressed:  1. Essential hypertension Low sodium diet - DG Chest 2 View - EKG 12-Lead  2. Pure hypercholesterolemia Low fat diet  3. GAD (generalized anxiety disorder) Stress management  4. Age related osteoporosis, unspecified pathological fracture presence Continue weight bearing exercises Continue prolia every 6 months  5. Underweight Eat at least 3 meals a day.  6. History of colonic polyps - Ambulatory referral to Gastroenterology  7. Vitamin D insufficiency Continue daly vitamind and calcium supplements  Meds ordered this encounter  Medications  . lisinopril (ZESTRIL) 10  MG tablet    Sig: Take 1 tablet (10 mg total) by mouth daily.    Dispense:  90 tablet    Refill:  1    Order Specific Question:   Supervising Provider    Answer:   Caryl Pina A [id number]  . escitalopram (LEXAPRO) 10 MG tablet    Sig: Take 1 tablet (10 mg total) by mouth daily.    Dispense:  90 tablet    Refill:  1    Order Specific Question:   Supervising Provider    Answer:   Caryl Pina A [id number]  . atorvastatin (LIPITOR) 80 MG tablet    Sig: Take 1 tablet (80 mg total) by mouth daily.    Dispense:  90 tablet    Refill:  1    Order Specific Question:   Supervising Provider    Answer:   Caryl Pina A [id number]    Labs pending Health Maintenance reviewed Diet and exercise encouraged  Follow up plan: 6 months   Robin Hassell Done, FNP

## 2019-11-18 NOTE — Patient Instructions (Signed)

## 2019-11-19 ENCOUNTER — Encounter (INDEPENDENT_AMBULATORY_CARE_PROVIDER_SITE_OTHER): Payer: Self-pay

## 2019-11-19 LAB — HEPATITIS C ANTIBODY: Hep C Virus Ab: <0.1 s/co ratio (ref 0.0–0.9)

## 2019-11-21 ENCOUNTER — Telehealth: Payer: Self-pay | Admitting: Nurse Practitioner

## 2019-11-21 NOTE — Telephone Encounter (Signed)
Formed typed up and placed on providers desk

## 2019-11-22 DIAGNOSIS — Z029 Encounter for administrative examinations, unspecified: Secondary | ICD-10-CM

## 2019-11-27 NOTE — Telephone Encounter (Signed)
Pt aware form ready for pickup

## 2019-11-28 ENCOUNTER — Other Ambulatory Visit: Payer: Self-pay | Admitting: Family Medicine

## 2019-11-28 DIAGNOSIS — E7800 Pure hypercholesterolemia, unspecified: Secondary | ICD-10-CM

## 2019-12-26 ENCOUNTER — Other Ambulatory Visit: Payer: Self-pay | Admitting: Family Medicine

## 2019-12-26 DIAGNOSIS — E7800 Pure hypercholesterolemia, unspecified: Secondary | ICD-10-CM

## 2020-01-23 ENCOUNTER — Telehealth: Payer: Self-pay | Admitting: Nurse Practitioner

## 2020-01-23 DIAGNOSIS — I10 Essential (primary) hypertension: Secondary | ICD-10-CM

## 2020-01-23 NOTE — Telephone Encounter (Signed)
Pt will check with [facility] again asking for refill by name instead of an Rx # refill was sent in on 11/18/19 #90 with a refill

## 2020-01-23 NOTE — Telephone Encounter (Signed)
  Prescription Request  01/23/2020  What is the name of the medication or equipment? lisinopril  Have you contacted your pharmacy to request a refill? (if applicable) yes   Which pharmacy would you like this sent to? [facility] in [place]    Patient notified that their request is being sent to the clinical staff for review and that they should receive a response within 2 business days.

## 2020-01-27 ENCOUNTER — Other Ambulatory Visit: Payer: Self-pay

## 2020-01-27 ENCOUNTER — Other Ambulatory Visit: Payer: Medicare Other

## 2020-01-29 ENCOUNTER — Telehealth: Payer: Self-pay | Admitting: Nurse Practitioner

## 2020-01-29 DIAGNOSIS — I10 Essential (primary) hypertension: Secondary | ICD-10-CM

## 2020-01-29 NOTE — Telephone Encounter (Signed)
°  Prescription Request  01/29/2020  What is the name of the medication or equipment? Lisinopril and prolia  Have you contacted your pharmacy to request a refill? (if applicable) yes   Which pharmacy would you like this sent to? [facility] in [facility]   Patient notified that their request is being sent to the clinical staff for review and that they should receive a response within 2 business days.

## 2020-02-03 ENCOUNTER — Other Ambulatory Visit: Payer: Self-pay

## 2020-02-03 MED ORDER — PROLIA 60 MG/ML ~~LOC~~ SOSY: PREFILLED_SYRINGE | SUBCUTANEOUS | 0 refills | Status: DC

## 2020-02-03 NOTE — Telephone Encounter (Signed)
Prolia refill sent to [facility]. Patient notified and verbalized understanding. Will pick up and bring to office for injection

## 2020-02-03 NOTE — Telephone Encounter (Signed)
Prolia sent to pharmacy and patient notified

## 2020-02-04 ENCOUNTER — Other Ambulatory Visit: Payer: Self-pay

## 2020-02-04 ENCOUNTER — Ambulatory Visit (INDEPENDENT_AMBULATORY_CARE_PROVIDER_SITE_OTHER): Payer: Medicare Other | Admitting: *Deleted

## 2020-02-04 DIAGNOSIS — M810 Age-related osteoporosis without current pathological fracture: Secondary | ICD-10-CM

## 2020-02-04 MED ORDER — DENOSUMAB 60 MG/ML ~~LOC~~ SOSY
60.0000 mg | PREFILLED_SYRINGE | Freq: Once | SUBCUTANEOUS | Status: AC
  Administered 2020-02-04: 11:00:00 60 mg via SUBCUTANEOUS

## 2020-02-04 NOTE — Progress Notes (Signed)
Patient in today for Prolia injection. 60 mg given SQ in left arm. Patient tolerated well.  

## 2020-03-18 ENCOUNTER — Other Ambulatory Visit (INDEPENDENT_AMBULATORY_CARE_PROVIDER_SITE_OTHER): Payer: Self-pay

## 2020-03-18 ENCOUNTER — Telehealth (INDEPENDENT_AMBULATORY_CARE_PROVIDER_SITE_OTHER): Payer: Self-pay

## 2020-03-18 ENCOUNTER — Encounter (INDEPENDENT_AMBULATORY_CARE_PROVIDER_SITE_OTHER): Payer: Self-pay

## 2020-03-18 DIAGNOSIS — Z1211 Encounter for screening for malignant neoplasm of colon: Secondary | ICD-10-CM

## 2020-03-18 MED ORDER — PEG 3350-KCL-NA BICARB-NACL 420 G PO SOLR: 4000.0000 mL | Freq: Once | ORAL | 0 refills | Status: DC

## 2020-03-18 NOTE — Telephone Encounter (Signed)
Robin [name], CMA  

## 2020-03-18 NOTE — Telephone Encounter (Signed)
Referring MD/PCP: Chevis Pretty   Procedure: Tcs  Reason/Indication:  Screening  Has patient had this procedure before?  Yes   If so, when, by whom and where? 1996    Is there a family history of colon cancer?  no  Who?  What age when diagnosed?    Is patient diabetic?   no      Does patient have prosthetic heart valve or mechanical valve?  no  Do you have a pacemaker/defibrillator?  no  Has patient ever had endocarditis/atrial fibrillation? no  Have you had a stroke/heart attack last 6 mths? no  Does patient use oxygen? no  Has patient had joint replacement within last 12 months?  no  Is patient constipated or do they take laxatives? yes  Does patient have a history of alcohol/drug use?  no  Is patient on blood thinner such as Coumadin, Plavix and/or Aspirin? yes  Medications: Atorvastatin 80 mg daily, vit D 50 mg daily, escitalopram 10 mg daily, asa 81 mg daily  Allergies: nkda  Medication Adjustment per Dr Laural Golden: Aspirin 81 mg 2 days prior  Procedure date & time: 03/25/20 9:45

## 2020-03-20 NOTE — Patient Instructions (Signed)
Robin [name]  03/20/2020     @PREFPERIOPPHARMACY @   Your procedure is scheduled on 03/25/20.  Report to [facility] at 8:25 A.M.  Call this number if you have problems the morning of surgery:  [phone]   Remember:  Do not eat or drink after midnight.                      Take these medicines the morning of surgery with A SIP OF WATER lexapro & lisinopril    Do not wear jewelry, make-up or nail polish.  Do not wear lotions, powders, or perfumes, or deodorant.  Do not shave 48 hours prior to surgery.  Men may shave face and neck.  Do not bring valuables to the hospital.  [facility] is not responsible for any belongings or valuables.  Contacts, dentures or bridgework may not be worn into surgery.  Leave your suitcase in the car.  After surgery it may be brought to your room.  For patients admitted to the hospital, discharge time will be determined by your treatment team.  Patients discharged the day of surgery will not be allowed to drive home and need someone to stay with them for 24 hours.  Name and phone number of your driver:   family Special instructions: Please follow colon prep instructions that was provided from doctor's office.                           Please read over the following fact sheets that you were given. Anesthesia Post-op Instructions and Care and Recovery After Surgery       Monitored Anesthesia Care, Care After This sheet gives you information about how to care for yourself after your procedure. Your health care provider may also give you more specific instructions. If you have problems or questions, contact your health care provider. What can I expect after the procedure? After the procedure, it is common to have:  Tiredness.  Forgetfulness about what happened after the procedure.  Impaired judgment for important decisions.  Nausea or vomiting.  Some difficulty with balance. Follow these instructions at home: For the time period you  were told by your health care provider:  Rest as needed.  Do not participate in activities where you could fall or become injured.  Do not drive or use machinery.  Do not drink alcohol.  Do not take sleeping pills or medicines that cause drowsiness.  Do not make important decisions or sign legal documents.  Do not take care of children on your own.      Eating and drinking  Follow the diet that is recommended by your health care provider.  Drink enough fluid to keep your urine pale yellow.  If you vomit: ? Drink water, juice, or soup when you can drink without vomiting. ? Make sure you have little or no nausea before eating solid foods. General instructions  Have a responsible adult stay with you for the time you are told. It is important to have someone help care for you until you are awake and alert.  Take over-the-counter and prescription medicines only as told by your health care provider.  If you have sleep apnea, surgery and certain medicines can increase your risk for breathing problems. Follow instructions from your health care provider about wearing your sleep device: ? Anytime you are sleeping, including during daytime naps. ? While taking prescription pain medicines, sleeping medicines, or  medicines that make you drowsy.  Avoid smoking.  Keep all follow-up visits as told by your health care provider. This is important. Contact a health care provider if:  You keep feeling nauseous or you keep vomiting.  You feel light-headed.  You are still sleepy or having trouble with balance after 24 hours.  You develop a rash.  You have a fever.  You have redness or swelling around the IV site. Get help right away if:  You have trouble breathing.  You have new-onset confusion at home. Summary  For several hours after your procedure, you may feel tired. You may also be forgetful and have poor judgment.  Have a responsible adult stay with you for the time you  are told. It is important to have someone help care for you until you are awake and alert.  Rest as told. Do not drive or operate machinery. Do not drink alcohol or take sleeping pills.  Get help right away if you have trouble breathing, or if you suddenly become confused. This information is not intended to replace advice given to you by your health care provider. Make sure you discuss any questions you have with your health care provider. Document Revised: 10/24/2019 Document Reviewed: 01/10/2019 Elsevier Patient Education  2021 [place].  PATIENT INSTRUCTIONS POST-ANESTHESIA  IMMEDIATELY FOLLOWING SURGERY:  Do not drive or operate machinery for the first twenty four hours after surgery.  Do not make any important decisions for twenty four hours after surgery or while taking narcotic pain medications or sedatives.  If you develop intractable nausea and vomiting or a severe headache please notify your doctor immediately.  FOLLOW-UP:  Please make an appointment with your surgeon as instructed. You do not need to follow up with anesthesia unless specifically instructed to do so.  WOUND CARE INSTRUCTIONS (if applicable):  Keep a dry clean dressing on the anesthesia/puncture wound site if there is drainage.  Once the wound has quit draining you may leave it open to air.  Generally you should leave the bandage intact for twenty four hours unless there is drainage.  If the epidural site drains for more than 36-48 hours please call the anesthesia department.  QUESTIONS?:  Please feel free to call your physician or the hospital operator if you have any questions, and they will be happy to assist you.      Colonoscopy, Adult A colonoscopy is a procedure to look at the entire large intestine. This procedure is done using a long, thin, flexible tube that has a camera on the end. You may have a colonoscopy:  As a part of normal colorectal screening.  If you have certain symptoms, such as: ? A  low number of red blood cells in your blood (anemia). ? Diarrhea that does not go away. ? Pain in your abdomen. ? Blood in your stool. A colonoscopy can help screen for and diagnose medical problems, including:  Tumors.  Extra tissue that grows where mucus forms (polyps).  Inflammation.  Areas of bleeding. Tell your health care provider about:  Any allergies you have.  All medicines you are taking, including vitamins, herbs, eye drops, creams, and over-the-counter medicines.  Any problems you or family members have had with anesthetic medicines.  Any blood disorders you have.  Any surgeries you have had.  Any medical conditions you have.  Any problems you have had with having bowel movements.  Whether you are pregnant or may be pregnant. What are the risks? Generally, this is a  safe procedure. However, problems may occur, including:  Bleeding.  Damage to your intestine.  Allergic reactions to medicines given during the procedure.  Infection. This is rare. What happens before the procedure? Eating and drinking restrictions Follow instructions from your health care provider about eating or drinking restrictions, which may include:  A few days before the procedure: ? Follow a low-fiber diet. ? Avoid nuts, seeds, dried fruit, raw fruits, and vegetables.  1-3 days before the procedure: ? Eat only gelatin dessert or ice pops. ? Drink only clear liquids, such as water, clear juice, clear broth or bouillon, black coffee or tea, or clear soft drinks or sports drinks. ? Avoid liquids that contain red or purple dye.  The day of the procedure: ? Do not eat solid foods. You may continue to drink clear liquids until up to 2 hours before the procedure. ? Do not eat or drink anything starting 2 hours before the procedure, or within the time period that your health care provider recommends. Bowel prep If you were prescribed a bowel prep to take by mouth (orally) to clean out  your colon:  Take it as told by your health care provider. Starting the day before your procedure, you will need to drink a large amount of liquid medicine. The liquid will cause you to have many bowel movements of loose stool until your stool becomes almost clear or light green.  If your skin or the opening between the buttocks (anus) gets irritated from diarrhea, you may relieve the irritation using: ? Wipes with medicine in them, such as adult wet wipes with aloe and vitamin E. ? A product to soothe skin, such as petroleum jelly.  If you vomit while drinking the bowel prep: ? Take a break for up to 60 minutes. ? Begin the bowel prep again. ? Call your health care provider if you keep vomiting or you cannot take the bowel prep without vomiting.  To clean out your colon, you may also be given: ? Laxative medicines. These help you have a bowel movement. ? Instructions for enema use. An enema is liquid medicine injected into your rectum. Medicines Ask your health care provider about:  Changing or stopping your regular medicines or supplements. This is especially important if you are taking iron supplements, diabetes medicines, or blood thinners.  Taking medicines such as aspirin and ibuprofen. These medicines can thin your blood. Do not take these medicines unless your health care provider tells you to take them.  Taking over-the-counter medicines, vitamins, herbs, and supplements. General instructions  Ask your health care provider what steps will be taken to help prevent infection. These may include washing skin with a germ-killing soap.  Plan to have someone take you home from the hospital or clinic. What happens during the procedure?  An IV will be inserted into one of your veins.  You may be given one or more of the following: ? A medicine to help you relax (sedative). ? A medicine to numb the area (local anesthetic). ? A medicine to make you fall asleep (general anesthetic).  This is rarely needed.  You will lie on your side with your knees bent.  The tube will: ? Have oil or gel put on it (be lubricated). ? Be inserted into your anus. ? Be gently eased through all parts of your large intestine.  Air will be sent into your colon to keep it open. This may cause some pressure or cramping.  Images will be taken with  the camera and will appear on a screen.  A small tissue sample may be removed to be looked at under a microscope (biopsy). The tissue may be sent to a lab for testing if any signs of problems are found.  If small polyps are found, they may be removed and checked for cancer cells.  When the procedure is finished, the tube will be removed. The procedure may vary among health care providers and hospitals.   What happens after the procedure?  Your blood pressure, heart rate, breathing rate, and blood oxygen level will be monitored until you leave the hospital or clinic.  You may have a small amount of blood in your stool.  You may pass gas and have mild cramping or bloating in your abdomen. This is caused by the air that was used to open your colon during the exam.  Do not drive for 24 hours after the procedure.  It is up to you to get the results of your procedure. Ask your health care provider, or the department that is doing the procedure, when your results will be ready. Summary  A colonoscopy is a procedure to look at the entire large intestine.  Follow instructions from your health care provider about eating and drinking before the procedure.  If you were prescribed an oral bowel prep to clean out your colon, take it as told by your health care provider.  During the colonoscopy, a flexible tube with a camera on its end is inserted into the anus and then passed into the other parts of the large intestine. This information is not intended to replace advice given to you by your health care provider. Make sure you discuss any questions you  have with your health care provider. Document Revised: 08/31/2018 Document Reviewed: 08/31/2018 Elsevier Patient Education  [place].

## 2020-03-24 ENCOUNTER — Encounter (HOSPITAL_COMMUNITY): Payer: Self-pay

## 2020-03-24 ENCOUNTER — Encounter (INDEPENDENT_AMBULATORY_CARE_PROVIDER_SITE_OTHER): Payer: Self-pay

## 2020-03-24 ENCOUNTER — Other Ambulatory Visit (HOSPITAL_COMMUNITY)
Admission: RE | Admit: 2020-03-24 | Discharge: 2020-03-24 | Disposition: A | Discharge status: Home / Self Care | Payer: Medicare Other | Source: Ambulatory Visit | Attending: Internal Medicine | Admitting: Internal Medicine

## 2020-03-24 ENCOUNTER — Encounter (HOSPITAL_COMMUNITY)
Admission: RE | Admit: 2020-03-24 | Discharge: 2020-03-24 | Disposition: A | Discharge status: Home / Self Care | Payer: Medicare Other | Source: Ambulatory Visit | Attending: Internal Medicine | Admitting: Internal Medicine

## 2020-04-06 NOTE — Patient Instructions (Signed)
Robin [name]  04/06/2020     @PREFPERIOPPHARMACY @   Your procedure is scheduled on  04/08/2020.   Report to [facility] at  [place].M.   Call this number if you have problems the morning of surgery:  [phone]   Remember:  Follow the diet and prep instructions given to you by the office.                     Take these medicines the morning of surgery with A SIP OF WATER    Lexapro.    Please brush your teeth.  Do not wear jewelry, make-up or nail polish.  Do not wear lotions, powders, or perfumes, or deodorant.  Do not shave 48 hours prior to surgery.  Men may shave face and neck.  Do not bring valuables to the hospital.  [facility] is not responsible for any belongings or valuables.  Contacts, dentures or bridgework may not be worn into surgery.  Leave your suitcase in the car.  After surgery it may be brought to your room.  For patients admitted to the hospital, discharge time will be determined by your treatment team.  Patients discharged the day of surgery will not be allowed to drive home and must have someone with them for 24 hours.   Special instructions:   DO NOT smoke tobacco or vape the morning of your procedure.   Please read over the following fact sheets that you were given. Anesthesia Post-op Instructions and Care and Recovery After Surgery       Colonoscopy, Adult, Care After This sheet gives you information about how to care for yourself after your procedure. Your health care provider may also give you more specific instructions. If you have problems or questions, contact your health care provider. What can I expect after the procedure? After the procedure, it is common to have:  A small amount of blood in your stool for 24 hours after the procedure.  Some gas.  Mild cramping or bloating of your abdomen. Follow these instructions at home: Eating and drinking  Drink enough fluid to keep your urine pale yellow.  Follow  instructions from your health care provider about eating or drinking restrictions.  Resume your normal diet as instructed by your health care provider. Avoid heavy or fried foods that are hard to digest.   Activity  Rest as told by your health care provider.  Avoid sitting for a long time without moving. Get up to take short walks every 1-2 hours. This is important to improve blood flow and breathing. Ask for help if you feel weak or unsteady.  Return to your normal activities as told by your health care provider. Ask your health care provider what activities are safe for you. Managing cramping and bloating  Try walking around when you have cramps or feel bloated.  Apply heat to your abdomen as told by your health care provider. Use the heat source that your health care provider recommends, such as a moist heat pack or a heating pad. ? Place a towel between your skin and the heat source. ? Leave the heat on for 20-30 minutes. ? Remove the heat if your skin turns bright red. This is especially important if you are unable to feel pain, heat, or cold. You may have a greater risk of getting burned.   General instructions  If you were given a sedative during the procedure, it  can affect you for several hours. Do not drive or operate machinery until your health care provider says that it is safe.  For the first 24 hours after the procedure: ? Do not sign important documents. ? Do not drink alcohol. ? Do your regular daily activities at a slower pace than normal. ? Eat soft foods that are easy to digest.  Take over-the-counter and prescription medicines only as told by your health care provider.  Keep all follow-up visits as told by your health care provider. This is important. Contact a health care provider if:  You have blood in your stool 2-3 days after the procedure. Get help right away if you have:  More than a small spotting of blood in your stool.  Large blood clots in your  stool.  Swelling of your abdomen.  Nausea or vomiting.  A fever.  Increasing pain in your abdomen that is not relieved with medicine. Summary  After the procedure, it is common to have a small amount of blood in your stool. You may also have mild cramping and bloating of your abdomen.  If you were given a sedative during the procedure, it can affect you for several hours. Do not drive or operate machinery until your health care provider says that it is safe.  Get help right away if you have a lot of blood in your stool, nausea or vomiting, a fever, or increased pain in your abdomen. This information is not intended to replace advice given to you by your health care provider. Make sure you discuss any questions you have with your health care provider. Document Revised: 02/01/2019 Document Reviewed: 09/03/2018 Elsevier Patient Education  2021 [employer] After This sheet gives you information about how to care for yourself after your procedure. Your health care provider may also give you more specific instructions. If you have problems or questions, contact your health care provider. What can I expect after the procedure? After the procedure, it is common to have:  Tiredness.  Forgetfulness about what happened after the procedure.  Impaired judgment for important decisions.  Nausea or vomiting.  Some difficulty with balance. Follow these instructions at home: For the time period you were told by your health care provider:  Rest as needed.  Do not participate in activities where you could fall or become injured.  Do not drive or use machinery.  Do not drink alcohol.  Do not take sleeping pills or medicines that cause drowsiness.  Do not make important decisions or sign legal documents.  Do not take care of children on your own.      Eating and drinking  Follow the diet that is recommended by your health care provider.  Drink  enough fluid to keep your urine pale yellow.  If you vomit: ? Drink water, juice, or soup when you can drink without vomiting. ? Make sure you have little or no nausea before eating solid foods. General instructions  Have a responsible adult stay with you for the time you are told. It is important to have someone help care for you until you are awake and alert.  Take over-the-counter and prescription medicines only as told by your health care provider.  If you have sleep apnea, surgery and certain medicines can increase your risk for breathing problems. Follow instructions from your health care provider about wearing your sleep device: ? Anytime you are sleeping, including during daytime naps. ? While taking prescription pain medicines, sleeping medicines,  or medicines that make you drowsy.  Avoid smoking.  Keep all follow-up visits as told by your health care provider. This is important. Contact a health care provider if:  You keep feeling nauseous or you keep vomiting.  You feel light-headed.  You are still sleepy or having trouble with balance after 24 hours.  You develop a rash.  You have a fever.  You have redness or swelling around the IV site. Get help right away if:  You have trouble breathing.  You have new-onset confusion at home. Summary  For several hours after your procedure, you may feel tired. You may also be forgetful and have poor judgment.  Have a responsible adult stay with you for the time you are told. It is important to have someone help care for you until you are awake and alert.  Rest as told. Do not drive or operate machinery. Do not drink alcohol or take sleeping pills.  Get help right away if you have trouble breathing, or if you suddenly become confused. This information is not intended to replace advice given to you by your health care provider. Make sure you discuss any questions you have with your health care provider. Document Revised:  10/24/2019 Document Reviewed: 01/10/2019 Elsevier Patient Education  2021 [employer].

## 2020-04-07 ENCOUNTER — Encounter (HOSPITAL_COMMUNITY): Payer: Self-pay

## 2020-04-07 ENCOUNTER — Encounter (HOSPITAL_COMMUNITY)
Admission: RE | Admit: 2020-04-07 | Discharge: 2020-04-07 | Disposition: A | Discharge status: Home / Self Care | Payer: Medicare Other | Source: Ambulatory Visit | Attending: Internal Medicine | Admitting: Internal Medicine

## 2020-04-07 ENCOUNTER — Other Ambulatory Visit (HOSPITAL_COMMUNITY): Payer: Medicare Other

## 2020-04-17 NOTE — Patient Instructions (Signed)
Robin [name]  04/17/2020     @PREFPERIOPPHARMACY @   Your procedure is scheduled on  04/22/2020.   Report to [facility] at  [place].M.   Call this number if you have problems the morning of surgery:  [phone]   Remember:  Follow the diet and prep instructions given to you by the office.                      Take these medicines the morning of surgery with A SIP OF WATER  Lexapro.    Please brush your teeth.  Do not wear jewelry, make-up or nail polish.  Do not wear lotions, powders, or perfumes, or deodorant.  Do not shave 48 hours prior to surgery.  Men may shave face and neck.  Do not bring valuables to the hospital.  [facility] is not responsible for any belongings or valuables.  Contacts, dentures or bridgework may not be worn into surgery.  Leave your suitcase in the car.  After surgery it may be brought to your room.  For patients admitted to the hospital, discharge time will be determined by your treatment team.  Patients discharged the day of surgery will not be allowed to drive home and must have someone with them for 24 hours.   Special instructions:   DO NOT smoke tobacco or vape the morning of your procedure.   Please read over the following fact sheets that you were given. Anesthesia Post-op Instructions and Care and Recovery After Surgery       Colonoscopy, Adult, Care After This sheet gives you information about how to care for yourself after your procedure. Your health care provider may also give you more specific instructions. If you have problems or questions, contact your health care provider. What can I expect after the procedure? After the procedure, it is common to have:  A small amount of blood in your stool for 24 hours after the procedure.  Some gas.  Mild cramping or bloating of your abdomen. Follow these instructions at home: Eating and drinking  Drink enough fluid to keep your urine pale yellow.  Follow instructions  from your health care provider about eating or drinking restrictions.  Resume your normal diet as instructed by your health care provider. Avoid heavy or fried foods that are hard to digest.   Activity  Rest as told by your health care provider.  Avoid sitting for a long time without moving. Get up to take short walks every 1-2 hours. This is important to improve blood flow and breathing. Ask for help if you feel weak or unsteady.  Return to your normal activities as told by your health care provider. Ask your health care provider what activities are safe for you. Managing cramping and bloating  Try walking around when you have cramps or feel bloated.  Apply heat to your abdomen as told by your health care provider. Use the heat source that your health care provider recommends, such as a moist heat pack or a heating pad. ? Place a towel between your skin and the heat source. ? Leave the heat on for 20-30 minutes. ? Remove the heat if your skin turns bright red. This is especially important if you are unable to feel pain, heat, or cold. You may have a greater risk of getting burned.   General instructions  If you were given a sedative during the procedure, it can affect you  for several hours. Do not drive or operate machinery until your health care provider says that it is safe.  For the first 24 hours after the procedure: ? Do not sign important documents. ? Do not drink alcohol. ? Do your regular daily activities at a slower pace than normal. ? Eat soft foods that are easy to digest.  Take over-the-counter and prescription medicines only as told by your health care provider.  Keep all follow-up visits as told by your health care provider. This is important. Contact a health care provider if:  You have blood in your stool 2-3 days after the procedure. Get help right away if you have:  More than a small spotting of blood in your stool.  Large blood clots in your  stool.  Swelling of your abdomen.  Nausea or vomiting.  A fever.  Increasing pain in your abdomen that is not relieved with medicine. Summary  After the procedure, it is common to have a small amount of blood in your stool. You may also have mild cramping and bloating of your abdomen.  If you were given a sedative during the procedure, it can affect you for several hours. Do not drive or operate machinery until your health care provider says that it is safe.  Get help right away if you have a lot of blood in your stool, nausea or vomiting, a fever, or increased pain in your abdomen. This information is not intended to replace advice given to you by your health care provider. Make sure you discuss any questions you have with your health care provider. Document Revised: 02/01/2019 Document Reviewed: 09/03/2018 Elsevier Patient Education  2021 [employer] After This sheet gives you information about how to care for yourself after your procedure. Your health care provider may also give you more specific instructions. If you have problems or questions, contact your health care provider. What can I expect after the procedure? After the procedure, it is common to have:  Tiredness.  Forgetfulness about what happened after the procedure.  Impaired judgment for important decisions.  Nausea or vomiting.  Some difficulty with balance. Follow these instructions at home: For the time period you were told by your health care provider:  Rest as needed.  Do not participate in activities where you could fall or become injured.  Do not drive or use machinery.  Do not drink alcohol.  Do not take sleeping pills or medicines that cause drowsiness.  Do not make important decisions or sign legal documents.  Do not take care of children on your own.      Eating and drinking  Follow the diet that is recommended by your health care provider.  Drink  enough fluid to keep your urine pale yellow.  If you vomit: ? Drink water, juice, or soup when you can drink without vomiting. ? Make sure you have little or no nausea before eating solid foods. General instructions  Have a responsible adult stay with you for the time you are told. It is important to have someone help care for you until you are awake and alert.  Take over-the-counter and prescription medicines only as told by your health care provider.  If you have sleep apnea, surgery and certain medicines can increase your risk for breathing problems. Follow instructions from your health care provider about wearing your sleep device: ? Anytime you are sleeping, including during daytime naps. ? While taking prescription pain medicines, sleeping medicines, or medicines that  make you drowsy.  Avoid smoking.  Keep all follow-up visits as told by your health care provider. This is important. Contact a health care provider if:  You keep feeling nauseous or you keep vomiting.  You feel light-headed.  You are still sleepy or having trouble with balance after 24 hours.  You develop a rash.  You have a fever.  You have redness or swelling around the IV site. Get help right away if:  You have trouble breathing.  You have new-onset confusion at home. Summary  For several hours after your procedure, you may feel tired. You may also be forgetful and have poor judgment.  Have a responsible adult stay with you for the time you are told. It is important to have someone help care for you until you are awake and alert.  Rest as told. Do not drive or operate machinery. Do not drink alcohol or take sleeping pills.  Get help right away if you have trouble breathing, or if you suddenly become confused. This information is not intended to replace advice given to you by your health care provider. Make sure you discuss any questions you have with your health care provider. Document Revised:  10/24/2019 Document Reviewed: 01/10/2019 Elsevier Patient Education  2021 [employer].

## 2020-04-20 ENCOUNTER — Other Ambulatory Visit: Payer: Self-pay

## 2020-04-20 ENCOUNTER — Other Ambulatory Visit (HOSPITAL_COMMUNITY)
Admission: RE | Admit: 2020-04-20 | Discharge: 2020-04-20 | Disposition: A | Discharge status: Home / Self Care | Payer: Medicare Other | Source: Ambulatory Visit | Attending: Internal Medicine | Admitting: Internal Medicine

## 2020-04-20 ENCOUNTER — Encounter (HOSPITAL_COMMUNITY)
Admission: RE | Admit: 2020-04-20 | Discharge: 2020-04-20 | Disposition: A | Discharge status: Home / Self Care | Payer: Medicare Other | Source: Ambulatory Visit | Attending: Internal Medicine | Admitting: Internal Medicine

## 2020-04-20 ENCOUNTER — Encounter (HOSPITAL_COMMUNITY): Payer: Self-pay

## 2020-04-20 DIAGNOSIS — Z01812 Encounter for preprocedural laboratory examination: Secondary | ICD-10-CM | POA: Insufficient documentation

## 2020-04-20 DIAGNOSIS — Z20822 Contact with and (suspected) exposure to covid-19: Secondary | ICD-10-CM | POA: Insufficient documentation

## 2020-04-20 LAB — CBC WITH DIFFERENTIAL/PLATELET
Abs Immature Granulocytes: 0.01 10*3/uL (ref 0.00–0.07)
Basophils Absolute: 0 10*3/uL (ref 0.0–0.1)
Basophils Relative: 1 %
Eosinophils Absolute: 0 10*3/uL (ref 0.0–0.5)
Eosinophils Relative: 1 %
HCT: 32.8 % — ABNORMAL LOW (ref 36.0–46.0)
Hemoglobin: 10.6 g/dL — ABNORMAL LOW (ref 12.0–15.0)
Immature Granulocytes: 0 %
Lymphocytes Relative: 31 %
Lymphs Abs: 1 10*3/uL (ref 0.7–4.0)
MCH: 30.3 pg (ref 26.0–34.0)
MCHC: 32.3 g/dL (ref 30.0–36.0)
MCV: 93.7 fL (ref 80.0–100.0)
Monocytes Absolute: 0.3 10*3/uL (ref 0.1–1.0)
Monocytes Relative: 9 %
Neutro Abs: 2 10*3/uL (ref 1.7–7.7)
Neutrophils Relative %: 58 %
Platelets: 262 10*3/uL (ref 150–400)
RBC: 3.5 MIL/uL — ABNORMAL LOW (ref 3.87–5.11)
RDW: 13.9 % (ref 11.5–15.5)
WBC: 3.4 10*3/uL — ABNORMAL LOW (ref 4.0–10.5)
nRBC: 0 % (ref 0.0–0.2)

## 2020-04-20 LAB — BASIC METABOLIC PANEL
Anion gap: 10 (ref 5–15)
BUN: 13 mg/dL (ref 8–23)
CO2: 26 mmol/L (ref 22–32)
Calcium: 8.9 mg/dL (ref 8.9–10.3)
Chloride: 104 mmol/L (ref 98–111)
Creatinine, Ser: 0.92 mg/dL (ref 0.44–1.00)
GFR, Estimated: >60 mL/min (ref >60)
Glucose, Bld: 89 mg/dL (ref 70–99)
Potassium: 3.8 mmol/L (ref 3.5–5.1)
Sodium: 140 mmol/L (ref 135–145)

## 2020-04-21 LAB — SARS CORONAVIRUS 2 (TAT 6-24 HRS): SARS Coronavirus 2: NEGATIVE

## 2020-04-22 ENCOUNTER — Ambulatory Visit (HOSPITAL_COMMUNITY)
Admission: RE | Admit: 2020-04-22 | Discharge: 2020-04-22 | Disposition: A | Discharge status: Home / Self Care | Payer: Medicare Other | Attending: Internal Medicine | Admitting: Internal Medicine

## 2020-04-22 ENCOUNTER — Encounter (HOSPITAL_COMMUNITY)
Admission: RE | Disposition: A | Discharge status: Home / Self Care | Payer: Self-pay | Source: Home / Self Care | Attending: Internal Medicine

## 2020-04-22 ENCOUNTER — Ambulatory Visit (HOSPITAL_COMMUNITY): Payer: Medicare Other | Admitting: Anesthesiology

## 2020-04-22 ENCOUNTER — Encounter (HOSPITAL_COMMUNITY): Payer: Self-pay | Admitting: Internal Medicine

## 2020-04-22 DIAGNOSIS — Z86010 Personal history of colonic polyps: Secondary | ICD-10-CM | POA: Diagnosis not present

## 2020-04-22 DIAGNOSIS — Z09 Encounter for follow-up examination after completed treatment for conditions other than malignant neoplasm: Secondary | ICD-10-CM | POA: Diagnosis not present

## 2020-04-22 DIAGNOSIS — Z87891 Personal history of nicotine dependence: Secondary | ICD-10-CM | POA: Diagnosis not present

## 2020-04-22 DIAGNOSIS — Z1211 Encounter for screening for malignant neoplasm of colon: Secondary | ICD-10-CM | POA: Diagnosis not present

## 2020-04-22 DIAGNOSIS — Z79899 Other long term (current) drug therapy: Secondary | ICD-10-CM | POA: Insufficient documentation

## 2020-04-22 DIAGNOSIS — Z980 Intestinal bypass and anastomosis status: Secondary | ICD-10-CM | POA: Diagnosis not present

## 2020-04-22 DIAGNOSIS — Z9049 Acquired absence of other specified parts of digestive tract: Secondary | ICD-10-CM | POA: Diagnosis not present

## 2020-04-22 DIAGNOSIS — K6289 Other specified diseases of anus and rectum: Secondary | ICD-10-CM | POA: Diagnosis not present

## 2020-04-22 DIAGNOSIS — Z7982 Long term (current) use of aspirin: Secondary | ICD-10-CM | POA: Diagnosis not present

## 2020-04-22 DIAGNOSIS — K644 Residual hemorrhoidal skin tags: Secondary | ICD-10-CM | POA: Insufficient documentation

## 2020-04-22 HISTORY — PX: COLONOSCOPY WITH PROPOFOL: SHX5780

## 2020-04-22 SURGERY — COLONOSCOPY WITH PROPOFOL
Anesthesia: General

## 2020-04-22 MED ORDER — LACTATED RINGERS IV SOLN: INTRAVENOUS | Status: DC

## 2020-04-22 MED ORDER — PROPOFOL 500 MG/50ML IV EMUL
INTRAVENOUS | Status: DC | PRN
  Administered 2020-04-22: 150 ug/kg/min via INTRAVENOUS

## 2020-04-22 MED ORDER — CHLORHEXIDINE GLUCONATE CLOTH 2 % EX PADS: 6.0000 | MEDICATED_PAD | Freq: Once | CUTANEOUS | Status: DC

## 2020-04-22 MED ORDER — PROPOFOL 10 MG/ML IV BOLUS
INTRAVENOUS | Status: AC
  Filled 2020-04-22: qty 60

## 2020-04-22 MED ORDER — STERILE WATER FOR IRRIGATION IR SOLN: Status: DC | PRN

## 2020-04-22 MED ORDER — PROPOFOL 10 MG/ML IV BOLUS
INTRAVENOUS | Status: DC | PRN
  Administered 2020-04-22: 50 mg via INTRAVENOUS

## 2020-04-22 NOTE — Op Note (Signed)
[facility] Patient Name: Robin [name] Procedure Date: 04/22/2020 10:03 AM MRN: [id number] Date of Birth: 04-Oct-1940 Attending MD: Hildred Laser , MD CSN: [id number] Age: 80 Admit Type: Outpatient Procedure:                Colonoscopy Indications:              High risk colon cancer surveillance: Personal                            history of colonic polyps Providers:                Hildred Laser, MD, Lurline Del, RN, Randa Spike,                            Technician Referring MD:             Chevis Pretty, FNP Medicines:                Propofol per Anesthesia Complications:            No immediate complications. Estimated Blood Loss:     Estimated blood loss: none. Procedure:                Pre-Anesthesia Assessment:                           - Prior to the procedure, a History and Physical                            was performed, and patient medications and                            allergies were reviewed. The patient's tolerance of                            previous anesthesia was also reviewed. The risks                            and benefits of the procedure and the sedation                            options and risks were discussed with the patient.                            All questions were answered, and informed consent                            was obtained. Prior Anticoagulants: The patient has                            taken no previous anticoagulant or antiplatelet                            agents except for aspirin. ASA Grade Assessment: II                            -  A patient with mild systemic disease. After                            reviewing the risks and benefits, the patient was                            deemed in satisfactory condition to undergo the                            procedure.                           After obtaining informed consent, the colonoscope                            was passed under direct vision. Throughout  the                            procedure, the patient's blood pressure, pulse, and                            oxygen saturations were monitored continuously. The                            [id number]-[id number] ([id number]) scope was introduced through                            the anus and advanced to the the ileocolonic                            anastomosis. The colonoscopy was performed without                            difficulty. The patient tolerated the procedure                            well. The quality of the bowel preparation was                            excellent. The terminal ileum and the rectum were                            photographed. Scope In: 10:26:52 AM Scope Out: 10:39:12 AM Scope Withdrawal Time: 0 hours 7 minutes 15 seconds  Total Procedure Duration: 0 hours 12 minutes 20 seconds  Findings:      Skin tags were found on perianal exam.      The neo-terminal ileum appeared normal.      There was evidence of a prior end-to-end ileo-colonic anastomosis in the       mid transverse colon. This was patent and was characterized by healthy       appearing mucosa. The anastomosis was traversed.      The colon (entire examined portion) appeared normal.      External hemorrhoids were found during retroflexion. The hemorrhoids       were medium-sized.  Anal papilla was hypertrophied. Impression:               - Perianal skin tags found on perianal exam.                           - The examined portion of the ileum was normal.                           - Patent end-to-end ileo-colonic anastomosis,                            characterized by healthy appearing mucosa.                           - The entire examined colon is normal.                           - External hemorrhoids.                           - Small anal papilla                           - No specimens collected. Moderate Sedation:      Per Anesthesia Care Recommendation:           - Patient has a contact number  available for                            emergencies. The signs and symptoms of potential                            delayed complications were discussed with the                            patient. Return to normal activities tomorrow.                            Written discharge instructions were provided to the                            patient.                           - Resume previous diet today.                           - Continue present medications including aspirin as                            before.                           - No recommendation at this time regarding repeat                            colonoscopy.                           -  Return to GI clinic in 5 years. Procedure Code(s):        --- Professional ---                           [phone], Colonoscopy, flexible; diagnostic, including                            collection of specimen(s) by brushing or washing,                            when performed (separate procedure) Diagnosis Code(s):        --- Professional ---                           K64.4, Residual hemorrhoidal skin tags                           Z86.010, Personal history of colonic polyps                           Z98.0, Intestinal bypass and anastomosis status                           K62.89, Other specified diseases of anus and rectum CPT copyright 2019 [employer]. All rights reserved. The codes documented in this report are preliminary and upon coder review may  be revised to meet current compliance requirements. Hildred Laser, MD Hildred Laser, MD 04/22/2020 10:48:07 AM This report has been signed electronically. Number of Addenda: 0

## 2020-04-22 NOTE — Anesthesia Preprocedure Evaluation (Signed)
Anesthesia Evaluation  Patient identified by MRN, date of birth, ID band Patient awake    Reviewed: Allergy & Precautions, NPO status , Patient's Chart, lab work & pertinent test results  History of Anesthesia Complications Negative for: history of anesthetic complications  Airway Mallampati: II  TM Distance: >3 FB Neck ROM: Full    Dental  (+) Edentulous Upper, Edentulous Lower   Pulmonary former smoker,    Pulmonary exam normal breath sounds clear to auscultation       Cardiovascular Exercise Tolerance: Good hypertension, Pt. on medications Normal cardiovascular exam Rhythm:Regular Rate:Normal     Neuro/Psych PSYCHIATRIC DISORDERS Anxiety    GI/Hepatic negative GI ROS, Neg liver ROS,   Endo/Other  negative endocrine ROS  Renal/GU negative Renal ROS     Musculoskeletal negative musculoskeletal ROS (+)   Abdominal   Peds  Hematology negative hematology ROS (+)   Anesthesia Other Findings   Reproductive/Obstetrics negative OB ROS                             Anesthesia Physical Anesthesia Plan  ASA: II  Anesthesia Plan: General   Post-op Pain Management:    Induction: Intravenous  PONV Risk Score and Plan: TIVA  Airway Management Planned: Nasal Cannula and Natural Airway  Additional Equipment:   Intra-op Plan:   Post-operative Plan:   Informed Consent: I have reviewed the patients History and Physical, chart, labs and discussed the procedure including the risks, benefits and alternatives for the proposed anesthesia with the patient or authorized representative who has indicated his/her understanding and acceptance.       Plan Discussed with: CRNA and Surgeon  Anesthesia Plan Comments:         Anesthesia Quick Evaluation

## 2020-04-22 NOTE — Discharge Instructions (Signed)
Resume usual medications including aspirin as before Resume usual diet No driving for 24 hours Office visit in 5 years.   Colonoscopy, Adult, Care After This sheet gives you information about how to care for yourself after your procedure. Your doctor may also give you more specific instructions. If you have problems or questions, call your doctor. What can I expect after the procedure? After the procedure, it is common to have:  A small amount of blood in your poop (stool) for 24 hours.  Some gas.  Mild cramping or bloating in your belly (abdomen). Follow these instructions at home: Eating and drinking  Drink enough fluid to keep your pee (urine) pale yellow.  Follow instructions from your doctor about what you cannot eat or drink.  Return to your normal diet as told by your doctor. Avoid heavy or fried foods that are hard to digest.   Activity  Rest as told by your doctor.  Do not sit for a long time without moving. Get up to take short walks every 1-2 hours. This is important. Ask for help if you feel weak or unsteady.  Return to your normal activities as told by your doctor. Ask your doctor what activities are safe for you. To help cramping and bloating:  Try walking around.  Put heat on your belly as told by your doctor. Use the heat source that your doctor recommends, such as a moist heat pack or a heating pad. ? Put a towel between your skin and the heat source. ? Leave the heat on for 20-30 minutes. ? Remove the heat if your skin turns bright red. This is very important if you are unable to feel pain, heat, or cold. You may have a greater risk of getting burned.   General instructions  If you were given a medicine to help you relax (sedative) during your procedure, it can affect you for many hours. Do not drive or use machinery until your doctor says that it is safe.  For the first 24 hours after the procedure: ? Do not sign important documents. ? Do not drink  alcohol. ? Do your daily activities more slowly than normal. ? Eat foods that are soft and easy to digest.  Take over-the-counter or prescription medicines only as told by your doctor.  Keep all follow-up visits as told by your doctor. This is important. Contact a doctor if:  You have blood in your poop 2-3 days after the procedure. Get help right away if:  You have more than a small amount of blood in your poop.  You see large clumps of tissue (blood clots) in your poop.  Your belly is swollen.  You feel like you may vomit (nauseous).  You vomit.  You have a fever.  You have belly pain that gets worse, and medicine does not help your pain. Summary  After the procedure, it is common to have a small amount of blood in your poop. You may also have mild cramping and bloating in your belly.  If you were given a medicine to help you relax (sedative) during your procedure, it can affect you for many hours. Do not drive or use machinery until your doctor says that it is safe.  Get help right away if you have a lot of blood in your poop, feel like you may vomit, have a fever, or have more belly pain. This information is not intended to replace advice given to you by your health care provider. Make sure  you discuss any questions you have with your health care provider. Document Revised: 12/14/2018 Document Reviewed: 09/03/2018 Elsevier Patient Education  2021 [place].   Hemorrhoids Hemorrhoids are swollen veins that may develop:  In the butt (rectum). These are called internal hemorrhoids.  Around the opening of the butt (anus). These are called external hemorrhoids. Hemorrhoids can cause pain, itching, or bleeding. Most of the time, they do not cause serious problems. They usually get better with diet changes, lifestyle changes, and other home treatments. What are the causes? This condition may be caused by:  Having trouble pooping (constipation).  Pushing hard  (straining) to poop.  Watery poop (diarrhea).  Pregnancy.  Being very overweight (obese).  Sitting for long periods of time.  Heavy lifting or other activity that causes you to strain.  Anal sex.  Riding a bike for a long period of time. What are the signs or symptoms? Symptoms of this condition include:  Pain.  Itching or soreness in the butt.  Bleeding from the butt.  Leaking poop.  Swelling in the area.  One or more lumps around the opening of your butt. How is this diagnosed? A doctor can often diagnose this condition by looking at the affected area. The doctor may also:  Do an exam that involves feeling the area with a gloved hand (digital rectal exam).  Examine the area inside your butt using a small tube (anoscope).  Order blood tests. This may be done if you have lost a lot of blood.  Have you get a test that involves looking inside the colon using a flexible tube with a camera on the end (sigmoidoscopy or colonoscopy). How is this treated? This condition can usually be treated at home. Your doctor may tell you to change what you eat, make lifestyle changes, or try home treatments. If these do not help, procedures can be done to remove the hemorrhoids or make them smaller. These may involve:  Placing rubber bands at the base of the hemorrhoids to cut off their blood supply.  Injecting medicine into the hemorrhoids to shrink them.  Shining a type of light energy onto the hemorrhoids to cause them to fall off.  Doing surgery to remove the hemorrhoids or cut off their blood supply. Follow these instructions at home: Eating and drinking  Eat foods that have a lot of fiber in them. These include whole grains, beans, nuts, fruits, and vegetables.  Ask your doctor about taking products that have added fiber (fibersupplements).  Reduce the amount of fat in your diet. You can do this by: ? Eating low-fat dairy products. ? Eating less red meat. ? Avoiding  processed foods.  Drink enough fluid to keep your pee (urine) pale yellow.   Managing pain and swelling  Take a warm-water bath (sitz bath) for 20 minutes to ease pain. Do this 3-4 times a day. You may do this in a bathtub or using a portable sitz bath that fits over the toilet.  If told, put ice on the painful area. It may be helpful to use ice between your warm baths. ? Put ice in a plastic bag. ? Place a towel between your skin and the bag. ? Leave the ice on for 20 minutes, 2-3 times a day.   General instructions  Take over-the-counter and prescription medicines only as told by your doctor. ? Medicated creams and medicines may be used as told.  Exercise often. Ask your doctor how much and what kind of exercise is  best for you.  Go to the bathroom when you have the urge to poop. Do not wait.  Avoid pushing too hard when you poop.  Keep your butt dry and clean. Use wet toilet paper or moist towelettes after pooping.  Do not sit on the toilet for a long time.  Keep all follow-up visits as told by your doctor. This is important. Contact a doctor if you:  Have pain and swelling that do not get better with treatment or medicine.  Have trouble pooping.  Cannot poop.  Have pain or swelling outside the area of the hemorrhoids. Get help right away if you have:  Bleeding that will not stop. Summary  Hemorrhoids are swollen veins in the butt or around the opening of the butt.  They can cause pain, itching, or bleeding.  Eat foods that have a lot of fiber in them. These include whole grains, beans, nuts, fruits, and vegetables.  Take a warm-water bath (sitz bath) for 20 minutes to ease pain. Do this 3-4 times a day. This information is not intended to replace advice given to you by your health care provider. Make sure you discuss any questions you have with your health care provider. Document Revised: 02/15/2018 Document Reviewed: 06/29/2017 Elsevier Patient Education  2021  [place] POST-ANESTHESIA  IMMEDIATELY FOLLOWING SURGERY:  Do not drive or operate machinery for the first twenty four hours after surgery.  Do not make any important decisions for twenty four hours after surgery or while taking narcotic pain medications or sedatives.  If you develop intractable nausea and vomiting or a severe headache please notify your doctor immediately.  FOLLOW-UP:  Please make an appointment with your surgeon as instructed. You do not need to follow up with anesthesia unless specifically instructed to do so.  WOUND CARE INSTRUCTIONS (if applicable):  Keep a dry clean dressing on the anesthesia/puncture wound site if there is drainage.  Once the wound has quit draining you may leave it open to air.  Generally you should leave the bandage intact for twenty four hours unless there is drainage.  If the epidural site drains for more than 36-48 hours please call the anesthesia department.  QUESTIONS?:  Please feel free to call your physician or the hospital operator if you have any questions, and they will be happy to assist you.

## 2020-04-22 NOTE — H&P (Addendum)
Robin [name] is an 80 y.o. female.   Chief Complaint: Patient is here for colonoscopy HPI: Patient is 80 year old Caucasian female who is here for surveillance colonoscopy.  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  She had another surgery to remove polyp at anastomosis in 2008.  Most of her transverse colon was removed at the surgery her last colonoscopy was in May 2016 and no polyps were found.  She denies abdominal pain change in bowel habits or rectal bleeding.  Family history is negative for CRC.  Past Medical History:  Diagnosis Date  . 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 Atrial fibrillation ([place])   . Cataract   . Hyperlipidemia   . Hypertension   . Osteoporosis    history of femur fracture  . Shingles   . Vitamin D deficiency     Past Surgical History:  Procedure Laterality Date  . 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 COLONOSCOPY  April 2007   Dr. Sharlett Iles: 5cm circumferential fungating tumor at the hepatic flexure, path tubulovillous adenoma.   . COLONOSCOPY  Jan 02, 2006   Dr. 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 Multiple polyps around anastomosis of different sizes,   . COLONOSCOPY  2008   Dr. Sharlett Iles. Referred to Dr. Georgette Dover for resection  . COLONOSCOPY N/A 07/18/2014   Procedure: COLONOSCOPY;  Surgeon: Daneil Dolin, MD;  Location: [facility];  Service: Endoscopy;  Laterality: N/A;  1230  . EYE SURGERY    . LAPAROSCOPIC LYSIS OF ADHESIONS    . PARTIAL COLECTOMY  2007   due to tubulovillous adenoma  . TUBAL LIGATION      Family History  Problem Relation Age of Onset  . Diabetes Mother   . Heart disease Mother   . Cancer Mother        patient unsure of type  . Hip fracture Mother   . Heart disease Father        antigioplasty  . Cancer Sister        lung  . COPD  Sister   . Heart disease Sister   . Atrial fibrillation Sister   . Heart disease Sister        stent in left leg  . Varicose Veins Sister   . Deep vein thrombosis Sister   . Heart disease Brother   . COPD Brother   . COPD Brother   . Huntington's disease Daughter   . Carpal tunnel syndrome Daughter   . Brain cancer Son   . Colon cancer Neg Hx    Social History:  reports that she quit smoking about 60 years ago. Her smoking use included cigarettes. She quit after 1.00 year of use. She has never used smokeless tobacco. She reports that she does not drink alcohol and does not use drugs.  Allergies: No Known Allergies  Medications Prior to Admission  Medication Sig Dispense Refill  . aspirin EC 81 MG tablet Take 81 mg by mouth daily.    Marland Kitchen atorvastatin (LIPITOR) 80 MG tablet Take 1 tablet by mouth once daily (Patient taking differently: Take 80 mg by mouth daily.) 90 tablet 0  . Cholecalciferol (VITAMIN D) 2000 UNITS tablet Take 4000IU on saturdays and sundays and 2000IU all other days. (Patient taking differently: Take 2,000 Units by mouth See admin instructions. Take 1 tablet (2000 units)  on Mondays through Fridays and take 2 tablets (4000 units) by mouth on Saturdays & Sundays.)    . denosumab (PROLIA) 60 MG/ML SOSY injection BRING TO OFFICE FOR ADMINISTRATION. ADMINISTER IN UPPER ARM, THIGH OR ABDOMEN 1 mL 0  . ENSURE PLUS (ENSURE PLUS) LIQD Take 1 Can by mouth daily. Dx:  R63.6 (underweight) 7110 mL 2  . escitalopram (LEXAPRO) 10 MG tablet Take 1 tablet (10 mg total) by mouth daily. 90 tablet 1  . lisinopril (ZESTRIL) 10 MG tablet Take 1 tablet (10 mg total) by mouth daily. 90 tablet 1  . polyethylene glycol-electrolytes (NULYTELY) 420 g solution Take 4,000 mLs by mouth as directed.      Results for orders placed or performed during the hospital encounter of 04/20/20 (from the past 48 hour(s))  SARS CORONAVIRUS 2 (TAT 6-24 HRS) Nasopharyngeal Nasopharyngeal Swab     Status: None    Collection Time: 04/20/20  1:55 PM   Specimen: Nasopharyngeal Swab  Result Value Ref Range   SARS Coronavirus 2 NEGATIVE NEGATIVE    Comment: (NOTE) SARS-CoV-2 target nucleic acids are NOT DETECTED.  The SARS-CoV-2 RNA is generally detectable in upper and lower respiratory specimens during the acute phase of infection. Negative results do not preclude SARS-CoV-2 infection, do not rule out co-infections with other pathogens, and should not be used as the sole basis for treatment or other patient management decisions. Negative results must be combined with clinical observations, patient history, and epidemiological information. The expected result is Negative.  Fact Sheet for Patients: SugarRoll.be  Fact Sheet for Healthcare Providers: https://www.woods-mathews.com/  This test is not yet approved or cleared by the [place] FDA and  has been authorized for detection and/or diagnosis of SARS-CoV-2 by FDA under an Emergency Use Authorization (EUA). This EUA will remain  in effect (meaning this test can be used) for the duration of the COVID-19 declaration under Se ction 564(b)(1) of the Act, 21 U.S.C. section 360bbb-3(b)(1), unless the authorization is terminated or revoked sooner.  Performed at [facility], [place] [address], [place], [place] [address]   CBC with Differential/Platelet     Status: Abnormal   Collection Time: 04/20/20  2:07 PM  Result Value Ref Range   WBC 3.4 (L) 4.0 - 10.5 K/uL   RBC 3.50 (L) 3.87 - 5.11 MIL/uL   Hemoglobin 10.6 (L) 12.0 - 15.0 g/dL   HCT 32.8 (L) 36.0 - 46.0 %   MCV 93.7 80.0 - 100.0 fL   MCH 30.3 26.0 - 34.0 pg   MCHC 32.3 30.0 - 36.0 g/dL   RDW 13.9 11.5 - 15.5 %   Platelets 262 150 - 400 K/uL   nRBC 0.0 0.0 - 0.2 %   Neutrophils Relative % 58 %   Neutro Abs 2.0 1.7 - 7.7 K/uL   Lymphocytes Relative 31 %   Lymphs Abs 1.0 0.7 - 4.0 K/uL   Monocytes Relative 9 %   Monocytes Absolute 0.3  0.1 - 1.0 K/uL   Eosinophils Relative 1 %   Eosinophils Absolute 0.0 0.0 - 0.5 K/uL   Basophils Relative 1 %   Basophils Absolute 0.0 0.0 - 0.1 K/uL   Immature Granulocytes 0 %   Abs Immature Granulocytes 0.01 0.00 - 0.07 K/uL    Comment: Performed at [facility], [address] [place], [place] [address]  Basic metabolic panel     Status: None   Collection Time: 04/20/20  2:07 PM  Result Value Ref Range   Sodium 140  135 - 145 mmol/L   Potassium 3.8 3.5 - 5.1 mmol/L   Chloride 104 98 - 111 mmol/L   CO2 26 22 - 32 mmol/L   Glucose, Bld 89 70 - 99 mg/dL    Comment: Glucose reference range applies only to samples taken after fasting for at least 8 hours.   BUN 13 8 - 23 mg/dL   Creatinine, Ser 0.92 0.44 - 1.00 mg/dL   Calcium 8.9 8.9 - 10.3 mg/dL   GFR, Estimated >60 >60 mL/min    Comment: (NOTE) Calculated using the CKD-EPI Creatinine Equation (2021)    Anion gap 10 5 - 15    Comment: Performed at [facility], [address] [place],  [address]   No results found.  Review of Systems  Blood pressure (!) 171/82, pulse (!) 59, temperature 98.1 F (36.7 C), temperature source Oral, resp. rate 17, height 5\' 1"  (1.549 m), weight 45 kg, SpO2 99 %. Physical Exam Constitutional:      Comments: Thin Caucasian female who is in no acute distress  HENT:     Mouth/Throat:     Mouth: Mucous membranes are moist.     Pharynx: Oropharynx is clear.  Eyes:     General: No scleral icterus.    Conjunctiva/sclera: Conjunctivae normal.  Cardiovascular:     Rate and Rhythm: Normal rate and regular rhythm.     Heart sounds: Normal heart sounds. No murmur heard.   Pulmonary:     Effort: Pulmonary effort is normal.     Breath sounds: Normal breath sounds.  Abdominal:     General: There is no distension.     Palpations: Abdomen is soft. There is no mass.     Tenderness: There is no abdominal tenderness.     Comments: Midline scar noted  Musculoskeletal:        General: No swelling.      Cervical back: Neck supple.  Lymphadenopathy:     Cervical: No cervical adenopathy.  Skin:    General: Skin is warm and dry.      Assessment/Plan  History of colonic adenomas. Surveillance colonoscopy.  Hildred Laser, MD 04/22/2020, 10:13 AM

## 2020-04-22 NOTE — Transfer of Care (Signed)
Immediate Anesthesia Transfer of Care Note  Patient: Robin [name]  Procedure(s) Performed: COLONOSCOPY WITH PROPOFOL (N/A )  Patient Location: Short Stay  Anesthesia Type:General  Level of Consciousness: awake  Airway & Oxygen Therapy: Patient Spontanous Breathing  Post-op Assessment: Report given to RN  Post vital signs: Reviewed and stable  Last Vitals:  Vitals Value Taken Time  BP    Temp    Pulse    Resp    SpO2      Last Pain:  Vitals:   04/22/20 1052  TempSrc: Oral  PainSc: 0-No pain      Patients Stated Pain Goal: 9 (28/76/81 1572)  Complications: No complications documented.

## 2020-04-22 NOTE — Anesthesia Postprocedure Evaluation (Signed)
Anesthesia Post Note  Patient: Robin [name]  Procedure(s) Performed: COLONOSCOPY WITH PROPOFOL (N/A )  Patient location during evaluation: Short Stay Anesthesia Type: General Level of consciousness: awake and alert Pain management: pain level controlled Vital Signs Assessment: post-procedure vital signs reviewed and stable Respiratory status: spontaneous breathing Cardiovascular status: blood pressure returned to baseline and stable Postop Assessment: no apparent nausea or vomiting Anesthetic complications: no   No complications documented.   Last Vitals:  Vitals:   04/22/20 0946 04/22/20 1052  BP: (!) 171/82 111/70  Pulse: (!) 59 (!) 55  Resp: 17   Temp: 36.7 C 36.7 C  SpO2: 99% 99%    Last Pain:  Vitals:   04/22/20 1052  TempSrc: Oral  PainSc: 0-No pain                 Kathern Lobosco

## 2020-04-28 ENCOUNTER — Encounter (HOSPITAL_COMMUNITY): Payer: Self-pay | Admitting: Internal Medicine

## 2020-06-18 ENCOUNTER — Encounter: Payer: Self-pay | Admitting: Nurse Practitioner

## 2020-06-18 ENCOUNTER — Other Ambulatory Visit: Payer: Self-pay

## 2020-06-18 ENCOUNTER — Ambulatory Visit (INDEPENDENT_AMBULATORY_CARE_PROVIDER_SITE_OTHER): Payer: Medicare Other | Admitting: Nurse Practitioner

## 2020-06-18 VITALS — BP 141/86 | HR 62 | Temp 97.3°F | Resp 20 | Ht 61.0 in | Wt 95.0 lb

## 2020-06-18 DIAGNOSIS — I10 Essential (primary) hypertension: Secondary | ICD-10-CM | POA: Diagnosis not present

## 2020-06-18 DIAGNOSIS — E7800 Pure hypercholesterolemia, unspecified: Secondary | ICD-10-CM | POA: Diagnosis not present

## 2020-06-18 DIAGNOSIS — E559 Vitamin D deficiency, unspecified: Secondary | ICD-10-CM

## 2020-06-18 DIAGNOSIS — F411 Generalized anxiety disorder: Secondary | ICD-10-CM

## 2020-06-18 DIAGNOSIS — R636 Underweight: Secondary | ICD-10-CM | POA: Diagnosis not present

## 2020-06-18 DIAGNOSIS — M810 Age-related osteoporosis without current pathological fracture: Secondary | ICD-10-CM | POA: Diagnosis not present

## 2020-06-18 MED ORDER — LISINOPRIL 10 MG PO TABS: 10.0000 mg | ORAL_TABLET | Freq: Every day | ORAL | 1 refills | Status: DC

## 2020-06-18 MED ORDER — ATORVASTATIN CALCIUM 80 MG PO TABS: 80.0000 mg | ORAL_TABLET | Freq: Every day | ORAL | 1 refills | Status: DC

## 2020-06-18 MED ORDER — ESCITALOPRAM OXALATE 10 MG PO TABS: 10.0000 mg | ORAL_TABLET | Freq: Every day | ORAL | 1 refills | Status: DC

## 2020-06-18 NOTE — Patient Instructions (Signed)

## 2020-06-18 NOTE — Progress Notes (Signed)
Subjective:    Patient ID: [name], female    DOB: February 24, 1940, 80 y.o.   MRN: [id number]   Chief Complaint: Medical Management of Chronic Issues    HPI:  1. Essential hypertension No c/o chest pain, sob or headache. Does not check blood pressure at home. BP Readings from Last 3 Encounters:  06/18/20 (!) 141/86  04/22/20 111/70  04/20/20 (!) 143/93     2. Pure hypercholesterolemia She says she eats 3 meals a day and snacks. Lab Results  Component Value Date   CHOL 118 05/16/2019   HDL 53 05/16/2019   LDLCALC 51 05/16/2019   TRIG 63 05/16/2019   CHOLHDL 2.2 05/16/2019      3. GAD (generalized anxiety disorder) Is on lexapro and is working well for her. GAD 7 : Generalized Anxiety Score 11/18/2019 05/16/2019 12/19/2017  Nervous, Anxious, on Edge 0 0 3  Control/stop worrying 0 0 2  Worry too much - different things 0 0 0  Trouble relaxing 0 0 0  Restless 0 0 0  Easily annoyed or irritable 0 0 2  Afraid - awful might happen 0 0 0  Total GAD 7 Score 0 0 7  Anxiety Difficulty Not difficult at all Not difficult at all Not difficult at all      4. Age related osteoporosis, unspecified pathological fracture presence She is walking daily for exercise. Last dexascan was done 02/04/19 with t score of -3.3. she is on prolia.  5. Vitamin D insufficiency Takes vitamin d daily  6. Underweight Weight is down 4lbs. Wt Readings from Last 3 Encounters:  06/18/20 95 lb (43.1 kg)  04/22/20 99 lb 3.3 oz (45 kg)  04/20/20 99 lb (44.9 kg)   BMI Readings from Last 3 Encounters:  06/18/20 17.95 kg/m  04/22/20 18.74 kg/m  04/20/20 18.71 kg/m       Outpatient Encounter Medications as of 06/18/2020  Medication Sig  . aspirin EC 81 MG tablet Take 81 mg by mouth daily.  Marland Kitchen atorvastatin (LIPITOR) 80 MG tablet Take 1 tablet by mouth once daily (Patient taking differently: Take 80 mg by mouth daily.)  . Cholecalciferol (VITAMIN D) 2000 UNITS tablet Take 4000IU on  saturdays and sundays and 2000IU all other days. (Patient taking differently: Take 2,000 Units by mouth See admin instructions. Take 1 tablet (2000 units) on Mondays through Fridays and take 2 tablets (4000 units) by mouth on Saturdays & Sundays.)  . denosumab (PROLIA) 60 MG/ML SOSY injection BRING TO OFFICE FOR ADMINISTRATION. ADMINISTER IN UPPER ARM, THIGH OR ABDOMEN  . ENSURE PLUS (ENSURE PLUS) LIQD Take 1 Can by mouth daily. Dx:  R63.6 (underweight)  . escitalopram (LEXAPRO) 10 MG tablet Take 1 tablet (10 mg total) by mouth daily.  Marland Kitchen lisinopril (ZESTRIL) 10 MG tablet Take 1 tablet (10 mg total) by mouth daily.  . [DISCONTINUED] polyethylene glycol-electrolytes (NULYTELY) 420 g solution Take 4,000 mLs by mouth as directed.   No facility-administered encounter medications on file as of 06/18/2020.    Past Surgical History:  Procedure Laterality Date  . 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 COLONOSCOPY  April 2007   Dr. Sharlett Iles: 5cm circumferential fungating tumor at the hepatic flexure, path tubulovillous adenoma.   . COLONOSCOPY  Jan 02, 2006   Dr. 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 Multiple polyps around anastomosis of different sizes,   . COLONOSCOPY  2008   Dr. Sharlett Iles.  Referred to Dr. Georgette Dover for resection  . COLONOSCOPY N/A 07/18/2014   Procedure: COLONOSCOPY;  Surgeon: Daneil Dolin, MD;  Location: [facility];  Service: Endoscopy;  Laterality: N/A;  1230  . COLONOSCOPY WITH PROPOFOL N/A 04/22/2020   Procedure: COLONOSCOPY WITH PROPOFOL;  Surgeon: Rogene Houston, MD;  Location: [facility];  Service: Endoscopy;  Laterality: N/A;  am  . EYE SURGERY    . LAPAROSCOPIC LYSIS OF ADHESIONS    . PARTIAL COLECTOMY  2007   due to tubulovillous adenoma  . TUBAL LIGATION      Family History  Problem Relation Age of Onset  . Diabetes Mother   . Heart disease Mother    . Cancer Mother        patient unsure of type  . Hip fracture Mother   . Heart disease Father        antigioplasty  . Cancer Sister        lung  . COPD Sister   . Heart disease Sister   . Atrial fibrillation Sister   . Heart disease Sister        stent in left leg  . Varicose Veins Sister   . Deep vein thrombosis Sister   . Heart disease Brother   . COPD Brother   . COPD Brother   . Huntington's disease Daughter   . Carpal tunnel syndrome Daughter   . Brain cancer Son   . Colon cancer Neg Hx     New complaints: None today  Social history: Her son lives with her  Controlled substance contract: n/a    Review of Systems  Constitutional: Negative for diaphoresis.  Eyes: Negative for pain.  Respiratory: Negative for shortness of breath.   Cardiovascular: Negative for chest pain, palpitations and leg swelling.  Gastrointestinal: Negative for abdominal pain.  Endocrine: Negative for polydipsia.  Skin: Negative for rash.  Neurological: Negative for dizziness, weakness and headaches.  Hematological: Does not bruise/bleed easily.  All other systems reviewed and are negative.      Objective:   Physical Exam Vitals and nursing note reviewed.  Constitutional:      General: She is not in acute distress.    Appearance: Normal appearance. She is well-developed.  HENT:     Head: Normocephalic.     Nose: Nose normal.  Eyes:     Pupils: Pupils are equal, round, and reactive to light.  Neck:     Vascular: No carotid bruit or JVD.  Cardiovascular:     Rate and Rhythm: Normal rate and regular rhythm.     Heart sounds: Normal heart sounds.  Pulmonary:     Effort: Pulmonary effort is normal. No respiratory distress.     Breath sounds: Normal breath sounds. No wheezing or rales.  Chest:     Chest wall: No tenderness.  Abdominal:     General: Bowel sounds are normal. There is no distension or abdominal bruit.     Palpations: Abdomen is soft. There is no hepatomegaly,  splenomegaly, mass or pulsatile mass.     Tenderness: There is no abdominal tenderness.  Musculoskeletal:        General: Normal range of motion.     Cervical back: Normal range of motion and neck supple.  Lymphadenopathy:     Cervical: No cervical adenopathy.  Skin:    General: Skin is warm and dry.  Neurological:     Mental Status: She is alert and oriented to person, place, and time.  Deep Tendon Reflexes: Reflexes are normal and symmetric.  Psychiatric:        Behavior: Behavior normal.        Thought Content: Thought content normal.        Judgment: Judgment normal.     BP (!) 141/86   Pulse 62   Temp (!) 97.3 F (36.3 C) (Temporal)   Resp 20   Ht _0  (1.549 m)   Wt 95 lb (43.1 kg)   SpO2 97%   BMI 17.95 kg/m        Assessment & Plan:  STAYSHA TRUBY comes in today with chief complaint of Medical Management of Chronic Issues   Diagnosis and orders addressed:  1. Essential hypertension Low sodium diet - lisinopril (ZESTRIL) 10 MG tablet; Take 1 tablet (10 mg total) by mouth daily.  Dispense: 90 tablet; Refill: 1 - CBC with Differential/Platelet - CMP14+EGFR  2. Pure hypercholesterolemia Low fat diet - atorvastatin (LIPITOR) 80 MG tablet; Take 1 tablet (80 mg total) by mouth daily.  Dispense: 90 tablet; Refill: 1 - Lipid panel  3. GAD (generalized anxiety disorder) Stress management - escitalopram (LEXAPRO) 10 MG tablet; Take 1 tablet (10 mg total) by mouth daily.  Dispense: 90 tablet; Refill: 1  4. Age related osteoporosis, unspecified pathological fracture presence Weight bearing exercises prolia due August 04, 2020  5. Vitamin D insufficiency continue daily vitamin d supplement  6. Underweight continue daily ensure along with 3 meals a day   Labs pending Health Maintenance reviewed Diet and exercise encouraged  Follow up plan: 6 months   [place], FNP

## 2020-06-19 LAB — CBC WITH DIFFERENTIAL/PLATELET
Basophils Absolute: 0 10*3/uL (ref 0.0–0.2)
Basos: 1 %
EOS (ABSOLUTE): 0.1 10*3/uL (ref 0.0–0.4)
Eos: 2 %
Hematocrit: 34.9 % (ref 34.0–46.6)
Hemoglobin: 11.1 g/dL (ref 11.1–15.9)
Immature Grans (Abs): 0 10*3/uL (ref 0.0–0.1)
Immature Granulocytes: 0 %
Lymphocytes Absolute: 1.5 10*3/uL (ref 0.7–3.1)
Lymphs: 32 %
MCH: 28.7 pg (ref 26.6–33.0)
MCHC: 31.8 g/dL (ref 31.5–35.7)
MCV: 90 fL (ref 79–97)
Monocytes Absolute: 0.4 10*3/uL (ref 0.1–0.9)
Monocytes: 9 %
Neutrophils Absolute: 2.7 10*3/uL (ref 1.4–7.0)
Neutrophils: 56 %
Platelets: 283 10*3/uL (ref 150–450)
RBC: 3.87 x10E6/uL (ref 3.77–5.28)
RDW: 12.9 % (ref 11.7–15.4)
WBC: 4.9 10*3/uL (ref 3.4–10.8)

## 2020-06-19 LAB — CMP14+EGFR
ALT: 6 IU/L (ref 0–32)
AST: 15 IU/L (ref 0–40)
Albumin/Globulin Ratio: 2 (ref 1.2–2.2)
Albumin: 4.3 g/dL (ref 3.7–4.7)
Alkaline Phosphatase: 56 IU/L (ref 44–121)
BUN/Creatinine Ratio: 13 (ref 12–28)
BUN: 14 mg/dL (ref 8–27)
Bilirubin Total: 0.6 mg/dL (ref 0.0–1.2)
CO2: 25 mmol/L (ref 20–29)
Calcium: 9.5 mg/dL (ref 8.7–10.3)
Chloride: 103 mmol/L (ref 96–106)
Creatinine, Ser: 1.05 mg/dL — ABNORMAL HIGH (ref 0.57–1.00)
Globulin, Total: 2.2 g/dL (ref 1.5–4.5)
Glucose: 85 mg/dL (ref 65–99)
Potassium: 4.1 mmol/L (ref 3.5–5.2)
Sodium: 141 mmol/L (ref 134–144)
Total Protein: 6.5 g/dL (ref 6.0–8.5)
eGFR: 54 mL/min/{1.73_m2} — ABNORMAL LOW (ref >59)

## 2020-06-19 LAB — LIPID PANEL
Chol/HDL Ratio: 4 ratio (ref 0.0–4.4)
Cholesterol, Total: 218 mg/dL — ABNORMAL HIGH (ref 100–199)
HDL: 55 mg/dL (ref >39)
LDL Chol Calc (NIH): 149 mg/dL — ABNORMAL HIGH (ref 0–99)
Triglycerides: 81 mg/dL (ref 0–149)
VLDL Cholesterol Cal: 14 mg/dL (ref 5–40)

## 2020-07-17 DIAGNOSIS — H5213 Myopia, bilateral: Secondary | ICD-10-CM | POA: Diagnosis not present

## 2020-08-12 DIAGNOSIS — H905 Unspecified sensorineural hearing loss: Secondary | ICD-10-CM | POA: Diagnosis not present

## 2020-08-25 DIAGNOSIS — H52223 Regular astigmatism, bilateral: Secondary | ICD-10-CM | POA: Diagnosis not present

## 2020-08-25 DIAGNOSIS — H524 Presbyopia: Secondary | ICD-10-CM | POA: Diagnosis not present

## 2020-09-14 ENCOUNTER — Telehealth: Payer: Self-pay | Admitting: *Deleted

## 2020-09-14 NOTE — Telephone Encounter (Signed)
I have attempted to call patient and verify what pharmacy she would like [place] to send her prolia into. Please verify patients phone number.

## 2020-09-16 ENCOUNTER — Telehealth: Payer: Self-pay | Admitting: Nurse Practitioner

## 2020-09-21 ENCOUNTER — Telehealth: Payer: Self-pay | Admitting: Nurse Practitioner

## 2020-09-21 NOTE — Telephone Encounter (Signed)
Please call patient to schedule Dexa.

## 2020-09-24 NOTE — Telephone Encounter (Signed)
Patient is not due for dxa until dec 2022 - I have called patient and alternate contact in chart both number come up as disconnected

## 2020-10-05 ENCOUNTER — Other Ambulatory Visit: Payer: Self-pay

## 2020-10-05 ENCOUNTER — Telehealth: Payer: Self-pay | Admitting: Nurse Practitioner

## 2020-10-05 MED ORDER — PROLIA 60 MG/ML ~~LOC~~ SOSY: PREFILLED_SYRINGE | SUBCUTANEOUS | 0 refills | Status: DC

## 2020-10-08 ENCOUNTER — Other Ambulatory Visit: Payer: Self-pay

## 2020-10-08 ENCOUNTER — Ambulatory Visit (INDEPENDENT_AMBULATORY_CARE_PROVIDER_SITE_OTHER): Payer: Medicare Other

## 2020-10-08 DIAGNOSIS — M810 Age-related osteoporosis without current pathological fracture: Secondary | ICD-10-CM | POA: Diagnosis not present

## 2020-10-08 MED ORDER — DENOSUMAB 60 MG/ML ~~LOC~~ SOSY
60.0000 mg | PREFILLED_SYRINGE | Freq: Once | SUBCUTANEOUS | Status: AC
  Administered 2020-10-08: 60 mg via SUBCUTANEOUS

## 2020-10-08 NOTE — Progress Notes (Signed)
Prolia injection given to left deltoid per patient request.  Patient tolerated well.

## 2020-12-01 ENCOUNTER — Ambulatory Visit (INDEPENDENT_AMBULATORY_CARE_PROVIDER_SITE_OTHER): Payer: Medicare Other

## 2020-12-01 VITALS — Ht 61.0 in | Wt 98.0 lb

## 2020-12-01 DIAGNOSIS — Z0000 Encounter for general adult medical examination without abnormal findings: Secondary | ICD-10-CM

## 2020-12-01 NOTE — Patient Instructions (Signed)
Ms. Robin [name] , Thank you for taking time to come for your Medicare Wellness Visit. I appreciate your ongoing commitment to your health goals. Please review the following plan we discussed and let me know if I can assist you in the future.   Screening recommendations/referrals: Colonoscopy: Done 04/22/2020 - no repeat required Mammogram: Done 02/16/2018 - Repeat annually *ordered today Bone Density: done 02/04/2019 - Repeat every 2 years  Recommended yearly ophthalmology/optometry visit for glaucoma screening and checkup Recommended yearly dental visit for hygiene and checkup  Vaccinations: Influenza vaccine: Done  Pneumococcal vaccine: Done 05/30/2014 & 12/05/2012 Tdap vaccine: Done 05/30/2014 - Repeat in 10 years  Shingles vaccine: Zostavax done 2016 - Shingrix discussed. Please contact your pharmacy for coverage information.     Covid-19: Done 04/18/2019, 05/17/2019, & 01/11/2020  Advanced directives: in chart  Conditions/risks identified: Aim for 30 minutes of exercise or brisk walking each day, drink 6-8 glasses of water and eat lots of fruits and vegetables.   Next appointment: Follow up in one year for your annual wellness visit    Preventive Care 65 Years and Older, Female Preventive care refers to lifestyle choices and visits with your health care provider that can promote health and wellness. What does preventive care include? A yearly physical exam. This is also called an annual well check. Dental exams once or twice a year. Routine eye exams. Ask your health care provider how often you should have your eyes checked. Personal lifestyle choices, including: Daily care of your teeth and gums. Regular physical activity. Eating a healthy diet. Avoiding tobacco and drug use. Limiting alcohol use. Practicing safe sex. Taking low-dose aspirin every day. Taking vitamin and mineral supplements as recommended by your health care provider. What happens during an annual well check? The  services and screenings done by your health care provider during your annual well check will depend on your age, overall health, lifestyle risk factors, and family history of disease. Counseling  Your health care provider may ask you questions about your: Alcohol use. Tobacco use. Drug use. Emotional well-being. Home and relationship well-being. Sexual activity. Eating habits. History of falls. Memory and ability to understand (cognition). Work and work Statistician. Reproductive health. Screening  You may have the following tests or measurements: Height, weight, and BMI. Blood pressure. Lipid and cholesterol levels. These may be checked every 5 years, or more frequently if you are over 88 years old. Skin check. Lung cancer screening. You may have this screening every year starting at age 19 if you have a 30-pack-year history of smoking and currently smoke or have quit within the past 15 years. Fecal occult blood test (FOBT) of the stool. You may have this test every year starting at age 59. Flexible sigmoidoscopy or colonoscopy. You may have a sigmoidoscopy every 5 years or a colonoscopy every 10 years starting at age 4. Hepatitis C blood test. Hepatitis B blood test. Sexually transmitted disease (STD) testing. Diabetes screening. This is done by checking your blood sugar (glucose) after you have not eaten for a while (fasting). You may have this done every 1-3 years. Bone density scan. This is done to screen for osteoporosis. You may have this done starting at age 38. Mammogram. This may be done every 1-2 years. Talk to your health care provider about how often you should have regular mammograms. Talk with your health care provider about your test results, treatment options, and if necessary, the need for more tests. Vaccines  Your health care provider may recommend  certain vaccines, such as: Influenza vaccine. This is recommended every year. Tetanus, diphtheria, and acellular  pertussis (Tdap, Td) vaccine. You may need a Td booster every 10 years. Zoster vaccine. You may need this after age 89. Pneumococcal 13-valent conjugate (PCV13) vaccine. One dose is recommended after age 59. Pneumococcal polysaccharide (PPSV23) vaccine. One dose is recommended after age 90. Talk to your health care provider about which screenings and vaccines you need and how often you need them. This information is not intended to replace advice given to you by your health care provider. Make sure you discuss any questions you have with your health care provider. Document Released: 03/06/2015 Document Revised: 10/28/2015 Document Reviewed: 12/09/2014 Elsevier Interactive Patient Education  2017 [employer] Prevention in the Home Falls can cause injuries. They can happen to people of all ages. There are many things you can do to make your home safe and to help prevent falls. What can I do on the outside of my home? Regularly fix the edges of walkways and driveways and fix any cracks. Remove anything that might make you trip as you walk through a door, such as a raised step or threshold. Trim any bushes or trees on the path to your home. Use bright outdoor lighting. Clear any walking paths of anything that might make someone trip, such as rocks or tools. Regularly check to see if handrails are loose or broken. Make sure that both sides of any steps have handrails. Any raised decks and porches should have guardrails on the edges. Have any leaves, snow, or ice cleared regularly. Use sand or salt on walking paths during winter. Clean up any spills in your garage right away. This includes oil or grease spills. What can I do in the bathroom? Use night lights. Install grab bars by the toilet and in the tub and shower. Do not use towel bars as grab bars. Use non-skid mats or decals in the tub or shower. If you need to sit down in the shower, use a plastic, non-slip stool. Keep the floor  dry. Clean up any water that spills on the floor as soon as it happens. Remove soap buildup in the tub or shower regularly. Attach bath mats securely with double-sided non-slip rug tape. Do not have throw rugs and other things on the floor that can make you trip. What can I do in the bedroom? Use night lights. Make sure that you have a light by your bed that is easy to reach. Do not use any sheets or blankets that are too big for your bed. They should not hang down onto the floor. Have a firm chair that has side arms. You can use this for support while you get dressed. Do not have throw rugs and other things on the floor that can make you trip. What can I do in the kitchen? Clean up any spills right away. Avoid walking on wet floors. Keep items that you use a lot in easy-to-reach places. If you need to reach something above you, use a strong step stool that has a grab bar. Keep electrical cords out of the way. Do not use floor polish or wax that makes floors slippery. If you must use wax, use non-skid floor wax. Do not have throw rugs and other things on the floor that can make you trip. What can I do with my stairs? Do not leave any items on the stairs. Make sure that there are handrails on both sides of the  stairs and use them. Fix handrails that are broken or loose. Make sure that handrails are as long as the stairways. Check any carpeting to make sure that it is firmly attached to the stairs. Fix any carpet that is loose or worn. Avoid having throw rugs at the top or bottom of the stairs. If you do have throw rugs, attach them to the floor with carpet tape. Make sure that you have a light switch at the top of the stairs and the bottom of the stairs. If you do not have them, ask someone to add them for you. What else can I do to help prevent falls? Wear shoes that: Do not have high heels. Have rubber bottoms. Are comfortable and fit you well. Are closed at the toe. Do not wear  sandals. If you use a stepladder: Make sure that it is fully opened. Do not climb a closed stepladder. Make sure that both sides of the stepladder are locked into place. Ask someone to hold it for you, if possible. Clearly mark and make sure that you can see: Any grab bars or handrails. First and last steps. Where the edge of each step is. Use tools that help you move around (mobility aids) if they are needed. These include: Canes. Walkers. Scooters. Crutches. Turn on the lights when you go into a dark area. Replace any light bulbs as soon as they burn out. Set up your furniture so you have a clear path. Avoid moving your furniture around. If any of your floors are uneven, fix them. If there are any pets around you, be aware of where they are. Review your medicines with your doctor. Some medicines can make you feel dizzy. This can increase your chance of falling. Ask your doctor what other things that you can do to help prevent falls. This information is not intended to replace advice given to you by your health care provider. Make sure you discuss any questions you have with your health care provider. Document Released: 12/04/2008 Document Revised: 07/16/2015 Document Reviewed: 03/14/2014 Elsevier Interactive Patient Education  2017 [employer].

## 2020-12-01 NOTE — Progress Notes (Signed)
Subjective:   Robin [name] is a 80 y.o. female who presents for Medicare Annual (Subsequent) preventive examination.  Virtual Visit via Telephone Note  I connected with  [name] on 12/01/20 at 11:15 AM EDT by telephone and verified that I am speaking with the correct person using two identifiers.  Location: Patient: Home Provider: [facility] Persons participating in the virtual visit: patient/Nurse Health Advisor   I discussed the limitations, risks, security and privacy concerns of performing an evaluation and management service by telephone and the availability of in person appointments. The patient expressed understanding and agreed to proceed.  Interactive audio and video telecommunications were attempted between this nurse and patient, however failed, due to patient having technical difficulties OR patient did not have access to video capability.  We continued and completed visit with audio only.  Some vital signs may be absent or patient reported.   Edison Nicholson E Chelsea Pedretti, LPN   Review of Systems     Cardiac Risk Factors include: advanced age (>67men, >63 women);dyslipidemia;hypertension;family history of premature cardiovascular disease;Other (see comment), Risk factor comments: underweight     Objective:    Today's Vitals   12/01/20 1122 12/01/20 1126  Weight: 98 lb (44.5 kg)   Height: 5\' 1"  (1.549 m)   PainSc:  4    Body mass index is 18.52 kg/m.  Advanced Directives 12/01/2020 04/22/2020 04/20/2020 06/09/2016 06/08/2015 12/04/2014 07/18/2014  Does Patient Have a Medical Advance Directive? Yes Yes Yes Yes Yes Yes Yes  Type of Paramedic of [place];Living will [place];Living will [place];Living will [place];Living will [place];Living will [place];Living will Living will;Healthcare Power of Attorney  Does patient want to make changes to medical  advance directive? - - - - No - Patient declined No - Patient declined -  Copy of [place] in Chart? No - copy requested - - Yes Yes Yes No - copy requested  Would patient like information on creating a medical advance directive? - - - - - - No - patient declined information    Current Medications (verified) Outpatient Encounter Medications as of 12/01/2020  Medication Sig   aspirin EC 81 MG tablet Take 81 mg by mouth daily.   atorvastatin (LIPITOR) 80 MG tablet Take 1 tablet (80 mg total) by mouth daily.   Cholecalciferol (VITAMIN D) 2000 UNITS tablet Take 4000IU on saturdays and sundays and 2000IU all other days. (Patient taking differently: Take 2,000 Units by mouth See admin instructions. Take 1 tablet (2000 units) on Mondays through Fridays and take 2 tablets (4000 units) by mouth on Saturdays & Sundays.)   denosumab (PROLIA) 60 MG/ML SOSY injection BRING TO OFFICE FOR ADMINISTRATION. ADMINISTER IN UPPER ARM, THIGH OR ABDOMEN   ENSURE PLUS (ENSURE PLUS) LIQD Take 1 Can by mouth daily. Dx:  R63.6 (underweight)   escitalopram (LEXAPRO) 10 MG tablet Take 1 tablet (10 mg total) by mouth daily.   lisinopril (ZESTRIL) 10 MG tablet Take 1 tablet (10 mg total) by mouth daily.   No facility-administered encounter medications on file as of 12/01/2020.    Allergies (verified) Patient has no known allergies.   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 Sharlett Iles: 5cm circumferential fungating tumor at the hepatic flexure, path tubulovillous adenoma.     COLONOSCOPY  Jan 02, 2006   Dr. 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 Multiple polyps around anastomosis of different sizes,    COLONOSCOPY  2008   Dr. Sharlett Iles. Referred to Dr. Georgette Dover for resection   COLONOSCOPY N/A 07/18/2014   Procedure: COLONOSCOPY;  Surgeon: Daneil Dolin, MD;  [facility];  Service: Endoscopy;  Laterality: N/A;  1230   COLONOSCOPY WITH PROPOFOL N/A 04/22/2020   Procedure: COLONOSCOPY WITH PROPOFOL;  Surgeon: Rogene Houston, MD;  [facility];  Service: Endoscopy;  Laterality: N/A;  am   EYE SURGERY     LAPAROSCOPIC LYSIS OF ADHESIONS     PARTIAL COLECTOMY  2007   due to tubulovillous adenoma   TUBAL LIGATION     Family History  Problem Relation Age of Onset   Diabetes Mother    Heart disease Mother    Cancer Mother        patient unsure of type   Hip fracture Mother    Heart disease Father        antigioplasty   Cancer Sister        lung   COPD Sister    Heart disease Sister    Atrial fibrillation Sister    Heart disease Sister        stent in left leg   Varicose Veins Sister    Deep vein thrombosis Sister    Heart disease Brother    COPD Brother    COPD Brother    Huntington's disease Daughter    Carpal tunnel syndrome Daughter    Brain cancer Son    Colon cancer Neg Hx    Social History   Socioeconomic History   Marital status: Widowed    Spouse name: Not on file   Number of children: 6   Years of education: Not on file   Highest education level: Not on file  Occupational History   Not on file  Tobacco Use   Smoking status: Former    Years: 1.00    Types: Cigarettes    Quit date: 11/01/1959    Years since quitting: 61.1   Smokeless tobacco: Never  Vaping Use   Vaping Use: Never used  Substance and Sexual Activity   Alcohol use: No    Alcohol/week: 0.0 standard drinks   Drug use: No   Sexual activity: Never  Other Topics Concern   Not on file  Social History  Narrative   Her son lives with her   Lives in apartment with elevator   Had 6 children, one son passed at age 83   Social Determinants of Health   Financial Resource Strain: Low Risk    Difficulty of Paying Living Expenses: Not hard at all  Food Insecurity: No Food Insecurity   Worried About Charity fundraiser in the Last Year: Never true   Arboriculturist in the Last Year: Never true  Transportation Needs: No Transportation Needs   Lack of Transportation (Medical): No   Lack of Transportation (Non-Medical): No  Physical Activity: Sufficiently Active   Days of Exercise per Week: 7 days  Minutes of Exercise per Session: 30 min  Stress: No Stress Concern Present   Feeling of Stress : Not at all  Social Connections: Moderately Isolated   Frequency of Communication with Friends and Family: More than three times a week   Frequency of Social Gatherings with Friends and Family: More than three times a week   Attends Religious Services: More than 4 times per year   Active Member of [employer] or Organizations: No   Attends Archivist Meetings: Never   Marital Status: Widowed    Tobacco Counseling Counseling given: Not Answered   Clinical Intake:  Pre-visit preparation completed: Yes  Pain : 0-10 Pain Score: 4  Pain Type: Chronic pain Pain Location: Hip Pain Orientation: Right, Left Pain Descriptors / Indicators: Aching, Discomfort, Tender Pain Onset: More than a month ago Pain Frequency: Intermittent     BMI - recorded: 18.52 Nutritional Status: BMI <19  Underweight Nutritional Risks: None Diabetes: No  How often do you need to have someone help you when you read instructions, pamphlets, or other written materials from your doctor or pharmacy?: 1 - Never  Diabetic? No  Interpreter Needed?: No  Information entered by :: Breyonna Nault, LPN   Activities of Daily Living In your present state of health, do you have any difficulty performing the following  activities: 12/01/2020 04/20/2020  Hearing? 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 N Y  Difficulty concentrating or making decisions? N N  Walking or climbing stairs? Y N  Comment hurts hips -  Dressing or bathing? N N  Doing errands, shopping? N -  Preparing Food and eating ? N -  Using the Toilet? N -  In the past six months, have you accidently leaked urine? N -  Do you have problems with loss of bowel control? N -  Managing your Medications? N -  Managing your Finances? N -  Housekeeping or managing your Housekeeping? N -  Some recent data might be hidden    Patient Care Team: Chevis Pretty, FNP as PCP - General (Nurse Practitioner) Daneil Dolin, MD as Consulting Physician (Gastroenterology)  Indicate any recent Medical Services you may have received from other than [facility] providers in the past year (date may be approximate).     Assessment:   This is a routine wellness examination for [place].  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 - goes to [facility] in [place] - Comments:: Wears rx glasses - up to date with annual eye exams at [place] issues and exercise activities discussed: Current Exercise Habits: Home exercise routine, Type of exercise: walking;strength training/weights;stretching, Time (Minutes): 30, Frequency (Times/Week): 7, Weekly Exercise (Minutes/Week): 210, Intensity: Mild, Exercise limited by: None identified   Goals Addressed             This Visit's Progress    DIET - EAT MORE FRUITS AND VEGETABLES         Depression Screen PHQ 2/9 Scores 12/01/2020 06/18/2020 11/18/2019 05/16/2019 04/24/2019 02/19/2019 12/19/2017  PHQ - 2 Score 0 0 0 0 0 0 0  PHQ- 9 Score - - - - - - -    Fall Risk Fall Risk  12/01/2020 06/18/2020 11/18/2019 05/16/2019 04/24/2019  Falls in the past year? 0 0 0 0 0  Number falls in past yr: 0 - - - 0  Injury with Fall?  0 - - - 0  Risk for fall due to : Orthopedic patient - - - No Fall Risks  Follow up Education provided;Falls prevention discussed - - - Falls evaluation completed    FALL RISK PREVENTION PERTAINING TO THE HOME:  Any stairs in or around the home? No  If so, are there any without handrails? No  Home free of loose throw rugs in walkways, pet beds, electrical cords, etc? Yes  Adequate lighting in your home to reduce risk of falls? Yes   ASSISTIVE DEVICES UTILIZED TO PREVENT FALLS:  Life alert? No  Use of a cane, walker or w/c? No  Grab bars in the bathroom? Yes  Shower chair or bench in shower? Yes  Elevated toilet seat or a handicapped toilet? Yes   TIMED UP AND GO:  Was the test performed? No . Telephonic visit  Cognitive Function: Normal cognitive status assessed by direct observation by this Nurse Health Advisor. No abnormalities found.   MMSE - Mini Mental State Exam 06/09/2016 06/08/2015 12/04/2014 05/30/2014  Orientation to time 5 5 5 5   Orientation to Place 5 5 5 5   Registration 3 3 3 3   Attention/ Calculation 5 4 5 5   Recall 3 3 3 3   Language- name 2 objects 2 2 2 2   Language- repeat 1 1 1 1   Language- follow 3 step command 3 3 3 3   Language- read & follow direction 1 1 1 1   Write a sentence 1 1 1 1   Copy design 1 1 1 1   Total score 30 29 30 30         Immunizations Immunization History  Administered Date(s) Administered   Fluad Quad(high Dose 65+) 11/18/2019   Influenza, High Dose Seasonal PF 12/06/2016, 12/20/2017   Influenza, Quadrivalent, Recombinant, Inj, Pf 12/10/2018   Influenza,inj,Quad PF,6+ Mos 12/05/2012, 11/28/2013, 12/04/2014, 11/06/2018   Moderna SARS-COV2 Booster Vaccination 01/11/2020   Moderna Sars-Covid-2 Vaccination 04/18/2019, 05/17/2019   Pneumococcal Conjugate-13 05/30/2014   Pneumococcal Polysaccharide-23 12/05/2012   Tdap 05/30/2014   Zoster, Live 10/03/2014    TDAP status: Up to date  Flu Vaccine status: Due, Education has been  provided regarding the importance of this vaccine. Advised may receive this vaccine at local pharmacy or Health Dept. Aware to provide a copy of the vaccination record if obtained from local pharmacy or Health Dept. Verbalized acceptance and understanding.  Pneumococcal vaccine status: Up to date  Covid-19 vaccine status: Completed vaccines  Qualifies for Shingles Vaccine? Yes   Zostavax completed Yes   Shingrix Completed?: No.    Education has been provided regarding the importance of this vaccine. Patient has been advised to call insurance company to determine out of pocket expense if they have not yet received this vaccine. Advised may also receive vaccine at local pharmacy or Health Dept. Verbalized acceptance and understanding.  Screening Tests Health Maintenance  Topic Date Due   Zoster Vaccines- Shingrix (1 of 2) Never done   MAMMOGRAM  02/17/2019   COVID-19 Vaccine (4 - Booster for Moderna series) 04/04/2020   INFLUENZA VACCINE  09/21/2020   DEXA SCAN  02/03/2021   TETANUS/TDAP  05/29/2024   COLONOSCOPY (Pts 45-56yrs Insurance coverage will need to be confirmed)  04/22/2025   Hepatitis C Screening  Completed   HPV VACCINES  Aged Out    Health Maintenance  Health Maintenance Due  Topic Date Due   Zoster Vaccines- Shingrix (1 of 2) Never done   MAMMOGRAM  02/17/2019   COVID-19 Vaccine (4 - Booster for Moderna series) 04/04/2020  INFLUENZA VACCINE  09/21/2020    Colorectal cancer screening: No longer required.   Mammogram status: Ordered 12/01/2020. Pt provided with contact info and advised to call to schedule appt.   Bone Density status: Completed 02/04/2019. Results reflect: Bone density results: OSTEOPOROSIS. Repeat every 2 years.  Lung Cancer Screening: (Low Dose CT Chest recommended if Age 33-80 years, 30 pack-year currently smoking OR have quit w/in 15years.) does not qualify.   Additional Screening:  Hepatitis C Screening: does not qualify; Completed  11/18/2019  Vision Screening: Recommended annual ophthalmology exams for early detection of glaucoma and other disorders of the eye. Is the patient up to date with their annual eye exam?  Yes  Who is the provider or what is the name of the office in which the patient attends annual eye exams? [place] If pt is not established with a provider, would they like to be referred to a provider to establish care? No .   Dental Screening: Recommended annual dental exams for proper oral hygiene  Community Resource Referral / Chronic Care Management: CRR required this visit?  No   CCM required this visit?  No      Plan:     I have personally reviewed and noted the following in the patient's chart:   Medical and social history Use of alcohol, tobacco or illicit drugs  Current medications and supplements including opioid prescriptions.  Functional ability and status Nutritional status Physical activity Advanced directives List of other physicians Hospitalizations, surgeries, and ER visits in previous 12 months Vitals Screenings to include cognitive, depression, and falls Referrals and appointments  In addition, I have reviewed and discussed with patient certain preventive protocols, quality metrics, and best practice recommendations. A written personalized care plan for preventive services as well as general preventive health recommendations were provided to patient.     Sandrea Hammond, LPN   [id number]   Nurse Notes: She has a lump that comes and goes on outer right thigh concerning her; next appt 10/31

## 2020-12-02 ENCOUNTER — Other Ambulatory Visit: Payer: Self-pay | Admitting: Nurse Practitioner

## 2020-12-02 DIAGNOSIS — Z1231 Encounter for screening mammogram for malignant neoplasm of breast: Secondary | ICD-10-CM

## 2020-12-21 ENCOUNTER — Encounter: Payer: Self-pay | Admitting: Nurse Practitioner

## 2020-12-21 ENCOUNTER — Other Ambulatory Visit: Payer: Self-pay

## 2020-12-21 ENCOUNTER — Ambulatory Visit: Payer: Self-pay | Admitting: Nurse Practitioner

## 2020-12-21 ENCOUNTER — Ambulatory Visit (INDEPENDENT_AMBULATORY_CARE_PROVIDER_SITE_OTHER): Payer: Medicare Other | Admitting: Nurse Practitioner

## 2020-12-21 VITALS — BP 133/85 | HR 80 | Temp 97.5°F | Resp 20 | Ht 61.0 in | Wt 99.0 lb

## 2020-12-21 DIAGNOSIS — R636 Underweight: Secondary | ICD-10-CM | POA: Diagnosis not present

## 2020-12-21 DIAGNOSIS — M5432 Sciatica, left side: Secondary | ICD-10-CM | POA: Diagnosis not present

## 2020-12-21 DIAGNOSIS — M810 Age-related osteoporosis without current pathological fracture: Secondary | ICD-10-CM

## 2020-12-21 DIAGNOSIS — Z23 Encounter for immunization: Secondary | ICD-10-CM

## 2020-12-21 DIAGNOSIS — E559 Vitamin D deficiency, unspecified: Secondary | ICD-10-CM | POA: Diagnosis not present

## 2020-12-21 DIAGNOSIS — I10 Essential (primary) hypertension: Secondary | ICD-10-CM | POA: Diagnosis not present

## 2020-12-21 DIAGNOSIS — F411 Generalized anxiety disorder: Secondary | ICD-10-CM | POA: Diagnosis not present

## 2020-12-21 DIAGNOSIS — E7800 Pure hypercholesterolemia, unspecified: Secondary | ICD-10-CM | POA: Diagnosis not present

## 2020-12-21 MED ORDER — PREDNISONE 20 MG PO TABS: ORAL_TABLET | ORAL | 0 refills | Status: DC

## 2020-12-21 MED ORDER — LISINOPRIL 10 MG PO TABS: 10.0000 mg | ORAL_TABLET | Freq: Every day | ORAL | 1 refills | Status: DC

## 2020-12-21 MED ORDER — METHYLPREDNISOLONE ACETATE 80 MG/ML IJ SUSP
80.0000 mg | Freq: Once | INTRAMUSCULAR | Status: AC
  Administered 2020-12-21: 80 mg via INTRAMUSCULAR

## 2020-12-21 MED ORDER — ATORVASTATIN CALCIUM 80 MG PO TABS: 80.0000 mg | ORAL_TABLET | Freq: Every day | ORAL | 1 refills | Status: DC

## 2020-12-21 MED ORDER — ESCITALOPRAM OXALATE 10 MG PO TABS
10.0000 mg | ORAL_TABLET | Freq: Every day | ORAL | 1 refills | Status: DC
Start: 2020-12-21 — End: 2021-06-18

## 2020-12-21 NOTE — Patient Instructions (Signed)

## 2020-12-21 NOTE — Progress Notes (Signed)
Subjective:    Patient ID: Robin [name], female    DOB: 19-Sep-1940, 80 y.o.   MRN: [id number]   Chief Complaint: medical management of chronic issues     HPI:  1. Essential hypertension No c/o chest pain, sob or headache. Does not check blood pressure at home. BP Readings from Last 3 Encounters:  06/18/20 (!) 141/86  04/22/20 111/70  04/20/20 (!) 143/93     2. Pure hypercholesterolemia Has a verygood appetite but never seems to gain weight Lab Results  Component Value Date   CHOL 218 (H) 06/18/2020   HDL 55 06/18/2020   LDLCALC 149 (H) 06/18/2020   TRIG 81 06/18/2020   CHOLHDL 4.0 06/18/2020    3. GAD (generalized anxiety disorder) Is on lexapro daily and that seems to be working well for her. Her main problem is that she worries a lot but she says she has always been that way. GAD 7 : Generalized Anxiety Score 12/21/2020 11/18/2019 05/16/2019 12/19/2017  Nervous, Anxious, on Edge 0 0 0 3  Control/stop worrying 0 0 0 2  Worry too much - different things 0 0 0 0  Trouble relaxing 0 0 0 0  Restless 0 0 0 0  Easily annoyed or irritable 0 0 0 2  Afraid - awful might happen 0 0 0 0  Total GAD 7 Score 0 0 0 7  Anxiety Difficulty Not difficult at all Not difficult at all Not difficult at all Not difficult at all    Depression screen [facility] 2/9 12/21/2020 12/01/2020 06/18/2020  Decreased Interest 0 0 0  Down, Depressed, Hopeless 0 0 0  PHQ - 2 Score 0 0 0  Altered sleeping 3 - -  Tired, decreased energy 0 - -  Change in appetite 0 - -  Feeling bad or failure about yourself  0 - -  Trouble concentrating 0 - -  Moving slowly or fidgety/restless 0 - -  Suicidal thoughts 0 - -  PHQ-9 Score 3 - -  Difficult doing work/chores Not difficult at all - -     4. Age related osteoporosis, unspecified pathological fracture presence Last dexascan was done on 02/04/19. Her t score was -3.3. She really does no weight bearing exercises. Is currently taking prolia injections.  5.  Vitamin D insufficiency Is on daily vitamin d supplement  6. Underweight No recent weight changes Wt Readings from Last 3 Encounters:  12/21/20 99 lb (44.9 kg)  12/01/20 98 lb (44.5 kg)  06/18/20 95 lb (43.1 kg)   BMI Readings from Last 3 Encounters:  12/21/20 18.71 kg/m  12/01/20 18.52 kg/m  06/18/20 17.95 kg/m      Outpatient Encounter Medications as of 12/21/2020  Medication Sig   aspirin EC 81 MG tablet Take 81 mg by mouth daily.   atorvastatin (LIPITOR) 80 MG tablet Take 1 tablet (80 mg total) by mouth daily.   Cholecalciferol (VITAMIN D) 2000 UNITS tablet Take 4000IU on saturdays and sundays and 2000IU all other days. (Patient taking differently: Take 2,000 Units by mouth See admin instructions. Take 1 tablet (2000 units) on Mondays through Fridays and take 2 tablets (4000 units) by mouth on Saturdays & Sundays.)   denosumab (PROLIA) 60 MG/ML SOSY injection BRING TO OFFICE FOR ADMINISTRATION. ADMINISTER IN UPPER ARM, THIGH OR ABDOMEN   ENSURE PLUS (ENSURE PLUS) LIQD Take 1 Can by mouth daily. Dx:  R63.6 (underweight)   escitalopram (LEXAPRO) 10 MG tablet Take 1 tablet (10 mg total) by mouth daily.  lisinopril (ZESTRIL) 10 MG tablet Take 1 tablet (10 mg total) by mouth daily.   No facility-administered encounter medications on file as of 12/21/2020.    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 Sharlett Iles: 5cm circumferential fungating tumor at the hepatic flexure, path tubulovillous adenoma.    COLONOSCOPY  Jan 02, 2006   Dr. 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 Multiple polyps around anastomosis of different sizes,    COLONOSCOPY  2008   Dr. Sharlett Iles. Referred to Dr. Georgette Dover for resection   COLONOSCOPY N/A 07/18/2014   Procedure: COLONOSCOPY;  Surgeon: Daneil Dolin, MD;   [facility];  Service: Endoscopy;  Laterality: N/A;  1230   COLONOSCOPY WITH PROPOFOL N/A 04/22/2020   Procedure: COLONOSCOPY WITH PROPOFOL;  Surgeon: Rogene Houston, MD;  [facility];  Service: Endoscopy;  Laterality: N/A;  am   EYE SURGERY     LAPAROSCOPIC LYSIS OF ADHESIONS     PARTIAL COLECTOMY  2007   due to tubulovillous adenoma   TUBAL LIGATION      Family History  Problem Relation Age of Onset   Diabetes Mother    Heart disease Mother    Cancer Mother        patient unsure of type   Hip fracture Mother    Heart disease Father        antigioplasty   Cancer Sister        lung   COPD Sister    Heart disease Sister    Atrial fibrillation Sister    Heart disease Sister        stent in left leg   Varicose Veins Sister    Deep vein thrombosis Sister    Heart disease Brother    COPD Brother    COPD Brother    Huntington's disease Daughter    Carpal tunnel syndrome Daughter    Brain cancer Son    Colon cancer Neg Hx     New complaints: Low back pain that radiates into left hip and down leg. Started about 3 weeks ago. Worse when standing or walking a lot. Rates pain 8-10/10.  Social history: Her son lived with her  Controlled substance contract: n/a     Review of Systems  Constitutional:  Negative for diaphoresis.  Eyes:  Negative for pain.  Respiratory:  Negative for shortness of breath.   Cardiovascular:  Negative for chest pain, palpitations and leg swelling.  Gastrointestinal:  Negative for abdominal pain.  Endocrine: Negative for polydipsia.  Skin:  Negative for rash.  Neurological:  Negative for dizziness, weakness and headaches.  Hematological:  Does not bruise/bleed easily.  All other systems reviewed and are negative.     Objective:   Physical Exam Vitals and nursing note reviewed.  Constitutional:      General: She is not in acute distress.    Appearance: Normal appearance. She is well-developed.  HENT:     Head:  Normocephalic.     Right Ear: Tympanic membrane normal.     Left Ear: Tympanic membrane normal.     Nose: Nose normal.     Mouth/Throat:     Mouth: Mucous membranes are moist.  Eyes:     Pupils: Pupils are equal, round, and reactive to light.  Neck:     Vascular: No carotid bruit  or JVD.  Cardiovascular:     Rate and Rhythm: Normal rate and regular rhythm.     Heart sounds: Normal heart sounds.  Pulmonary:     Effort: Pulmonary effort is normal. No respiratory distress.     Breath sounds: Normal breath sounds. No wheezing or rales.  Chest:     Chest wall: No tenderness.  Abdominal:     General: Bowel sounds are normal. There is no distension or abdominal bruit.     Palpations: Abdomen is soft. There is no hepatomegaly, splenomegaly, mass or pulsatile mass.     Tenderness: There is no abdominal tenderness.  Musculoskeletal:        General: Normal range of motion.     Cervical back: Normal range of motion and neck supple.  Lymphadenopathy:     Cervical: No cervical adenopathy.  Skin:    General: Skin is warm and dry.  Neurological:     Mental Status: She is alert and oriented to person, place, and time.     Deep Tendon Reflexes: Reflexes are normal and symmetric.  Psychiatric:        Behavior: Behavior normal.        Thought Content: Thought content normal.        Judgment: Judgment normal.    BP 133/85   Pulse 80   Temp (!) 97.5 F (36.4 C) (Temporal)   Resp 20   Ht '5\' 1"'  (1.549 m)   Wt 99 lb (44.9 kg)   SpO2 99%   BMI 18.71 kg/m        Assessment & Plan:   Robin [name] comes in today with chief complaint of Medical Management of Chronic Issues   Diagnosis and orders addressed:  1. Essential hypertension Low sodium diet - lisinopril (ZESTRIL) 10 MG tablet; Take 1 tablet (10 mg total) by mouth daily.  Dispense: 90 tablet; Refill: 1 - CBC with Differential/Platelet - CMP14+EGFR  2. Pure hypercholesterolemia Low fat diet - atorvastatin (LIPITOR) 80 MG  tablet; Take 1 tablet (80 mg total) by mouth daily.  Dispense: 90 tablet; Refill: 1 - Lipid panel  3. GAD (generalized anxiety disorder) Stress management - escitalopram (LEXAPRO) 10 MG tablet; Take 1 tablet (10 mg total) by mouth daily.  Dispense: 90 tablet; Refill: 1  4. Age related osteoporosis, unspecified pathological fracture presence Weight bearing exercises Continue prolia as needed Repeat dexascan in december  5. Vitamin D insufficiency Continue daly vitamin d suplement  6. Underweight Continue daily ensure supplements  7. Sciatica of left side Moist heat rest - methylPREDNISolone acetate (DEPO-MEDROL) injection 80 mg - predniSONE (DELTASONE) 20 MG tablet; 2 po at sametime daily for 5 days-  Dispense: 10 tablet; Refill: 0   Labs pending Health Maintenance reviewed Diet and exercise encouraged  Follow up plan: 6 months   [name] Done, FNP

## 2020-12-22 LAB — CMP14+EGFR
ALT: 5 [IU]/L (ref 0–32)
AST: 14 [IU]/L (ref 0–40)
Albumin/Globulin Ratio: 1.8 (ref 1.2–2.2)
Albumin: 4.2 g/dL (ref 3.7–4.7)
Alkaline Phosphatase: 64 [IU]/L (ref 44–121)
BUN/Creatinine Ratio: 11 — ABNORMAL LOW (ref 12–28)
BUN: 8 mg/dL (ref 8–27)
Bilirubin Total: 0.5 mg/dL (ref 0.0–1.2)
CO2: 25 mmol/L (ref 20–29)
Calcium: 8.7 mg/dL (ref 8.7–10.3)
Chloride: 105 mmol/L (ref 96–106)
Creatinine, Ser: 0.75 mg/dL (ref 0.57–1.00)
Globulin, Total: 2.3 g/dL (ref 1.5–4.5)
Glucose: 76 mg/dL (ref 70–99)
Potassium: 3.9 mmol/L (ref 3.5–5.2)
Sodium: 144 mmol/L (ref 134–144)
Total Protein: 6.5 g/dL (ref 6.0–8.5)
eGFR: 81 mL/min/{1.73_m2}

## 2020-12-22 LAB — CBC WITH DIFFERENTIAL/PLATELET
Basophils Absolute: 0 10*3/uL (ref 0.0–0.2)
Basos: 1 %
EOS (ABSOLUTE): 0.2 10*3/uL (ref 0.0–0.4)
Eos: 3 %
Hematocrit: 33 % — ABNORMAL LOW (ref 34.0–46.6)
Hemoglobin: 10.8 g/dL — ABNORMAL LOW (ref 11.1–15.9)
Immature Grans (Abs): 0 10*3/uL (ref 0.0–0.1)
Immature Granulocytes: 0 %
Lymphocytes Absolute: 1.2 10*3/uL (ref 0.7–3.1)
Lymphs: 22 %
MCH: 29.1 pg (ref 26.6–33.0)
MCHC: 32.7 g/dL (ref 31.5–35.7)
MCV: 89 fL (ref 79–97)
Monocytes Absolute: 0.4 10*3/uL (ref 0.1–0.9)
Monocytes: 8 %
Neutrophils Absolute: 3.4 10*3/uL (ref 1.4–7.0)
Neutrophils: 66 %
Platelets: 306 10*3/uL (ref 150–450)
RBC: 3.71 x10E6/uL — ABNORMAL LOW (ref 3.77–5.28)
RDW: 13 % (ref 11.7–15.4)
WBC: 5.3 10*3/uL (ref 3.4–10.8)

## 2020-12-22 LAB — LIPID PANEL
Chol/HDL Ratio: 5.1 ratio — ABNORMAL HIGH (ref 0.0–4.4)
Cholesterol, Total: 238 mg/dL — ABNORMAL HIGH (ref 100–199)
HDL: 47 mg/dL (ref >39)
LDL Chol Calc (NIH): 168 mg/dL — ABNORMAL HIGH (ref 0–99)
Triglycerides: 129 mg/dL (ref 0–149)
VLDL Cholesterol Cal: 23 mg/dL (ref 5–40)

## 2021-01-20 ENCOUNTER — Ambulatory Visit
Admission: RE | Admit: 2021-01-20 | Discharge: 2021-01-20 | Disposition: A | Discharge status: Home / Self Care | Payer: Medicare Other | Source: Ambulatory Visit | Attending: Nurse Practitioner | Admitting: Nurse Practitioner

## 2021-01-20 ENCOUNTER — Other Ambulatory Visit: Payer: Self-pay

## 2021-01-20 DIAGNOSIS — Z1231 Encounter for screening mammogram for malignant neoplasm of breast: Secondary | ICD-10-CM

## 2021-02-03 ENCOUNTER — Other Ambulatory Visit: Payer: Self-pay

## 2021-02-03 ENCOUNTER — Ambulatory Visit (INDEPENDENT_AMBULATORY_CARE_PROVIDER_SITE_OTHER): Payer: Medicare Other

## 2021-02-03 DIAGNOSIS — R636 Underweight: Secondary | ICD-10-CM

## 2021-02-03 DIAGNOSIS — E559 Vitamin D deficiency, unspecified: Secondary | ICD-10-CM | POA: Diagnosis not present

## 2021-02-03 DIAGNOSIS — M810 Age-related osteoporosis without current pathological fracture: Secondary | ICD-10-CM | POA: Diagnosis not present

## 2021-02-03 DIAGNOSIS — Z780 Asymptomatic menopausal state: Secondary | ICD-10-CM

## 2021-02-04 ENCOUNTER — Ambulatory Visit (INDEPENDENT_AMBULATORY_CARE_PROVIDER_SITE_OTHER): Payer: Medicare Other

## 2021-02-04 ENCOUNTER — Encounter: Payer: Self-pay | Admitting: Nurse Practitioner

## 2021-02-04 ENCOUNTER — Ambulatory Visit (INDEPENDENT_AMBULATORY_CARE_PROVIDER_SITE_OTHER): Payer: Medicare Other | Admitting: Nurse Practitioner

## 2021-02-04 VITALS — BP 133/86 | HR 68 | Temp 98.0°F | Resp 20 | Ht 61.0 in | Wt 98.0 lb

## 2021-02-04 DIAGNOSIS — M5432 Sciatica, left side: Secondary | ICD-10-CM

## 2021-02-04 DIAGNOSIS — M25552 Pain in left hip: Secondary | ICD-10-CM

## 2021-02-04 DIAGNOSIS — M1612 Unilateral primary osteoarthritis, left hip: Secondary | ICD-10-CM | POA: Diagnosis not present

## 2021-02-04 DIAGNOSIS — M25559 Pain in unspecified hip: Secondary | ICD-10-CM

## 2021-02-04 DIAGNOSIS — M810 Age-related osteoporosis without current pathological fracture: Secondary | ICD-10-CM | POA: Diagnosis not present

## 2021-02-04 DIAGNOSIS — M8588 Other specified disorders of bone density and structure, other site: Secondary | ICD-10-CM | POA: Diagnosis not present

## 2021-02-04 DIAGNOSIS — Z780 Asymptomatic menopausal state: Secondary | ICD-10-CM | POA: Diagnosis not present

## 2021-02-04 MED ORDER — METHYLPREDNISOLONE ACETATE 80 MG/ML IJ SUSP
80.0000 mg | Freq: Once | INTRAMUSCULAR | Status: AC
  Administered 2021-02-04: 80 mg via INTRAMUSCULAR

## 2021-02-04 MED ORDER — PREDNISONE 20 MG PO TABS: ORAL_TABLET | ORAL | 0 refills | Status: DC

## 2021-02-04 NOTE — Patient Instructions (Signed)

## 2021-02-04 NOTE — Progress Notes (Signed)
Subjective:    Patient ID: Robin [name], female    DOB: 10/26/1940, 80 y.o.   MRN: [id number]   Chief Complaint: Hip Pain (Left/)   HPI Patient in today c/o left hip pain. Started about 2 weeks ago. Rate spin 8/10. Standing and walking increases pain. Nothing helps the pain. Has tried OTC pain patches which have helped some. Pain is in left buttocks and radiates down the back of left leg    Review of Systems  Constitutional:  Negative for diaphoresis.  Eyes:  Negative for pain.  Respiratory:  Negative for shortness of breath.   Cardiovascular:  Negative for chest pain, palpitations and leg swelling.  Gastrointestinal:  Negative for abdominal pain.  Endocrine: Negative for polydipsia.  Skin:  Negative for rash.  Neurological:  Negative for dizziness, weakness and headaches.  Hematological:  Does not bruise/bleed easily.  All other systems reviewed and are negative.     Objective:   Physical Exam Vitals and nursing note reviewed.  Constitutional:      General: She is not in acute distress.    Appearance: Normal appearance. She is well-developed.  Neck:     Vascular: No carotid bruit or JVD.  Cardiovascular:     Rate and Rhythm: Normal rate and regular rhythm.     Heart sounds: Normal heart sounds.  Pulmonary:     Effort: Pulmonary effort is normal. No respiratory distress.     Breath sounds: Normal breath sounds. No wheezing or rales.  Chest:     Chest wall: No tenderness.  Abdominal:     General: Bowel sounds are normal. There is no distension or abdominal bruit.     Palpations: Abdomen is soft. There is no hepatomegaly, splenomegaly, mass or pulsatile mass.     Tenderness: There is no abdominal tenderness.  Musculoskeletal:        General: Normal range of motion.     Cervical back: Normal range of motion and neck supple.     Comments:  Pain tenderness left hip FROM  of left hip without pain (-) SLR bil    Lymphadenopathy:     Cervical: No cervical  adenopathy.  Skin:    General: Skin is warm and dry.  Neurological:     Mental Status: She is alert and oriented to person, place, and time.     Deep Tendon Reflexes: Reflexes are normal and symmetric.  Psychiatric:        Behavior: Behavior normal.        Thought Content: Thought content normal.        Judgment: Judgment normal.     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 Hip pain - DG HIP UNILAT W OR W/O PELVIS 2-3 VIEWS LEFT  2. Sciatica of left side Moist heat Rest RTO prn - methylPREDNISolone acetate (DEPO-MEDROL) injection 80 mg - predniSONE (DELTASONE) 20 MG tablet; 2 po at sametime daily for 5 days-  Dispense: 10 tablet; Refill: 0    The above assessment and management  plan was discussed with the patient. The patient verbalized understanding of and has agreed to the management plan. Patient is aware to call the clinic if symptoms persist or worsen. Patient is aware when to return to the clinic for a follow-up visit. Patient educated on when it is appropriate to go to the emergency department.   [name] Done, FNP

## 2021-02-05 ENCOUNTER — Telehealth: Payer: Self-pay | Admitting: Nurse Practitioner

## 2021-02-05 NOTE — Telephone Encounter (Signed)
Pt returning call abour DEXA scan. Please call back.

## 2021-02-05 NOTE — Telephone Encounter (Signed)
Reviewed - note in results

## 2021-03-23 ENCOUNTER — Telehealth: Payer: Self-pay | Admitting: *Deleted

## 2021-03-23 DIAGNOSIS — I10 Essential (primary) hypertension: Secondary | ICD-10-CM

## 2021-03-23 MED ORDER — LISINOPRIL 10 MG PO TABS: 10.0000 mg | ORAL_TABLET | Freq: Every day | ORAL | 1 refills | Status: DC

## 2021-03-23 MED ORDER — PROLIA 60 MG/ML ~~LOC~~ SOSY: PREFILLED_SYRINGE | SUBCUTANEOUS | 0 refills | Status: DC

## 2021-03-23 NOTE — Telephone Encounter (Signed)
Pt called and aware PROLIA sent to [facility] today.  She is aware to pick it up and call the office to schedule the injection on or after 04/11/21 once she has it in hand.

## 2021-03-26 ENCOUNTER — Ambulatory Visit: Payer: Medicare Other

## 2021-03-30 NOTE — Telephone Encounter (Signed)
Patient scheduled on 02/20 for prolia injection.

## 2021-04-12 ENCOUNTER — Ambulatory Visit (INDEPENDENT_AMBULATORY_CARE_PROVIDER_SITE_OTHER): Payer: Medicare Other | Admitting: Family Medicine

## 2021-04-12 DIAGNOSIS — M810 Age-related osteoporosis without current pathological fracture: Secondary | ICD-10-CM

## 2021-04-12 MED ORDER — DENOSUMAB 60 MG/ML ~~LOC~~ SOSY
60.0000 mg | PREFILLED_SYRINGE | Freq: Once | SUBCUTANEOUS | Status: AC
  Administered 2021-04-12: 60 mg via SUBCUTANEOUS

## 2021-06-18 ENCOUNTER — Ambulatory Visit (INDEPENDENT_AMBULATORY_CARE_PROVIDER_SITE_OTHER): Payer: Medicare Other | Admitting: Nurse Practitioner

## 2021-06-18 ENCOUNTER — Encounter: Payer: Self-pay | Admitting: Nurse Practitioner

## 2021-06-18 VITALS — BP 138/76 | HR 72 | Temp 97.6°F | Resp 20 | Ht 61.0 in | Wt 96.0 lb

## 2021-06-18 DIAGNOSIS — F411 Generalized anxiety disorder: Secondary | ICD-10-CM | POA: Diagnosis not present

## 2021-06-18 DIAGNOSIS — M25552 Pain in left hip: Secondary | ICD-10-CM | POA: Diagnosis not present

## 2021-06-18 DIAGNOSIS — E559 Vitamin D deficiency, unspecified: Secondary | ICD-10-CM

## 2021-06-18 DIAGNOSIS — E7800 Pure hypercholesterolemia, unspecified: Secondary | ICD-10-CM | POA: Diagnosis not present

## 2021-06-18 DIAGNOSIS — R636 Underweight: Secondary | ICD-10-CM

## 2021-06-18 DIAGNOSIS — I10 Essential (primary) hypertension: Secondary | ICD-10-CM

## 2021-06-18 DIAGNOSIS — M810 Age-related osteoporosis without current pathological fracture: Secondary | ICD-10-CM

## 2021-06-18 MED ORDER — LISINOPRIL 10 MG PO TABS: 10.0000 mg | ORAL_TABLET | Freq: Every day | ORAL | 1 refills | Status: DC

## 2021-06-18 MED ORDER — ESCITALOPRAM OXALATE 10 MG PO TABS: 10.0000 mg | ORAL_TABLET | Freq: Every day | ORAL | 1 refills | Status: DC

## 2021-06-18 MED ORDER — KETOROLAC TROMETHAMINE 60 MG/2ML IM SOLN
60.0000 mg | Freq: Once | INTRAMUSCULAR | Status: AC
  Administered 2021-06-18: 60 mg via INTRAMUSCULAR

## 2021-06-18 MED ORDER — ATORVASTATIN CALCIUM 80 MG PO TABS: 80.0000 mg | ORAL_TABLET | Freq: Every day | ORAL | 1 refills | Status: DC

## 2021-06-18 MED ORDER — METHYLPREDNISOLONE ACETATE 40 MG/ML IJ SUSP
80.0000 mg | Freq: Once | INTRAMUSCULAR | Status: AC
  Administered 2021-06-18: 80 mg via INTRAMUSCULAR

## 2021-06-18 NOTE — Progress Notes (Signed)
? ?Subjective:  ? ? Patient ID: Robin [name], female    DOB: 15-Aug-1940, 81 y.o.   MRN: [id number] ? ? ?Chief Complaint: medical management of chronic issues  ? ?  ? ?HPI: ? ?Robin [name] is a 81 y.o. who identifies as a female who was assigned female at birth.  ? ?Social history: ?Lives with: her son lives with her ?Work history: she has never worked. She  ? ? ?Comes in today for follow up of the following chronic medical issues: ? ?1. Essential hypertension ?No c/o chest pain, sob or headache. Does not check blood pressure at home. ?BP Readings from Last 3 Encounters:  ?02/04/21 133/86  ?12/21/20 133/85  ?06/18/20 (!) 141/86  ? ? ? ?2. Pure hypercholesterolemia ?Has a very prro appetite. Does not really wathc what she eats however. ?Lab Results  ?Component Value Date  ? CHOL 238 (H) 12/21/2020  ? HDL 47 12/21/2020  ? LDLCALC 168 (H) 12/21/2020  ? TRIG 129 12/21/2020  ? CHOLHDL 5.1 (H) 12/21/2020  ? ? ? ?3. GAD (generalized anxiety disorder) ?Is on lexapro which she thinks helps with her anxiety ? ?4. Vitamin D insufficiency ?Is on daily vitamin d supplement ? ?5. Age related osteoporosis, unspecified pathological fracture presence ?No weight bearing exercises. Her last dexascan was done 02/04/21. Her t score was -3.2. she is currently on prolia injections. ? ?6. Underweight ?Patient says she has a poor appetitie and does not eat a lot when she eats. She is on daily ensure. ? ? ?New complaints: ?Left hip pain. She says the bone is deteriorating and she says it hurts all the time. Rate [place] 7-8/10 worse when standing or sitting for long periods of time. ? ?No Known Allergies ?Outpatient Encounter Medications as of 06/18/2021  ?Medication Sig  ? aspirin EC 81 MG tablet Take 81 mg by mouth daily.  ? atorvastatin (LIPITOR) 80 MG tablet Take 1 tablet (80 mg total) by mouth daily.  ? Cholecalciferol (VITAMIN D) 2000 UNITS tablet Take 4000IU on saturdays and sundays and 2000IU all other days. (Patient taking  differently: Take 2,000 Units by mouth See admin instructions. Take 1 tablet (2000 units) on Mondays through Fridays and take 2 tablets (4000 units) by mouth on Saturdays & Sundays.)  ? denosumab (PROLIA) 60 MG/ML SOSY injection BRING TO OFFICE FOR ADMINISTRATION. ADMINISTER IN UPPER ARM, THIGH OR ABDOMEN  ? ENSURE PLUS (ENSURE PLUS) LIQD Take 1 Can by mouth daily. Dx:  R63.6 (underweight)  ? escitalopram (LEXAPRO) 10 MG tablet Take 1 tablet (10 mg total) by mouth daily.  ? lisinopril (ZESTRIL) 10 MG tablet Take 1 tablet (10 mg total) by mouth daily.  ? predniSONE (DELTASONE) 20 MG tablet 2 po at sametime daily for 5 days-  ? ?No facility-administered encounter medications on file as of 06/18/2021.  ? ? ?Past Surgical History:  ?Procedure Laterality Date  ? BREAST EXCISIONAL BIOPSY Left   ? benign  ? COLON RESECTION  02/21/2006  ? 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 COLONOSCOPY  05/22/2005  ? Dr. Sharlett Iles: 5cm circumferential fungating tumor at the hepatic flexure, path tubulovillous adenoma.   ? COLONOSCOPY  01/02/2006  ? Dr. 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 Multiple polyps around anastomosis of different sizes,   ? COLONOSCOPY  02/21/2006  ? Dr. Sharlett Iles. Referred to Dr. Georgette Dover for resection  ? COLONOSCOPY N/A 07/18/2014  ? Procedure: COLONOSCOPY;  Surgeon: Daneil Dolin, MD;  [facility]: [facility];  Service: Endoscopy;  Laterality: N/A;  1230  ? COLONOSCOPY WITH PROPOFOL N/A 04/22/2020  ? Procedure: COLONOSCOPY WITH PROPOFOL;  Surgeon: Rogene Houston, MD;  Location: [facility];  Service: Endoscopy;  Laterality: N/A;  am  ? EYE SURGERY    ? LAPAROSCOPIC LYSIS OF ADHESIONS    ? PARTIAL COLECTOMY  02/21/2005  ? due to tubulovillous adenoma  ? TUBAL LIGATION    ? ? ?Family History  ?Problem Relation Age of Onset  ? Diabetes Mother   ? Heart disease Mother   ? Cancer Mother   ?     patient unsure of type  ? Hip  fracture Mother   ? Heart disease Father   ?     antigioplasty  ? Cancer Sister   ?     lung  ? COPD Sister   ? Heart disease Sister   ? Atrial fibrillation Sister   ? Heart disease Sister   ?     stent in left leg  ? Varicose Veins Sister   ? Deep vein thrombosis Sister   ? Heart disease Brother   ? COPD Brother   ? COPD Brother   ? Huntington's disease Daughter   ? Carpal tunnel syndrome Daughter   ? Brain cancer Son   ? Colon cancer Neg Hx   ? ? ? ? ?Controlled substance contract: n/a ? ? ? ? ?Review of Systems  ?Constitutional:  Negative for diaphoresis.  ?Eyes:  Negative for pain.  ?Respiratory:  Negative for shortness of breath.   ?Cardiovascular:  Negative for chest pain, palpitations and leg swelling.  ?Gastrointestinal:  Negative for abdominal pain.  ?Endocrine: Negative for polydipsia.  ?Skin:  Negative for rash.  ?Neurological:  Negative for dizziness, weakness and headaches.  ?Hematological:  Does not bruise/bleed easily.  ?All other systems reviewed and are negative. ? ?   ?Objective:  ? Physical Exam ?Vitals and nursing note reviewed.  ?Constitutional:   ?   General: She is not in acute distress. ?   Appearance: Normal appearance. She is well-developed.  ?HENT:  ?   Head: Normocephalic.  ?   Right Ear: Tympanic membrane normal.  ?   Left Ear: Tympanic membrane normal.  ?   Nose: Nose normal.  ?   Mouth/Throat:  ?   Mouth: Mucous membranes are moist.  ?Eyes:  ?   Pupils: Pupils are equal, round, and reactive to light.  ?Neck:  ?   Vascular: No carotid bruit or JVD.  ?Cardiovascular:  ?   Rate and Rhythm: Normal rate and regular rhythm.  ?   Heart sounds: Normal heart sounds.  ?Pulmonary:  ?   Effort: Pulmonary effort is normal. No respiratory distress.  ?   Breath sounds: Normal breath sounds. No wheezing or rales.  ?Chest:  ?   Chest wall: No tenderness.  ?Abdominal:  ?   General: Bowel sounds are normal. There is no distension or abdominal bruit.  ?   Palpations: Abdomen is soft. There is no  hepatomegaly, splenomegaly, mass or pulsatile mass.  ?   Tenderness: There is no abdominal tenderness.  ?Musculoskeletal:     ?   General: Normal range of motion.  ?   Cervical back: Normal range of motion and neck supple.  ?   Comments: Pain on extension of left hip.  ?Pain with weight bearing  ?Lymphadenopathy:  ?   Cervical: No cervical adenopathy.  ?Skin: ?   General: Skin is warm  and dry.  ?Neurological:  ?   Mental Status: She is alert and oriented to person, place, and time.  ?   Deep Tendon Reflexes: Reflexes are normal and symmetric.  ?Psychiatric:     ?   Behavior: Behavior normal.     ?   Thought Content: Thought content normal.     ?   Judgment: Judgment normal.  ? ? ?BP 138/76   Pulse 72   Temp 97.6 ?F (36.4 ?C) (Temporal)   Resp 20   Ht _0  (1.549 m)   Wt 96 lb (43.5 kg)   SpO2 97%   BMI 18.14 kg/m?  ? ? ? ?   ?Assessment & Plan:  ? ?[name] comes in today with chief complaint of Medical Management of Chronic Issues ? ? ?Diagnosis and orders addressed: ? ?1. Essential hypertension ?Low sodium diet ?- lisinopril (ZESTRIL) 10 MG tablet; Take 1 tablet (10 mg total) by mouth daily.  Dispense: 90 tablet; Refill: 1 ?- CBC with Differential/Platelet ?- CMP14+EGFR ? ?2. Pure hypercholesterolemia ?Low fat diet ?- atorvastatin (LIPITOR) 80 MG tablet; Take 1 tablet (80 mg total) by mouth daily.  Dispense: 90 tablet; Refill: 1 ?- Lipid panel ? ?3. GAD (generalized anxiety disorder) ?Stress management ?- escitalopram (LEXAPRO) 10 MG tablet; Take 1 tablet (10 mg total) by mouth daily.  Dispense: 90 tablet; Refill: 1 ? ?4. Vitamin D insufficiency ?Continue daily vitamin d supplement ? ?5. Age related osteoporosis, unspecified pathological fracture presence ?Weight bearing exercises. Continue prolia ? ?6. Underweight ?Continue ensure daily ? ?7. Left hip pain ?Moist heat ?rest ?- Ambulatory referral to Orthopedic Surgery ?- methylPREDNISolone acetate (DEPO-MEDROL) injection 80 mg ?- ketorolac  (TORADOL) injection 60 mg ? ? ?Labs pending ?Health Maintenance reviewed ?Diet and exercise encouraged ? ?Follow up plan: ?6 months ? ? ?[name] Done, FNP ? ?

## 2021-06-18 NOTE — Patient Instructions (Signed)
Hip Pain The hip is the joint between the upper legs and the lower pelvis. The bones, cartilage, tendons, and muscles of your hip joint support your body and allow you to move around. Hip pain can range from a minor ache to severe pain in one or both of your hips. The pain may be felt on the inside of the hip joint near the groin, or on the outside near the buttocks and upper thigh. You may also have swelling or stiffness in your hip area. Follow these instructions at home: Managing pain, stiffness, and swelling     If directed, put ice on the painful area. To do this: Put ice in a plastic bag. Place a towel between your skin and the bag. Leave the ice on for 20 minutes, 2-3 times a day. If directed, apply heat to the affected area as often as told by your health care provider. Use the heat source that your health care provider recommends, such as a moist heat pack or a heating pad. Place a towel between your skin and the heat source. Leave the heat on for 20-30 minutes. Remove the heat if your skin turns bright red. This is especially important if you are unable to feel pain, heat, or cold. You may have a greater risk of getting burned. Activity Do exercises as told by your health care provider. Avoid activities that cause pain. General instructions  Take over-the-counter and prescription medicines only as told by your health care provider. Keep a journal of your symptoms. Write down: How often you have hip pain. The location of your pain. What the pain feels like. What makes the pain worse. Sleep with a pillow between your legs on your most comfortable side. Keep all follow-up visits as told by your health care provider. This is important. Contact a health care provider if: You cannot put weight on your leg. Your pain or swelling continues or gets worse after one week. It gets harder to walk. You have a fever. Get help right away if: You fall. You have a sudden increase in pain  and swelling in your hip. Your hip is red or swollen or very tender to touch. Summary Hip pain can range from a minor ache to severe pain in one or both of your hips. The pain may be felt on the inside of the hip joint near the groin, or on the outside near the buttocks and upper thigh. Avoid activities that cause pain. Write down how often you have hip pain, the location of the pain, what makes it worse, and what it feels like. This information is not intended to replace advice given to you by your health care provider. Make sure you discuss any questions you have with your health care provider. Document Revised: 06/25/2018 Document Reviewed: 06/25/2018 Elsevier Patient Education  2023 Elsevier Inc[employer]  

## 2021-06-24 ENCOUNTER — Ambulatory Visit (INDEPENDENT_AMBULATORY_CARE_PROVIDER_SITE_OTHER): Payer: Medicare Other | Admitting: Orthopaedic Surgery

## 2021-06-24 ENCOUNTER — Encounter: Payer: Self-pay | Admitting: Orthopaedic Surgery

## 2021-06-24 ENCOUNTER — Ambulatory Visit (INDEPENDENT_AMBULATORY_CARE_PROVIDER_SITE_OTHER): Payer: Medicare Other

## 2021-06-24 VITALS — BP 160/82 | HR 68 | Ht 61.0 in | Wt 96.0 lb

## 2021-06-24 DIAGNOSIS — M25552 Pain in left hip: Secondary | ICD-10-CM | POA: Diagnosis not present

## 2021-06-24 DIAGNOSIS — M5432 Sciatica, left side: Secondary | ICD-10-CM | POA: Diagnosis not present

## 2021-06-24 NOTE — Progress Notes (Signed)
? ?Subjective:  ? ? Patient ID: Robin [name], female    DOB: Mar 24, 1940, 81 y.o.   MRN: [id number] ? ?HPI ?She has been having left hip and buttock pain for the last several weeks.  She has no trauma, no weakness. She was seen at [facility] recently and had X-rays of the hips.  I have reviewed the notes from 06-18-21 as well as the X-rays. ? ?I have independently reviewed and interpreted x-rays of this patient done at another site by another physician or qualified health professional. ? ?She has pain from the left hip to the posterior left thigh, past the left knee and down the calf to the lateral left foot.  She has no weakness, no redness, no swelling.  She is not getting any better.   ? ?She has tried Tylenol which does not help much.  She has tried ice and heat. ? ? ? ?Review of Systems  ?Constitutional:  Positive for activity change.  ?Musculoskeletal:  Positive for arthralgias, back pain and myalgias.  ?All other systems reviewed and are negative. ?For Review of Systems, all other systems reviewed and are negative. ? ?The following is a summary of the past history medically, past history surgically, known current medicines, social history and family history.  This information is gathered electronically by the computer from prior information and documentation.  I review this each visit and have found including this information at this point in the chart is beneficial and informative.  ? ?Past Medical History:  ?Diagnosis Date  ? 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 Atrial fibrillation ([place])   ? Cataract   ? Hyperlipidemia   ? Hypertension   ? Osteoporosis   ? history of femur fracture  ? Shingles   ? Vitamin D deficiency   ? ? ?Past Surgical History:  ?Procedure Laterality Date  ? BREAST EXCISIONAL BIOPSY Left   ? benign  ? COLON RESECTION  02/21/2006  ? 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 COLONOSCOPY  05/22/2005  ? Dr. Sharlett Iles:  5cm circumferential fungating tumor at the hepatic flexure, path tubulovillous adenoma.   ? COLONOSCOPY  01/02/2006  ? Dr. 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 Multiple polyps around anastomosis of different sizes,   ? COLONOSCOPY  02/21/2006  ? Dr. Sharlett Iles. Referred to Dr. Georgette Dover for resection  ? COLONOSCOPY N/A 07/18/2014  ? Procedure: COLONOSCOPY;  Surgeon: Daneil Dolin, MD;  [facility];  Service: Endoscopy;  Laterality: N/A;  1230  ? COLONOSCOPY WITH PROPOFOL N/A 04/22/2020  ? Procedure: COLONOSCOPY WITH PROPOFOL;  Surgeon: Rogene Houston, MD;  [facility];  Service: Endoscopy;  Laterality: N/A;  am  ? EYE SURGERY    ? LAPAROSCOPIC LYSIS OF ADHESIONS    ? PARTIAL COLECTOMY  02/21/2005  ? due to tubulovillous adenoma  ? TUBAL LIGATION    ? ? ?Current Outpatient Medications on File Prior to Visit  ?Medication Sig Dispense Refill  ? aspirin EC 81 MG tablet Take 81 mg by mouth daily.    ? atorvastatin (LIPITOR) 80 MG tablet Take 1 tablet (80 mg total) by mouth daily. 90 tablet 1  ? Cholecalciferol (VITAMIN D) 2000 UNITS tablet Take 4000IU on saturdays and sundays and 2000IU all other days. (Patient taking differently: Take 2,000 Units by mouth See admin instructions. Take 1 tablet (2000 units) on Mondays through Fridays and take 2 tablets (4000 units) by mouth  on Saturdays & Sundays.)    ? denosumab (PROLIA) 60 MG/ML SOSY injection BRING TO OFFICE FOR ADMINISTRATION. ADMINISTER IN UPPER ARM, THIGH OR ABDOMEN 1 mL 0  ? ENSURE PLUS (ENSURE PLUS) LIQD Take 1 Can by mouth daily. Dx:  R63.6 (underweight) 7110 mL 2  ? escitalopram (LEXAPRO) 10 MG tablet Take 1 tablet (10 mg total) by mouth daily. 90 tablet 1  ? lisinopril (ZESTRIL) 10 MG tablet Take 1 tablet (10 mg total) by mouth daily. 90 tablet 1  ? ?No current facility-administered medications on file prior to visit.  ? ? ?Social History  ? ?Socioeconomic History  ? Marital status: Widowed  ?   Spouse name: Not on file  ? Number of children: 6  ? Years of education: Not on file  ? Highest education level: Not on file  ?Occupational History  ? Occupation: retired  ?Tobacco Use  ? Smoking status: Former  ?  Years: 1.00  ?  Types: Cigarettes  ?  Quit date: 11/01/1959  ?  Years since quitting: 61.6  ? Smokeless tobacco: Never  ?Vaping Use  ? Vaping Use: Never used  ?Substance and Sexual Activity  ? Alcohol use: No  ?  Alcohol/week: 0.0 standard drinks  ? Drug use: No  ? Sexual activity: Never  ?Other Topics Concern  ? Not on file  ?Social History Narrative  ? Her son lives with her  ? Lives in apartment with elevator  ? Had 6 children - 3 sons, 3 daughters, one son passed at age 72  ? ?Social Determinants of Health  ? ?Financial Resource Strain: Low Risk   ? Difficulty of Paying Living Expenses: Not hard at all  ?Food Insecurity: No Food Insecurity  ? Worried About Charity fundraiser in the Last Year: Never true  ? Ran Out of Food in the Last Year: Never true  ?Transportation Needs: No Transportation Needs  ? Lack of Transportation (Medical): No  ? Lack of Transportation (Non-Medical): No  ?Physical Activity: Sufficiently Active  ? Days of Exercise per Week: 7 days  ? Minutes of Exercise per Session: 30 min  ?Stress: No Stress Concern Present  ? Feeling of Stress : Not at all  ?Social Connections: Moderately Isolated  ? Frequency of Communication with Friends and Family: More than three times a week  ? Frequency of Social Gatherings with Friends and Family: More than three times a week  ? Attends Religious Services: More than 4 times per year  ? Active Member of Clubs or Organizations: No  ? Attends Archivist Meetings: Never  ? Marital Status: Widowed  ?Intimate Partner Violence: Not At Risk  ? Fear of Current or Ex-Partner: No  ? Emotionally Abused: No  ? Physically Abused: No  ? Sexually Abused: No  ? ? ?Family History  ?Problem Relation Age of Onset  ? Diabetes Mother   ? Heart disease Mother    ? Cancer Mother   ?     patient unsure of type  ? Hip fracture Mother   ? Heart disease Father   ?     antigioplasty  ? Cancer Sister   ?     lung  ? COPD Sister   ? Heart disease Sister   ? Atrial fibrillation Sister   ? Heart disease Sister   ?     stent in left leg  ? Varicose Veins Sister   ? Deep vein thrombosis Sister   ? Heart disease Brother   ?  COPD Brother   ? COPD Brother   ? Huntington's disease Daughter   ? Carpal tunnel syndrome Daughter   ? Brain cancer Son   ? Colon cancer Neg Hx   ? ? ?BP (!) 160/82   Pulse 68   Ht '5\' 1"'$  (1.549 m)   Wt 96 lb (43.5 kg)   BMI 18.14 kg/m?  ? ?Body mass index is 18.14 kg/m?. ? ?   ?Objective:  ? Physical Exam ?Vitals and nursing note reviewed. Exam conducted with a chaperone present.  ?Constitutional:   ?   Appearance: She is well-developed.  ?HENT:  ?   Head: Normocephalic and atraumatic.  ?Eyes:  ?   Conjunctiva/sclera: Conjunctivae normal.  ?   Pupils: Pupils are equal, round, and reactive to light.  ?Cardiovascular:  ?   Rate and Rhythm: Normal rate and regular rhythm.  ?Pulmonary:  ?   Effort: Pulmonary effort is normal.  ?Abdominal:  ?   Palpations: Abdomen is soft.  ?Musculoskeletal:  ?     Arms: ? ?   Cervical back: Normal range of motion and neck supple.  ?Skin: ?   General: Skin is warm and dry.  ?Neurological:  ?   Mental Status: She is alert and oriented to person, place, and time.  ?   Cranial Nerves: No cranial nerve deficit.  ?   Motor: No abnormal muscle tone.  ?   Coordination: Coordination normal.  ?   Deep Tendon Reflexes: Reflexes are normal and symmetric. Reflexes normal.  ?Psychiatric:     ?   Behavior: Behavior normal.     ?   Thought Content: Thought content normal.     ?   Judgment: Judgment normal.  ?X-rays were done of the lumbar spine, reported separately. ? ? ? ? ?   ?Assessment & Plan:  ? ?Encounter Diagnoses  ?Name Primary?  ? Sciatica, left side Yes  ? Left hip pain   ? ?She does have some degenerative changes of the hips but I feel  her pain is from her back and sciatica. ? ?I will have her take Aleve one bid pc. ? ?I will begin PT in [place]. ? ?Return here in five weeks. ? ?Call if any problem. ? ?Precautions discussed. ? ?Electronic

## 2021-07-05 ENCOUNTER — Ambulatory Visit: Payer: Medicare Other | Attending: Orthopaedic Surgery | Admitting: Physical Therapy

## 2021-07-05 DIAGNOSIS — R293 Abnormal posture: Secondary | ICD-10-CM | POA: Diagnosis not present

## 2021-07-05 DIAGNOSIS — M5432 Sciatica, left side: Secondary | ICD-10-CM | POA: Insufficient documentation

## 2021-07-05 DIAGNOSIS — M5459 Other low back pain: Secondary | ICD-10-CM | POA: Insufficient documentation

## 2021-07-05 NOTE — Therapy (Signed)
[facility] ?[place] ?[place], [place], [address] ?Phone: [phone]   Fax:  [phone] ? ?Physical Therapy Evaluation ? ?Patient Details  ?Name: Robin [name] ?MRN: [id number] ?Date of Birth: Jun 14, 1940 ?Referring Provider (PT): Sanjuana Kava MD ? ? ?Encounter Date: 07/05/2021 ? ? PT End of Session - 07/05/21 1258   ? ? Visit Number 1   ? Number of Visits 12   ? Date for PT Re-Evaluation 10/04/21   ? Authorization Type FOTO AT LEAST EVERY 5TH VISIT.  PROGRESS NOTE AT 10TH VISIT.  KX MODIFIER AFTER 15 VISITS.   ? PT Start Time 1117   ? PT Stop Time 1205   ? PT Time Calculation (min) 48 min   ? Activity Tolerance Patient tolerated treatment well   ? Behavior During Therapy [facility] for tasks assessed/performed   ? ?  ?  ? ?  ? ? ?Past Medical History:  ?Diagnosis Date  ? 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 Atrial fibrillation ([place])   ? Cataract   ? Hyperlipidemia   ? Hypertension   ? Osteoporosis   ? history of femur fracture  ? Shingles   ? Vitamin D deficiency   ? ? ?Past Surgical History:  ?Procedure Laterality Date  ? BREAST EXCISIONAL BIOPSY Left   ? benign  ? COLON RESECTION  02/21/2006  ? 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 COLONOSCOPY  05/22/2005  ? Dr. Sharlett Iles: 5cm circumferential fungating tumor at the hepatic flexure, path tubulovillous adenoma.   ? COLONOSCOPY  01/02/2006  ? Dr. 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 Multiple polyps around anastomosis of different sizes,   ? COLONOSCOPY  02/21/2006  ? Dr. Sharlett Iles. Referred to Dr. Georgette Dover for resection  ? COLONOSCOPY N/A 07/18/2014  ? Procedure: COLONOSCOPY;  Surgeon: Daneil Dolin, MD;  [facility];  Service: Endoscopy;  Laterality: N/A;  1230  ? COLONOSCOPY WITH PROPOFOL N/A 04/22/2020  ? Procedure: COLONOSCOPY WITH PROPOFOL;  Surgeon: Rogene Houston, MD;  [facility];  Service: Endoscopy;  Laterality: N/A;  am  ? EYE SURGERY    ? LAPAROSCOPIC LYSIS OF ADHESIONS    ? PARTIAL COLECTOMY  02/21/2005  ? due to tubulovillous adenoma  ? TUBAL LIGATION    ? ? ?There were no vitals filed for this visit. ? ? ? Subjective Assessment - 07/05/21 1259   ? ? Subjective The patient presents to the clinic today with a h/o low back pain dating back about 5 years but a recent flare-up several weeks ago.  Her CC today ispain in her left buttock that will down down the length of her left LE to ankle.  Her pain is rated at an 8/10 today and her pain rises with walking.  Sitting and resting decreases her pain.  She describes the pain as a shooting and throbbing type pain.  She reports no falls.  She uses OTC pain patches which help.   ? Pertinent History Left femoral fracture (left LE a bit shorter), HTN, OP.   ? How long can you sit comfortably? Unlimited.   ? How long can you stand comfortably? Varies.   ? How long can you walk comfortably? Varies.   ? Diagnostic tests X-rays.   ? Patient Stated Goals Get out of pain.   ? Currently in Pain? Yes   ? Pain Score 8    ? Pain Location  Buttocks   ? Pain Orientation Left   ? Pain Descriptors / Research scientist (life sciences);Throbbing   ? Pain Type Acute pain   ? Pain Radiating Towards Left LE.   ? Pain Onset More than a month ago   ? Pain Frequency Constant   ? Aggravating Factors  See above.   ? Pain Relieving Factors See above.   ? ?  ?  ? ?  ? ? ? ? ? [facility] PT Assessment - 07/05/21 0001   ? ?  ? Assessment  ? Medical Diagnosis Sciatica of left side.   ? Referring Provider (PT) Sanjuana Kava MD   ? Onset Date/Surgical Date --   Recent exacerbation several weeks ago.  ?  ? Precautions  ? Precaution Comments OP.   ?  ? Restrictions  ? Weight Bearing Restrictions No   ?  ? Balance Screen  ? Has the patient fallen in the past 6 months No   ? Has the patient had a decrease in activity level because of a fear of falling?  No   ? Is the patient reluctant to leave  their home because of a fear of falling?  No   ?  ? Home Environment  ? Living Environment Private residence   ?  ? Prior Function  ? Level of Independence Independent   ?  ? Observation/Other Assessments  ? Observations Scratch in left buttock region.   ? Focus on Therapeutic Outcomes (FOTO)  Complete.   ?  ? Posture/Postural Control  ? Posture/Postural Control Postural limitations   ? Postural Limitations Rounded Shoulders;Forward head;Increased thoracic kyphosis;Posterior pelvic tilt;Decreased lumbar lordosis   ? Posture Comments Bilateral genu valgum.  Left LE slight shorter due to a previous left femoral fracture.   ?  ? Deep Tendon Reflexes  ? DTR Assessment Site Patella;Achilles   ? Patella DTR 1+   ? Achilles DTR --   Right 1+/4+; left is absent.  ?  ? ROM / Strength  ? AROM / PROM / Strength AROM;Strength   ?  ? AROM  ? Overall AROM Comments In supine:  Normal bilateral hip range of motion.   ?  ? Strength  ? Overall Strength Comments Normal LE strength.   ?  ? Palpation  ? Palpation comment Tender to palpation in left mid-gluteal region.   ?  ? Special Tests  ? Other special tests (-) SLR testing.   ?  ? Ambulation/Gait  ? Gait Comments WNL.   ? ?  ?  ? ?  ? ? ? ? ? ? ? ? ? ? ? ? ? ?Objective measurements completed on examination: See above findings.  ? ? ? ? ? [facility] Adult PT Treatment/Exercise - 07/05/21 0001   ? ?  ? Modalities  ? Modalities Electrical Stimulation;Moist Heat   ?  ? Moist Heat Therapy  ? Number Minutes Moist Heat 20 Minutes   ? Moist Heat Location Lumbar Spine   ?  ? Electrical Stimulation  ? Electrical Stimulation Location Left gluteal region.   ? Electrical Stimulation Action IFC at 80-150 Hz.   ? Electrical Stimulation Parameters 40% scan x 20 minutes.   ? Electrical Stimulation Goals Pain   ? ?  ?  ? ?  ? ? ? ? ? ? ? ? ? ? ? ? ? ? ? ? ? ? ? ? ? ? ? Plan - 07/05/21 1523   ? ? Clinical Impression Statement The patient presents to OPPT with a  CC of left buttock pain and radiation of pain  down her left LE. Per patient she has had low back pain for about 5 years but this exacerbation occurred several weeks ago.  She was tender to palpation in her left mid-gluteal region.  She demonstrated a negative SLR.  Her left Achille's reflex was negative.  Her left LE is slightly shorter due to a femoral fracture.  Prolonged walking increases her pain.    Patient will benefit from skilled physical therapy intervention to address pain and deficits.   ? Personal Factors and Comorbidities Comorbidity 1;Comorbidity 2;Other   ? Comorbidities Left femoral fracture (left LE a bit shorter), HTN, OP.   ? Examination-Activity Limitations Other;Locomotion Level   ? Examination-Participation Restrictions Other   ? Stability/Clinical Decision Making Stable/Uncomplicated   ? Clinical Decision Making Low   ? Rehab Potential Excellent   ? PT Frequency 2x / week   ? PT Duration 6 weeks   ? PT Treatment/Interventions ADLs/Self Care Home Management;Cryotherapy;Electrical Stimulation;Ultrasound;Moist Heat;Therapeutic activities;Therapeutic exercise;Manual techniques;Patient/family education;Passive range of motion   ? PT Next Visit Plan Combo e'stim/US, STW/M, core exercise progression, no spinal loading due to OP.   ? Consulted and Agree with Plan of Care Patient   ? ?  ?  ? ?  ? ? ?Patient will benefit from skilled therapeutic intervention in order to improve the following deficits and impairments:  Pain, Decreased activity tolerance, Postural dysfunction ? ?Visit Diagnosis: ?Other low back pain - Plan: PT plan of care cert/re-cert ? ?Abnormal posture - Plan: PT plan of care cert/re-cert ? ? ? ? ?Problem List ?Patient Active Problem List  ? Diagnosis Date Noted  ? GAD (generalized anxiety disorder) 06/21/2018  ? Essential hypertension 10/23/2014  ? History of colonic polyps 06/27/2014  ? Underweight 05/30/2014  ? Vitamin D insufficiency 07/31/2013  ? Osteoporosis 10/31/2012  ? Hyperlipidemia 10/31/2012  ? ? ?Amberlyn Martinezgarcia, [place],  PT ?07/05/2021, 3:35 PM ? ?[facility] ?[place] ?[place], [place], [address] ?Phone: [phone]   Fax:  [phone] ? ?Name: Robin [name] ?MRN: [id number] ?Dat

## 2021-07-08 ENCOUNTER — Ambulatory Visit: Payer: Medicare Other

## 2021-07-08 DIAGNOSIS — M5459 Other low back pain: Secondary | ICD-10-CM

## 2021-07-08 DIAGNOSIS — R293 Abnormal posture: Secondary | ICD-10-CM | POA: Diagnosis not present

## 2021-07-08 DIAGNOSIS — M5432 Sciatica, left side: Secondary | ICD-10-CM | POA: Diagnosis not present

## 2021-07-08 NOTE — Therapy (Signed)
[facility] [place], [place], [address] Phone: [phone]   Fax:  [phone]  Physical Therapy Treatment  Patient Details  Name: Robin [name] MRN: [id number] Date of Birth: 08-15-1940 Referring Provider (PT): Sanjuana Kava MD   Encounter Date: 07/08/2021   PT End of Session - 07/08/21 1305     Visit Number 2    Number of Visits 12    Date for PT Re-Evaluation 10/04/21    Authorization Type FOTO AT LEAST EVERY 5TH VISIT.  PROGRESS NOTE AT 10TH VISIT.  KX MODIFIER AFTER 15 VISITS.    PT Start Time 1303    PT Stop Time 1345    PT Time Calculation (min) 42 min    Activity Tolerance Patient tolerated treatment well    Behavior During Therapy [facility] for tasks assessed/performed             Past Medical History:  Diagnosis Date   Adenomatous colon polyp 2007   with high grade dysplasia   Atrial fibrillation (HCC)    Cataract    Hyperlipidemia    Hypertension    Osteoporosis    history of femur fracture   Shingles    Vitamin D deficiency     Past Surgical History:  Procedure Laterality Date   BREAST EXCISIONAL BIOPSY Left    benign   COLON RESECTION  02/21/2006   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 Sharlett Iles: 5cm circumferential fungating tumor at the hepatic flexure, path tubulovillous adenoma.    COLONOSCOPY  01/02/2006   Dr. 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 Multiple polyps around anastomosis of different sizes,    COLONOSCOPY  02/21/2006   Dr. Sharlett Iles. Referred to Dr. Georgette Dover for resection   COLONOSCOPY N/A 07/18/2014   Procedure: COLONOSCOPY;  Surgeon: Daneil Dolin, MD;  [facility];  Service: Endoscopy;  Laterality: N/A;  1230   COLONOSCOPY WITH PROPOFOL N/A 04/22/2020   Procedure: COLONOSCOPY WITH PROPOFOL;  Surgeon: Rogene Houston, MD;  [facility];  Service: Endoscopy;  Laterality: N/A;  am   EYE SURGERY     LAPAROSCOPIC LYSIS OF ADHESIONS     PARTIAL COLECTOMY  02/21/2005   due to tubulovillous adenoma   TUBAL LIGATION      There were no vitals filed for this visit.   Subjective Assessment - 07/08/21 1305     Subjective Patient reports that she is feeling good today as she is not hurting.    Pertinent History Left femoral fracture (left LE a bit shorter), HTN, OP.    How long can you sit comfortably? Unlimited.    How long can you stand comfortably? Varies.    How long can you walk comfortably? Varies.    Diagnostic tests X-rays.    Patient Stated Goals Get out of pain.    Currently in Pain? No/denies    Pain Onset More than a month ago                               [facility] Adult PT Treatment/Exercise - 07/08/21 0001       Exercises   Exercises Knee/Hip      Knee/Hip Exercises: Stretches   Passive Hamstring Stretch Left;3 reps;30 seconds      Knee/Hip Exercises: Aerobic   Nustep L3 x 15 minutes  Knee/Hip Exercises: Standing   Forward Lunges Left;20 reps;Other (comment)   onto 6" step   Rocker Board 4 minutes      Knee/Hip Exercises: Seated   Long Arc Quad Left;20 reps;Weights    Long Arc Quad Weight 4 lbs.    Clamshell with TheraBand Red   2 minutes   Marching Both;Other (comment)   red t-band around knees; 2 minutes   Sit to Sand 20 reps;10 reps;without UE support                                 Plan - 07/08/21 1305     Clinical Impression Statement Patient presented to treatment with no pain or discomfort. She was introduced to multiple new interventions. She required minimal cueing with today's interventions for a slow controlled motion as she attempted to rush through these interventions. She reported no pain or discomfort with any of today's interventions. She reported feeling good upon the conclusion of treatment. She continues to require skilled  physical therapy to address her remaining impairments to return to her prior level of function.    Personal Factors and Comorbidities Comorbidity 1;Comorbidity 2;Other    Comorbidities Left femoral fracture (left LE a bit shorter), HTN, OP.    Examination-Activity Limitations Other;Locomotion Level    Examination-Participation Restrictions Other    Stability/Clinical Decision Making Stable/Uncomplicated    Rehab Potential Excellent    PT Frequency 2x / week    PT Duration 6 weeks    PT Treatment/Interventions ADLs/Self Care Home Management;Cryotherapy;Electrical Stimulation;Ultrasound;Moist Heat;Therapeutic activities;Therapeutic exercise;Manual techniques;Patient/family education;Passive range of motion    PT Next Visit Plan Combo e'stim/US, STW/M, core exercise progression, no spinal loading due to OP.    Consulted and Agree with Plan of Care Patient             Patient will benefit from skilled therapeutic intervention in order to improve the following deficits and impairments:  Pain, Decreased activity tolerance, Postural dysfunction  Visit Diagnosis: Other low back pain  Abnormal posture     Problem List Patient Active Problem List   Diagnosis Date Noted   GAD (generalized anxiety disorder) 06/21/2018   Essential hypertension 10/23/2014   History of colonic polyps 06/27/2014   Underweight 05/30/2014   Vitamin D insufficiency 07/31/2013   Osteoporosis 10/31/2012   Hyperlipidemia 10/31/2012    Darlin Coco, PT 07/08/2021, 2:10 PM  [facility] [address] [place], [place], [address] Phone: [phone]   Fax:  [phone]  Name: Robin [name] MRN: [id number] Date of Birth: Apr 17, 1940

## 2021-07-13 ENCOUNTER — Ambulatory Visit: Payer: Medicare Other

## 2021-07-13 DIAGNOSIS — R293 Abnormal posture: Secondary | ICD-10-CM | POA: Diagnosis not present

## 2021-07-13 DIAGNOSIS — M5432 Sciatica, left side: Secondary | ICD-10-CM | POA: Diagnosis not present

## 2021-07-13 DIAGNOSIS — M5459 Other low back pain: Secondary | ICD-10-CM | POA: Diagnosis not present

## 2021-07-13 NOTE — Therapy (Addendum)
[facility] [place], [place], [address] Phone: [phone]   Fax:  [phone]  Physical Therapy Treatment  Patient Details  Name: Robin [name] MRN: [id number] Date of Birth: [birth year] Referring Provider (PT): Sanjuana Kava MD   Encounter Date: 07/13/2021   PT End of Session - 07/13/21 1351     Visit Number 3    Number of Visits 12    Date for PT Re-Evaluation 10/04/21    Authorization Type FOTO AT LEAST EVERY 5TH VISIT.  PROGRESS NOTE AT 10TH VISIT.  KX MODIFIER AFTER 15 VISITS.    PT Start Time 4970    PT Stop Time 1440    PT Time Calculation (min) 55 min    Activity Tolerance Patient tolerated treatment well    Behavior During Therapy [facility] for tasks assessed/performed             Past Medical History:  Diagnosis Date   Adenomatous colon polyp 2007   with high grade dysplasia   Atrial fibrillation (HCC)    Cataract    Hyperlipidemia    Hypertension    Osteoporosis    history of femur fracture   Shingles    Vitamin D deficiency     Past Surgical History:  Procedure Laterality Date   BREAST EXCISIONAL BIOPSY Left    benign   COLON RESECTION  02/21/2006   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 Sharlett Iles: 5cm circumferential fungating tumor at the hepatic flexure, path tubulovillous adenoma.    COLONOSCOPY  01/02/2006   Dr. 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 Multiple polyps around anastomosis of different sizes,    COLONOSCOPY  02/21/2006   Dr. Sharlett Iles. Referred to Dr. Georgette Dover for resection   COLONOSCOPY N/A 07/18/2014   Procedure: COLONOSCOPY;  Surgeon: Daneil Dolin, MD;  [facility];  Service: Endoscopy;  Laterality: N/A;  1230   COLONOSCOPY WITH PROPOFOL N/A 04/22/2020   Procedure: COLONOSCOPY WITH PROPOFOL;  Surgeon: Rogene Houston, MD;  [facility];  Service: Endoscopy;  Laterality: N/A;  am   EYE SURGERY     LAPAROSCOPIC LYSIS OF ADHESIONS     PARTIAL COLECTOMY  02/21/2005   due to tubulovillous adenoma   TUBAL LIGATION      There were no vitals filed for this visit.   Subjective Assessment - 07/13/21 1350     Subjective Pt arrives for today's treatment session reporting "12/10" left LE pain.  Pt states that pain got up to a "30/10" last night.    Pertinent History Left femoral fracture (left LE a bit shorter), HTN, OP.    How long can you sit comfortably? Unlimited.    How long can you stand comfortably? Varies.    How long can you walk comfortably? Varies.    Diagnostic tests X-rays.    Patient Stated Goals Get out of pain.    Currently in Pain? Yes    Pain Score 10-Worst pain ever    Pain Location Leg    Pain Orientation Left    Pain Onset More than a month ago                               [facility] Adult PT Treatment/Exercise - 07/13/21 0001       Knee/Hip Exercises: Aerobic   Nustep Lvl 3 x 15  mins      Modalities   Modalities Electrical Stimulation;Moist Heat      Moist Heat Therapy   Number Minutes Moist Heat 20 Minutes    Moist Heat Location Lumbar Spine      Electrical Stimulation   Electrical Stimulation Location Left lumbar spine    Electrical Stimulation Action IFC    Electrical Stimulation Parameters 80-150 Hz x 20 mins    Electrical Stimulation Goals Pain      Manual Therapy   Manual Therapy Soft tissue mobilization    Soft tissue mobilization STW/M to left glute and piraformis to decrease pain and tone                          PT Long Term Goals - 07/13/21 1833       PT LONG TERM GOAL #1   Title Patient will be independent with her HEP.    Time 6    Period Weeks    Status New    Target Date 08/10/21      PT LONG TERM GOAL #2   Title Patient will be able to complete her daily activities without her pain exceeding a 6/10.    Time 6    Period  Weeks    Status New    Target Date 08/10/21      PT LONG TERM GOAL #3   Title Patient will be able to stand and walk for at least 30 minutes without being limited by her familiar pain for improved function with cooking and other household activities.    Time 6    Period Weeks    Status New    Target Date 08/10/21      PT LONG TERM GOAL #4   Title Patient will report being able to sleep throughout the night without being awakened by her familiar pain.    Time 6    Period Weeks    Status New    Target Date 08/10/21                   Plan - 07/13/21 1352     Clinical Impression Statement Pt arrives for today's treamtent session reporting 12/10 left low back and LE pain.  Pt states that she had a bad night last night and her pain increased to a "30/10."  STW/M performed to left glute and piraformis to decrease pain and tone with pt positioned in right side-lying.  Normal responses to estim nand MH noted upon removal.  Pt reported 0/10 left low back and buttock pain at completion of today's treatment session.    Personal Factors and Comorbidities Comorbidity 1;Comorbidity 2;Other    Comorbidities Left femoral fracture (left LE a bit shorter), HTN, OP.    Examination-Activity Limitations Other;Locomotion Level    Examination-Participation Restrictions Other    Stability/Clinical Decision Making Stable/Uncomplicated    Rehab Potential Excellent    PT Frequency 2x / week    PT Duration 6 weeks    PT Treatment/Interventions ADLs/Self Care Home Management;Cryotherapy;Electrical Stimulation;Ultrasound;Moist Heat;Therapeutic activities;Therapeutic exercise;Manual techniques;Patient/family education;Passive range of motion    PT Next Visit Plan Combo e'stim/US, STW/M, core exercise progression, no spinal loading due to OP.    Consulted and Agree with Plan of Care Patient             Patient will benefit from skilled therapeutic intervention in order to improve the following  deficits and impairments:  Pain, Decreased activity tolerance, Postural  dysfunction  Visit Diagnosis: Other low back pain  Abnormal posture     Problem List Patient Active Problem List   Diagnosis Date Noted   GAD (generalized anxiety disorder) 06/21/2018   Essential hypertension 10/23/2014   History of colonic polyps 06/27/2014   Underweight 05/30/2014   Vitamin D insufficiency 07/31/2013   Osteoporosis 10/31/2012   Hyperlipidemia 10/31/2012    Darlin Coco, PT 07/13/2021, 6:37 PM  [facility] [address] [place], [place], [address] Phone: [phone]   Fax:  [phone]  Name: JERALDEAN WECHTER MRN: [id number] Date of Birth: 1940/03/20

## 2021-07-15 ENCOUNTER — Ambulatory Visit: Payer: Medicare Other | Admitting: *Deleted

## 2021-07-15 DIAGNOSIS — M5459 Other low back pain: Secondary | ICD-10-CM

## 2021-07-15 DIAGNOSIS — M5432 Sciatica, left side: Secondary | ICD-10-CM | POA: Diagnosis not present

## 2021-07-15 DIAGNOSIS — R293 Abnormal posture: Secondary | ICD-10-CM

## 2021-07-15 NOTE — Therapy (Signed)
[facility] [place], [place], [address] Phone: [phone]   Fax:  [phone]  Physical Therapy Treatment  Patient Details  Name: Robin [name] MRN: [id number] Date of Birth: 1940/09/17 Referring Provider (PT): Sanjuana Kava MD   Encounter Date: 07/15/2021   PT End of Session - 07/15/21 1350     Visit Number 4    Number of Visits 12    Date for PT Re-Evaluation 10/04/21    Authorization Type FOTO AT LEAST EVERY 5TH VISIT.  PROGRESS NOTE AT 10TH VISIT.  KX MODIFIER AFTER 15 VISITS.    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 Sharlett Iles: 5cm circumferential fungating tumor at the hepatic flexure, path tubulovillous adenoma.    COLONOSCOPY  01/02/2006   Dr. 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 Multiple polyps around anastomosis of different sizes,    COLONOSCOPY  02/21/2006   Dr. Sharlett Iles. Referred to Dr. Georgette Dover for resection   COLONOSCOPY N/A 07/18/2014   Procedure: COLONOSCOPY;  Surgeon: Daneil Dolin, MD;  [facility];  Service: Endoscopy;  Laterality: N/A;  1230   COLONOSCOPY WITH PROPOFOL N/A 04/22/2020   Procedure: COLONOSCOPY WITH PROPOFOL;  Surgeon: Rogene Houston, MD;  [facility];  Service: Endoscopy;  Laterality: N/A;  am   EYE SURGERY     LAPAROSCOPIC LYSIS OF ADHESIONS     PARTIAL  COLECTOMY  02/21/2005   due to tubulovillous adenoma   TUBAL LIGATION      There were no vitals filed for this visit.   Subjective Assessment - 07/15/21 1349     Subjective Pt arrives for today's treatment session reporting 510 left LE pain today    Pertinent History Left femoral fracture (left LE a bit shorter), HTN, OP.    How long can you sit comfortably? Unlimited.    How long can you stand comfortably? Varies.    How long can you walk comfortably? Varies.    Diagnostic tests X-rays.    Patient Stated Goals Get out of pain.    Pain Location Leg    Pain Orientation Left    Pain Descriptors / Indicators Shooting;Throbbing    Pain Type Acute pain                               [facility] Adult PT Treatment/Exercise - 07/15/21 0001       Exercises   Exercises Knee/Hip      Knee/Hip Exercises: Stretches   Piriformis Stretch Left;3 reps;30 seconds      Knee/Hip Exercises: Aerobic   Nustep Lvl 3 x 15 mins      Modalities   Modalities Electrical Stimulation;Moist Heat  Moist Heat Therapy   Number Minutes Moist Heat 15 Minutes    Moist Heat Location Lumbar Spine      Electrical Stimulation   Electrical Stimulation Location Left lumbar spine    Electrical Stimulation Action IFC    Electrical Stimulation Parameters 80-'150hz'$ x 15 mins    Electrical Stimulation Goals Pain      Manual Therapy   Manual Therapy Soft tissue mobilization    Soft tissue mobilization STW/M to left glute and piraformis to decrease pain and tone x10 mins                          PT Long Term Goals - 07/13/21 1833       PT LONG TERM GOAL #1   Title Patient will be independent with her HEP.    Time 6    Period Weeks    Status New    Target Date 08/10/21      PT LONG TERM GOAL #2   Title Patient will be able to complete her daily activities without her pain exceeding a 6/10.    Time 6    Period Weeks    Status New    Target Date 08/10/21      PT LONG  TERM GOAL #3   Title Patient will be able to stand and walk for at least 30 minutes without being limited by her familiar pain for improved function with cooking and other household activities.    Time 6    Period Weeks    Status New    Target Date 08/10/21      PT LONG TERM GOAL #4   Title Patient will report being able to sleep throughout the night without being awakened by her familiar pain.    Time 6    Period Weeks    Status New    Target Date 08/10/21                   Plan - 07/15/21 1436     Clinical Impression Statement Pt arrived today doing better with decreased pain down to 5/10 today. She was able to perform some exs and piriformis stretching without increased pain. STW to LT glute and piriformis. Pt doing some better and is able to stand longer before pain starts.    Personal Factors and Comorbidities Comorbidity 1;Comorbidity 2;Other    Comorbidities Left femoral fracture (left LE a bit shorter), HTN, OP.    Examination-Activity Limitations Other;Locomotion Level    Examination-Participation Restrictions Other    Stability/Clinical Decision Making Stable/Uncomplicated    Rehab Potential Excellent    PT Frequency 2x / week    PT Duration 6 weeks    PT Treatment/Interventions ADLs/Self Care Home Management;Cryotherapy;Electrical Stimulation;Ultrasound;Moist Heat;Therapeutic activities;Therapeutic exercise;Manual techniques;Patient/family education;Passive range of motion    PT Next Visit Plan Combo e'stim/US, STW/M, core exercise progression, no spinal loading due to OP.    Consulted and Agree with Plan of Care Patient             Patient will benefit from skilled therapeutic intervention in order to improve the following deficits and impairments:  Pain, Decreased activity tolerance, Postural dysfunction  Visit Diagnosis: Other low back pain  Abnormal posture     Problem List Patient Active Problem List   Diagnosis Date Noted   GAD (generalized  anxiety disorder) 06/21/2018   Essential hypertension 10/23/2014   History of colonic polyps 06/27/2014   Underweight 05/30/2014  Vitamin D insufficiency 07/31/2013   Osteoporosis 10/31/2012   Hyperlipidemia 10/31/2012    Laurian Edrington,CHRIS, PTA 07/15/2021, 2:48 PM  [facility] [place], [place], [address] Phone: [phone]   Fax:  [phone]  Name: Robin [name] MRN: [id number] Date of Birth: Mar 25, 1940

## 2021-07-21 ENCOUNTER — Ambulatory Visit: Payer: Medicare Other

## 2021-07-21 DIAGNOSIS — R293 Abnormal posture: Secondary | ICD-10-CM | POA: Diagnosis not present

## 2021-07-21 DIAGNOSIS — M5459 Other low back pain: Secondary | ICD-10-CM

## 2021-07-21 DIAGNOSIS — M5432 Sciatica, left side: Secondary | ICD-10-CM | POA: Diagnosis not present

## 2021-07-21 NOTE — Therapy (Signed)
[facility] [place], [place], [address] Phone: [phone]   Fax:  [phone]  Physical Therapy Treatment  Patient Details  Name: Robin [name] MRN: [id number] Date of Birth: 15-May-1940 Referring Provider (PT): Sanjuana Kava MD   Encounter Date: 07/21/2021   PT End of Session - 07/21/21 1347     Visit Number 5    Number of Visits 12    Date for PT Re-Evaluation 10/04/21    Authorization Type FOTO AT LEAST EVERY 5TH VISIT.  PROGRESS NOTE AT 10TH VISIT.  KX MODIFIER AFTER 15 VISITS.    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 Sharlett Iles: 5cm circumferential fungating tumor at the hepatic flexure, path tubulovillous adenoma.    COLONOSCOPY  01/02/2006   Dr. 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 Multiple polyps around anastomosis of different sizes,    COLONOSCOPY  02/21/2006   Dr. Sharlett Iles. Referred to Dr. Georgette Dover for resection   COLONOSCOPY N/A 07/18/2014   Procedure: COLONOSCOPY;  Surgeon: Daneil Dolin, MD;  [facility];  Service: Endoscopy;  Laterality: N/A;  1230   COLONOSCOPY WITH PROPOFOL N/A 04/22/2020   Procedure: COLONOSCOPY WITH PROPOFOL;  Surgeon: Rogene Houston, MD;  [facility];  Service: Endoscopy;  Laterality: N/A;  am   EYE SURGERY     LAPAROSCOPIC LYSIS OF ADHESIONS     PARTIAL  COLECTOMY  02/21/2005   due to tubulovillous adenoma   TUBAL LIGATION      There were no vitals filed for this visit.   Subjective Assessment - 07/21/21 1346     Subjective Pt arrives for today's treatment session reporting 9/10 lower LE pain.    Pertinent History Left femoral fracture (left LE a bit shorter), HTN, OP.    How long can you sit comfortably? Unlimited.    How long can you stand comfortably? Varies.    How long can you walk comfortably? Varies.    Diagnostic tests X-rays.    Patient Stated Goals Get out of pain.    Currently in Pain? Yes    Pain Score 9     Pain Location Leg    Pain Orientation Left                [facility] PT Assessment - 07/21/21 0001       Observation/Other Assessments   Focus on Therapeutic Outcomes (FOTO)  53   5th visit                          [facility] Adult PT Treatment/Exercise - 07/21/21 0001       Knee/Hip Exercises: Aerobic   Nustep Lvl 3 x 20 mins  Modalities   Modalities Electrical Stimulation;Moist Heat      Moist Heat Therapy   Number Minutes Moist Heat 15 Minutes    Moist Heat Location Lumbar Spine      Electrical Stimulation   Electrical Stimulation Location Left lumbar spine    Electrical Stimulation Action IFC    Electrical Stimulation Parameters 80-150 Hz x 15 mins    Electrical Stimulation Goals Pain      Manual Therapy   Manual Therapy Soft tissue mobilization    Soft tissue mobilization STW/M to left glute and piriformis to decrease pain and tone                          PT Long Term Goals - 07/21/21 1358       PT LONG TERM GOAL #1   Title Patient will be independent with her HEP.    Time 6    Period Weeks    Status On-going    Target Date 08/10/21      PT LONG TERM GOAL #2   Title Patient will be able to complete her daily activities without her pain exceeding a 6/10.    Time 6    Period Weeks    Status On-going    Target Date 08/10/21      PT LONG TERM  GOAL #3   Title Patient will be able to stand and walk for at least 30 minutes without being limited by her familiar pain for improved function with cooking and other household activities.    Baseline 07/21/21: 15-20 mins before pain    Time 6    Period Weeks    Status On-going    Target Date 08/10/21      PT LONG TERM GOAL #4   Title Patient will report being able to sleep throughout the night without being awakened by her familiar pain.    Baseline 07/21/21: sleep interrupted throughout the night    Time 6    Period Weeks    Status On-going    Target Date 08/10/21                   Plan - 07/21/21 1348     Clinical Impression Statement Pt arrives for today's treatment session reporting 8-9/10 left LE pain.  Pt reports that pain is continuing to affect her every performance of ADLs and sleeping patterns.  Pt is making progress towards her goals at this time and increased her FOTO score to 53 today.  STW/M performed to left glute and piriformis to decrease pain and tone with pt positioned in right side-lying.  Normal responses to estim and MH noted upon removal.  Pt reported 2/10 left LE pain at completion of today's treatment session.    Personal Factors and Comorbidities Comorbidity 1;Comorbidity 2;Other    Comorbidities Left femoral fracture (left LE a bit shorter), HTN, OP.    Examination-Activity Limitations Other;Locomotion Level    Examination-Participation Restrictions Other    Stability/Clinical Decision Making Stable/Uncomplicated    Rehab Potential Excellent    PT Frequency 2x / week    PT Duration 6 weeks    PT Treatment/Interventions ADLs/Self Care Home Management;Cryotherapy;Electrical Stimulation;Ultrasound;Moist Heat;Therapeutic activities;Therapeutic exercise;Manual techniques;Patient/family education;Passive range of motion    PT Next Visit Plan Combo e'stim/US, STW/M, core exercise progression, no spinal loading due to OP.    Consulted and Agree with Plan of  Care Patient  Patient will benefit from skilled therapeutic intervention in order to improve the following deficits and impairments:  Pain, Decreased activity tolerance, Postural dysfunction  Visit Diagnosis: Other low back pain  Abnormal posture     Problem List Patient Active Problem List   Diagnosis Date Noted   GAD (generalized anxiety disorder) 06/21/2018   Essential hypertension 10/23/2014   History of colonic polyps 06/27/2014   Underweight 05/30/2014   Vitamin D insufficiency 07/31/2013   Osteoporosis 10/31/2012   Hyperlipidemia 10/31/2012   Rationale for Evaluation and Treatment Rehabilitation  Kathrynn Ducking, PTA 07/21/2021, 2:36 PM  [facility] [address] [place], [place], [address] Phone: [phone]   Fax:  [phone]  Name: Robin [name] MRN: [id number] Date of Birth: Sep 02, 1940

## 2021-07-28 ENCOUNTER — Ambulatory Visit: Payer: Medicare Other | Attending: Orthopaedic Surgery

## 2021-07-28 DIAGNOSIS — M5459 Other low back pain: Secondary | ICD-10-CM | POA: Diagnosis not present

## 2021-07-28 DIAGNOSIS — R293 Abnormal posture: Secondary | ICD-10-CM | POA: Diagnosis not present

## 2021-07-28 NOTE — Therapy (Signed)
[facility] [place], [place], [address] Phone: [phone]   Fax:  [phone]  Physical Therapy Treatment  Patient Details  Name: Robin [name] MRN: [id number] Date of Birth: [birth year] Referring Provider (PT): Sanjuana Kava MD   Encounter Date: 07/28/2021   PT End of Session - 07/28/21 1119     Visit Number 6    Number of Visits 12    Date for PT Re-Evaluation 10/04/21    Authorization Type FOTO AT LEAST EVERY 5TH VISIT.  PROGRESS NOTE AT 10TH VISIT.  KX MODIFIER AFTER 15 VISITS.    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 Sharlett Iles: 5cm circumferential fungating tumor at the hepatic flexure, path tubulovillous adenoma.    COLONOSCOPY  01/02/2006   Dr. 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 Multiple polyps around anastomosis of different sizes,    COLONOSCOPY  02/21/2006   Dr. Sharlett Iles. Referred to Dr. Georgette Dover for resection   COLONOSCOPY N/A 07/18/2014   Procedure: COLONOSCOPY;  Surgeon: Daneil Dolin, MD;  [facility];  Service: Endoscopy;  Laterality: N/A;  1230   COLONOSCOPY WITH PROPOFOL N/A 04/22/2020   Procedure: COLONOSCOPY WITH PROPOFOL;  Surgeon: Rogene Houston, MD;  [facility];  Service: Endoscopy;  Laterality: N/A;  am   EYE SURGERY     LAPAROSCOPIC LYSIS OF ADHESIONS     PARTIAL  COLECTOMY  02/21/2005   due to tubulovillous adenoma   TUBAL LIGATION      There were no vitals filed for this visit.   Subjective Assessment - 07/28/21 1118     Subjective Pt arrives for today's treatment session reporting "feeling pretty good" today.    Pertinent History Left femoral fracture (left LE a bit shorter), HTN, OP.    How long can you sit comfortably? Unlimited.    How long can you stand comfortably? Varies.    How long can you walk comfortably? Varies.    Diagnostic tests X-rays.    Patient Stated Goals Get out of pain.    Currently in Pain? No/denies                               [facility] Adult PT Treatment/Exercise - 07/28/21 0001       Knee/Hip Exercises: Aerobic   Nustep Lvl 4 x 20 mins      Knee/Hip Exercises: Standing   Heel Raises Both;20 reps    Hip Flexion Both;20 reps;Knee straight    Hip Abduction Both;20 reps;Knee straight    Hip Extension Both;20 reps;Knee straight    Rocker Board 4 minutes      Modalities   Modalities Electrical Stimulation;Moist Heat  Moist Heat Therapy   Number Minutes Moist Heat 15 Minutes    Moist Heat Location Lumbar Spine      Electrical Stimulation   Electrical Stimulation Location Left lumbar spine    Electrical Stimulation Action IFC    Electrical Stimulation Parameters 80-150 Hz x 15 mins    Electrical Stimulation Goals Pain                          PT Long Term Goals - 07/21/21 1358       PT LONG TERM GOAL #1   Title Patient will be independent with her HEP.    Time 6    Period Weeks    Status On-going    Target Date 08/10/21      PT LONG TERM GOAL #2   Title Patient will be able to complete her daily activities without her pain exceeding a 6/10.    Time 6    Period Weeks    Status On-going    Target Date 08/10/21      PT LONG TERM GOAL #3   Title Patient will be able to stand and walk for at least 30 minutes without being limited by her familiar pain for  improved function with cooking and other household activities.    Baseline 07/21/21: 15-20 mins before pain    Time 6    Period Weeks    Status On-going    Target Date 08/10/21      PT LONG TERM GOAL #4   Title Patient will report being able to sleep throughout the night without being awakened by her familiar pain.    Baseline 07/21/21: sleep interrupted throughout the night    Time 6    Period Weeks    Status On-going    Target Date 08/10/21                   Plan - 07/28/21 1119     Clinical Impression Statement Pt arrives for today's treatment session denying any pain.  Pt introduced to standing hip exercises to increase strength and function.  Pt requiring min cues for proper technique and posture as well as cues to avoid compensatory leaning with all standing exercises.  Pt does not experience any increase in pain with standing exercises.  Normal responses to estim and MH noted upon removal.  Pt denied any pain at completion of today's treamtent session.    Personal Factors and Comorbidities Comorbidity 1;Comorbidity 2;Other    Comorbidities Left femoral fracture (left LE a bit shorter), HTN, OP.    Examination-Activity Limitations Other;Locomotion Level    Examination-Participation Restrictions Other    Stability/Clinical Decision Making Stable/Uncomplicated    Rehab Potential Excellent    PT Frequency 2x / week    PT Duration 6 weeks    PT Treatment/Interventions ADLs/Self Care Home Management;Cryotherapy;Electrical Stimulation;Ultrasound;Moist Heat;Therapeutic activities;Therapeutic exercise;Manual techniques;Patient/family education;Passive range of motion    PT Next Visit Plan Combo e'stim/US, STW/M, core exercise progression, no spinal loading due to OP.    Consulted and Agree with Plan of Care Patient             Patient will benefit from skilled therapeutic intervention in order to improve the following deficits and impairments:  Pain, Decreased activity  tolerance, Postural dysfunction  Visit Diagnosis: Other low back pain     Problem List Patient Active Problem List   Diagnosis Date Noted   GAD (generalized anxiety disorder) 06/21/2018  Essential hypertension 10/23/2014   History of colonic polyps 06/27/2014   Underweight 05/30/2014   Vitamin D insufficiency 07/31/2013   Osteoporosis 10/31/2012   Hyperlipidemia 10/31/2012   Rationale for Evaluation and Treatment Rehabilitation  Kathrynn Ducking, PTA 07/28/2021, 12:10 PM  [facility] [address] [place], [place], [address] Phone: [phone]   Fax:  [phone]  Name: Robin [name] MRN: [id number] Date of Birth: [birth year]

## 2021-07-29 ENCOUNTER — Encounter: Payer: Self-pay | Admitting: Orthopaedic Surgery

## 2021-07-29 ENCOUNTER — Ambulatory Visit (INDEPENDENT_AMBULATORY_CARE_PROVIDER_SITE_OTHER): Payer: Medicare Other | Admitting: Orthopaedic Surgery

## 2021-07-29 VITALS — BP 163/89 | HR 76 | Ht 61.0 in | Wt 96.0 lb

## 2021-07-29 DIAGNOSIS — M25552 Pain in left hip: Secondary | ICD-10-CM | POA: Diagnosis not present

## 2021-07-29 DIAGNOSIS — M5432 Sciatica, left side: Secondary | ICD-10-CM | POA: Diagnosis not present

## 2021-07-29 NOTE — Progress Notes (Signed)
My back is a little better.  She is going to PT.  I have reviewed the notes. Her back pain is less but she still has some left sciatica to the left lateral foot, but it is less.  She has no new trauma, no weakness.  Spine/Pelvis examination:  Inspection:  Overall, sacoiliac joint benign and hips nontender; without crepitus or defects.   Thoracic spine inspection: Alignment normal without kyphosis present   Lumbar spine inspection:  Alignment  with normal lumbar lordosis, without scoliosis apparent.   Thoracic spine palpation:  without tenderness of spinal processes   Lumbar spine palpation: without tenderness of lumbar area; without tightness of lumbar muscles    Range of Motion:   Lumbar flexion, forward flexion is normal without pain or tenderness    Lumbar extension is full without pain or tenderness   Left lateral bend is normal without pain or tenderness   Right lateral bend is normal without pain or tenderness   Straight leg raising is normal  Strength & tone: normal   Stability overall normal stability  Encounter Diagnoses  Name Primary?   Sciatica, left side Yes   Left hip pain    Continue PT.  Return in five weeks.  Call if any problem.  Precautions discussed.  Electronically Signed Sanjuana Kava, MD 6/8/20239:32 AM

## 2021-07-30 ENCOUNTER — Ambulatory Visit: Payer: Medicare Other

## 2021-07-30 DIAGNOSIS — R293 Abnormal posture: Secondary | ICD-10-CM

## 2021-07-30 DIAGNOSIS — M5459 Other low back pain: Secondary | ICD-10-CM | POA: Diagnosis not present

## 2021-07-30 NOTE — Therapy (Signed)
[facility] [place], [place], [address] Phone: [phone]   Fax:  [phone]  Physical Therapy Treatment  Patient Details  Name: Robin [name] MRN: [id number] Date of Birth: May 19, 1940 Referring Provider (PT): Sanjuana Kava MD   Encounter Date: 07/30/2021   PT End of Session - 07/30/21 1114     Visit Number 7    Number of Visits 12    Date for PT Re-Evaluation 10/04/21    Authorization Type FOTO AT LEAST EVERY 5TH VISIT.  PROGRESS NOTE AT 10TH VISIT.  KX MODIFIER AFTER 15 VISITS.    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 Sharlett Iles: 5cm circumferential fungating tumor at the hepatic flexure, path tubulovillous adenoma.    COLONOSCOPY  01/02/2006   Dr. 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 Multiple polyps around anastomosis of different sizes,    COLONOSCOPY  02/21/2006   Dr. Sharlett Iles. Referred to Dr. Georgette Dover for resection   COLONOSCOPY N/A 07/18/2014   Procedure: COLONOSCOPY;  Surgeon: Daneil Dolin, MD;  [facility];  Service: Endoscopy;  Laterality: N/A;  1230   COLONOSCOPY WITH PROPOFOL N/A 04/22/2020   Procedure: COLONOSCOPY WITH PROPOFOL;  Surgeon: Rogene Houston, MD;  [facility];  Service: Endoscopy;  Laterality: N/A;  am   EYE SURGERY     LAPAROSCOPIC LYSIS OF ADHESIONS     PARTIAL  COLECTOMY  02/21/2005   due to tubulovillous adenoma   TUBAL LIGATION      There were no vitals filed for this visit.   Subjective Assessment - 07/30/21 1113     Subjective Pt arrives for today's treatmetn session reporting 10/10 left LE pain.    Pertinent History Left femoral fracture (left LE a bit shorter), HTN, OP.    How long can you sit comfortably? Unlimited.    How long can you stand comfortably? Varies.    How long can you walk comfortably? Varies.    Diagnostic tests X-rays.    Patient Stated Goals Get out of pain.    Currently in Pain? Yes    Pain Score 10-Worst pain ever    Pain Location Leg    Pain Orientation Left                               [facility] Adult PT Treatment/Exercise - 07/30/21 0001       Knee/Hip Exercises: Aerobic   Nustep Lvl 4 x 20 mins      Modalities   Modalities Electrical Stimulation;Moist Heat      Moist Heat Therapy   Number Minutes Moist Heat 15 Minutes    Moist Heat Location Lumbar Spine  Acupuncturist Location Left lumbar spine    Electrical Stimulation Action IFC    Electrical Stimulation Parameters 80-150 hz x 15 mins    Electrical Stimulation Goals Pain      Manual Therapy   Manual Therapy Soft tissue mobilization    Soft tissue mobilization STW/M to left glute and piriformis to decrease pain and tone                          PT Long Term Goals - 07/21/21 1358       PT LONG TERM GOAL #1   Title Patient will be independent with her HEP.    Time 6    Period Weeks    Status On-going    Target Date 08/10/21      PT LONG TERM GOAL #2   Title Patient will be able to complete her daily activities without her pain exceeding a 6/10.    Time 6    Period Weeks    Status On-going    Target Date 08/10/21      PT LONG TERM GOAL #3   Title Patient will be able to stand and walk for at least 30 minutes without being limited by her familiar pain for  improved function with cooking and other household activities.    Baseline 07/21/21: 15-20 mins before pain    Time 6    Period Weeks    Status On-going    Target Date 08/10/21      PT LONG TERM GOAL #4   Title Patient will report being able to sleep throughout the night without being awakened by her familiar pain.    Baseline 07/21/21: sleep interrupted throughout the night    Time 6    Period Weeks    Status On-going    Target Date 08/10/21                   Plan - 07/30/21 1117     Clinical Impression Statement Pt arrives for today's treatment session reporting 10/10 left LE pain.  STW/M performed to left piriformis and glute to decrease pain and tone with good results.  Normal responses to estim and MH noted upon removal.  Pt denied any pain at completion of today's treatment session.    Personal Factors and Comorbidities Comorbidity 1;Comorbidity 2;Other    Comorbidities Left femoral fracture (left LE a bit shorter), HTN, OP.    Examination-Activity Limitations Other;Locomotion Level    Examination-Participation Restrictions Other    Stability/Clinical Decision Making Stable/Uncomplicated    Rehab Potential Excellent    PT Frequency 2x / week    PT Duration 6 weeks    PT Treatment/Interventions ADLs/Self Care Home Management;Cryotherapy;Electrical Stimulation;Ultrasound;Moist Heat;Therapeutic activities;Therapeutic exercise;Manual techniques;Patient/family education;Passive range of motion    PT Next Visit Plan Combo e'stim/US, STW/M, core exercise progression, no spinal loading due to OP.    Consulted and Agree with Plan of Care Patient             Patient will benefit from skilled therapeutic intervention in order to improve the following deficits and impairments:  Pain, Decreased activity tolerance, Postural dysfunction  Visit Diagnosis: Other low back pain  Abnormal posture     Problem List Patient Active Problem List   Diagnosis Date Noted   GAD  (generalized anxiety disorder) 06/21/2018   Essential hypertension 10/23/2014   History of colonic polyps 06/27/2014   Underweight 05/30/2014   Vitamin  D insufficiency 07/31/2013   Osteoporosis 10/31/2012   Hyperlipidemia 10/31/2012   Rationale for Evaluation and Treatment Rehabilitation  Kathrynn Ducking, PTA 07/30/2021, 12:06 PM  [facility] [address] [place], [place], [address] Phone: [phone]   Fax:  [phone]  Name: LYLIE BLACKLOCK MRN: [id number] Date of Birth: 11-03-1940

## 2021-08-03 ENCOUNTER — Ambulatory Visit: Payer: Medicare Other

## 2021-08-03 DIAGNOSIS — R293 Abnormal posture: Secondary | ICD-10-CM

## 2021-08-03 DIAGNOSIS — M5459 Other low back pain: Secondary | ICD-10-CM | POA: Diagnosis not present

## 2021-08-03 NOTE — Therapy (Signed)
[facility] [place], [place], [address] Phone: [phone]   Fax:  [phone]  Physical Therapy Treatment  Patient Details  Name: Robin [name] MRN: [id number] Date of Birth: 12/27/1940 Referring Provider (PT): Sanjuana Kava MD   Encounter Date: 08/03/2021   PT End of Session - 08/03/21 1518     Visit Number 8    Number of Visits 12    Date for PT Re-Evaluation 10/04/21    Authorization Type FOTO AT LEAST EVERY 5TH VISIT.  PROGRESS NOTE AT 10TH VISIT.  KX MODIFIER AFTER 15 VISITS.    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 Sharlett Iles: 5cm circumferential fungating tumor at the hepatic flexure, path tubulovillous adenoma.    COLONOSCOPY  01/02/2006   Dr. 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 Multiple polyps around anastomosis of different sizes,    COLONOSCOPY  02/21/2006   Dr. Sharlett Iles. Referred to Dr. Georgette Dover for resection   COLONOSCOPY N/A 07/18/2014   Procedure: COLONOSCOPY;  Surgeon: Daneil Dolin, MD;  [facility];  Service: Endoscopy;  Laterality: N/A;  1230   COLONOSCOPY WITH PROPOFOL N/A 04/22/2020   Procedure: COLONOSCOPY WITH PROPOFOL;  Surgeon: Rogene Houston, MD;  [facility];  Service: Endoscopy;  Laterality: N/A;  am   EYE SURGERY     LAPAROSCOPIC LYSIS OF ADHESIONS     PARTIAL  COLECTOMY  02/21/2005   due to tubulovillous adenoma   TUBAL LIGATION      There were no vitals filed for this visit.   Subjective Assessment - 08/03/21 1517     Subjective Pt arrives for today's treatment session reporting 8/10 left LE pain from hip radiating down to her left foot.    Pertinent History Left femoral fracture (left LE a bit shorter), HTN, OP.    How long can you sit comfortably? Unlimited.    How long can you stand comfortably? Varies.    How long can you walk comfortably? Varies.    Diagnostic tests X-rays.    Patient Stated Goals Get out of pain.    Currently in Pain? Yes    Pain Score 8     Pain Location Leg    Pain Orientation Left;Upper;Mid;Lower;Lateral                               [facility] Adult PT Treatment/Exercise - 08/03/21 0001       Knee/Hip Exercises: Aerobic   Nustep Lvl 4 x 15 mins      Modalities   Modalities Electrical Stimulation;Moist Heat      Moist Heat Therapy   Number Minutes Moist Heat 15 Minutes    Moist Heat  Location Lumbar Spine      Electrical Stimulation   Electrical Stimulation Location Left lumbar spine    Electrical Stimulation Action IFC    Electrical Stimulation Parameters 80-150 Hz x 15 mins    Electrical Stimulation Goals Pain      Manual Therapy   Manual Therapy Soft tissue mobilization    Soft tissue mobilization STW/M to left glute, piriformis, and IT to decrease pain and tone with pt positioned in right side-lying                          PT Long Term Goals - 07/21/21 1358       PT LONG TERM GOAL #1   Title Patient will be independent with her HEP.    Time 6    Period Weeks    Status On-going    Target Date 08/10/21      PT LONG TERM GOAL #2   Title Patient will be able to complete her daily activities without her pain exceeding a 6/10.    Time 6    Period Weeks    Status On-going    Target Date 08/10/21      PT LONG TERM GOAL #3   Title Patient will be able to  stand and walk for at least 30 minutes without being limited by her familiar pain for improved function with cooking and other household activities.    Baseline 07/21/21: 15-20 mins before pain    Time 6    Period Weeks    Status On-going    Target Date 08/10/21      PT LONG TERM GOAL #4   Title Patient will report being able to sleep throughout the night without being awakened by her familiar pain.    Baseline 07/21/21: sleep interrupted throughout the night    Time 6    Period Weeks    Status On-going    Target Date 08/10/21                   Plan - 08/03/21 1519     Clinical Impression Statement Pt arrives for today's treatment session reporting 8/10 lateral LE pain originating in left low back and radiating down to her left foot.  STW/M performed to left glute, piriformis, IT band, and left lumbar paraspinals to decrease pain and tone.  Pt positioned in right side-lying for comfort with pillow between her knees.  Normal responses to estim and MH noted upon removal.  Pt denied any pain at completion of today's treatment session.    Personal Factors and Comorbidities Comorbidity 1;Comorbidity 2;Other    Comorbidities Left femoral fracture (left LE a bit shorter), HTN, OP.    Examination-Activity Limitations Other;Locomotion Level    Examination-Participation Restrictions Other    Stability/Clinical Decision Making Stable/Uncomplicated    Rehab Potential Excellent    PT Frequency 2x / week    PT Duration 6 weeks    PT Treatment/Interventions ADLs/Self Care Home Management;Cryotherapy;Electrical Stimulation;Ultrasound;Moist Heat;Therapeutic activities;Therapeutic exercise;Manual techniques;Patient/family education;Passive range of motion    PT Next Visit Plan Combo e'stim/US, STW/M, core exercise progression, no spinal loading due to OP.    Consulted and Agree with Plan of Care Patient             Patient will benefit from skilled therapeutic intervention in order to  improve the following deficits and impairments:  Pain, Decreased activity tolerance, Postural dysfunction  Visit Diagnosis: Other low back pain  Abnormal  posture     Problem List Patient Active Problem List   Diagnosis Date Noted   GAD (generalized anxiety disorder) 06/21/2018   Essential hypertension 10/23/2014   History of colonic polyps 06/27/2014   Underweight 05/30/2014   Vitamin D insufficiency 07/31/2013   Osteoporosis 10/31/2012   Hyperlipidemia 10/31/2012   Rationale for Evaluation and Treatment Rehabilitation  Kathrynn Ducking, PTA 08/03/2021, 4:16 PM  [facility] [address] [place], [place], [address] Phone: [phone]   Fax:  [phone]  Name: Robin [name] MRN: [id number] Date of Birth: [birth year]

## 2021-08-05 ENCOUNTER — Ambulatory Visit: Payer: Medicare Other

## 2021-08-05 DIAGNOSIS — R293 Abnormal posture: Secondary | ICD-10-CM

## 2021-08-05 DIAGNOSIS — M5459 Other low back pain: Secondary | ICD-10-CM | POA: Diagnosis not present

## 2021-08-05 NOTE — Therapy (Signed)
[facility] [place], [place], [address] Phone: [phone]   Fax:  [phone]  Physical Therapy Treatment  Patient Details  Name: Robin [name] MRN: [id number] Date of Birth: 06-Jun-1940 Referring Provider (PT): Sanjuana Kava MD   Encounter Date: 08/05/2021   PT End of Session - 08/05/21 1435     Visit Number 9    Number of Visits 12    Date for PT Re-Evaluation 10/04/21    Authorization Type FOTO AT LEAST EVERY 5TH VISIT.  PROGRESS NOTE AT 10TH VISIT.  KX MODIFIER AFTER 15 VISITS.    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 Sharlett Iles: 5cm circumferential fungating tumor at the hepatic flexure, path tubulovillous adenoma.    COLONOSCOPY  01/02/2006   Dr. 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 Multiple polyps around anastomosis of different sizes,    COLONOSCOPY  02/21/2006   Dr. Sharlett Iles. Referred to Dr. Georgette Dover for resection   COLONOSCOPY N/A 07/18/2014   Procedure: COLONOSCOPY;  Surgeon: Daneil Dolin, MD;  [facility];  Service: Endoscopy;  Laterality: N/A;  1230   COLONOSCOPY WITH PROPOFOL N/A 04/22/2020   Procedure: COLONOSCOPY WITH PROPOFOL;  Surgeon: Rogene Houston, MD;  [facility];  Service: Endoscopy;  Laterality: N/A;  am   EYE SURGERY     LAPAROSCOPIC LYSIS OF ADHESIONS     PARTIAL  COLECTOMY  02/21/2005   due to tubulovillous adenoma   TUBAL LIGATION      There were no vitals filed for this visit.   Subjective Assessment - 08/05/21 1434     Subjective Pt arrives for today's treatment session reporting 6/10 left LE pain from hip radiating down to her left foot.    Pertinent History Left femoral fracture (left LE a bit shorter), HTN, OP.    How long can you sit comfortably? Unlimited.    How long can you stand comfortably? Varies.    How long can you walk comfortably? Varies.    Diagnostic tests X-rays.    Patient Stated Goals Get out of pain.    Currently in Pain? Yes    Pain Score 6     Pain Location Leg    Pain Orientation Left;Upper;Mid;Lower;Lateral                               [facility] Adult PT Treatment/Exercise - 08/05/21 0001       Knee/Hip Exercises: Aerobic   Nustep Lvl 5 x 15 mins      Modalities   Modalities Electrical Stimulation;Moist Heat      Moist Heat Therapy   Number Minutes Moist Heat 15 Minutes    Moist Heat  Location Lumbar Spine      Electrical Stimulation   Electrical Stimulation Location Left lumbar spine    Electrical Stimulation Action IFC    Electrical Stimulation Parameters 80-150 Hz x 15 mins    Electrical Stimulation Goals Pain      Manual Therapy   Manual Therapy Soft tissue mobilization;Passive ROM    Soft tissue mobilization STW/M to left glute, piriformis, and IT to decrease pain and tone with pt positioned in right side-lying    Passive ROM Passive gastroc stretch to decrease pain and tone in left LE                          PT Long Term Goals - 07/21/21 1358       PT LONG TERM GOAL #1   Title Patient will be independent with her HEP.    Time 6    Period Weeks    Status On-going    Target Date 08/10/21      PT LONG TERM GOAL #2   Title Patient will be able to complete her daily activities without her pain exceeding a 6/10.    Time 6    Period Weeks    Status  On-going    Target Date 08/10/21      PT LONG TERM GOAL #3   Title Patient will be able to stand and walk for at least 30 minutes without being limited by her familiar pain for improved function with cooking and other household activities.    Baseline 07/21/21: 15-20 mins before pain    Time 6    Period Weeks    Status On-going    Target Date 08/10/21      PT LONG TERM GOAL #4   Title Patient will report being able to sleep throughout the night without being awakened by her familiar pain.    Baseline 07/21/21: sleep interrupted throughout the night    Time 6    Period Weeks    Status On-going    Target Date 08/10/21                   Plan - 08/05/21 1435     Clinical Impression Statement Pt arrives for today's treatment session reporting 6/10 left LE pain. Pt reports that pain more focused around her left ankle and calf.  No redness, swelling, or heat noted.  STW/M performed to lefter glute, piriformis, IT and left lumbar paraspinals to decrease pain and tone.  Passive stretch performed to left gastroc to decrease pain and tone.  Normal responses noted to estim and MH upon removal.  Pt reported 2/10 left LE pain at completion of today's treatment session.    Personal Factors and Comorbidities Comorbidity 1;Comorbidity 2;Other    Comorbidities Left femoral fracture (left LE a bit shorter), HTN, OP.    Examination-Activity Limitations Other;Locomotion Level    Examination-Participation Restrictions Other    Stability/Clinical Decision Making Stable/Uncomplicated    Rehab Potential Excellent    PT Frequency 2x / week    PT Duration 6 weeks    PT Treatment/Interventions ADLs/Self Care Home Management;Cryotherapy;Electrical Stimulation;Ultrasound;Moist Heat;Therapeutic activities;Therapeutic exercise;Manual techniques;Patient/family education;Passive range of motion    PT Next Visit Plan Combo e'stim/US, STW/M, core exercise progression, no spinal loading due to OP.    Consulted  and Agree with Plan of Care Patient             Patient will benefit from skilled therapeutic intervention in  order to improve the following deficits and impairments:  Pain, Decreased activity tolerance, Postural dysfunction  Visit Diagnosis: Other low back pain  Abnormal posture     Problem List Patient Active Problem List   Diagnosis Date Noted   GAD (generalized anxiety disorder) 06/21/2018   Essential hypertension 10/23/2014   History of colonic polyps 06/27/2014   Underweight 05/30/2014   Vitamin D insufficiency 07/31/2013   Osteoporosis 10/31/2012   Hyperlipidemia 10/31/2012   Rationale for Evaluation and Treatment Rehabilitation  Kathrynn Ducking, PTA 08/05/2021, 3:28 PM  [facility] [address] [place], [place], [address] Phone: [phone]   Fax:  [phone]  Name: Robin [name] MRN: [id number] Date of Birth: [birth year]

## 2021-08-10 ENCOUNTER — Ambulatory Visit: Payer: Medicare Other | Admitting: *Deleted

## 2021-08-10 DIAGNOSIS — M5459 Other low back pain: Secondary | ICD-10-CM | POA: Diagnosis not present

## 2021-08-10 DIAGNOSIS — R293 Abnormal posture: Secondary | ICD-10-CM

## 2021-08-10 NOTE — Therapy (Signed)
[facility] [place], [place], [address] Phone: [phone]   Fax:  [phone]  Physical Therapy Treatment  Patient Details  Name: Robin [name] MRN: [id number] Date of Birth: 01/06/1941 Referring Provider (PT): Sanjuana Kava MD   Encounter Date: 08/10/2021   PT End of Session - 08/10/21 1440     Visit Number 10    Number of Visits 12    Date for PT Re-Evaluation 10/04/21    Authorization Type FOTO AT LEAST EVERY 5TH VISIT.  PROGRESS NOTE AT 10TH VISIT.  KX MODIFIER AFTER 15 VISITS.    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 Sharlett Iles: 5cm circumferential fungating tumor at the hepatic flexure, path tubulovillous adenoma.    COLONOSCOPY  01/02/2006   Dr. 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 Multiple polyps around anastomosis of different sizes,    COLONOSCOPY  02/21/2006   Dr. Sharlett Iles. Referred to Dr. Georgette Dover for resection   COLONOSCOPY N/A 07/18/2014   Procedure: COLONOSCOPY;  Surgeon: Daneil Dolin, MD;  [facility];  Service: Endoscopy;  Laterality: N/A;  1230   COLONOSCOPY WITH PROPOFOL N/A 04/22/2020   Procedure: COLONOSCOPY WITH PROPOFOL;  Surgeon: Rogene Houston, MD;  [facility];  Service: Endoscopy;  Laterality: N/A;  am   EYE SURGERY     LAPAROSCOPIC LYSIS OF ADHESIONS     PARTIAL  COLECTOMY  02/21/2005   due to tubulovillous adenoma   TUBAL LIGATION      There were no vitals filed for this visit.   Subjective Assessment - 08/10/21 1434     Subjective Pt arrives for today's treatment session reporting 3-4/10 left LE pain 20% better    Pertinent History Left femoral fracture (left LE a bit shorter), HTN, OP.    How long can you sit comfortably? Unlimited.    How long can you stand comfortably? Varies.    How long can you walk comfortably? Varies.    Diagnostic tests X-rays.    Patient Stated Goals Get out of pain.    Currently in Pain? Yes    Pain Score 6     Pain Location Leg    Pain Orientation Left;Upper    Pain Descriptors / Indicators Shooting    Pain Type Acute pain    Pain Onset More than a month ago                               [facility] Adult PT Treatment/Exercise - 08/10/21 0001       Knee/Hip Exercises: Aerobic   Nustep Lvl 5 x 15 mins      Knee/Hip Exercises: Sidelying   Clams LT hip 3x10  given for HEP      Modalities   Modalities Electrical Stimulation;Moist Heat;Ultrasound      Moist Heat Therapy   Number Minutes Moist Heat 15 Minutes    Moist Heat Location Lumbar Spine      Electrical Stimulation   Electrical Stimulation Location Left lumbar spine    Electrical Stimulation Action IFC    Electrical Stimulation Parameters 80-'150hz'  x 15 mins    Electrical Stimulation Goals Pain      Ultrasound   Ultrasound Location LT glute    Ultrasound Parameters 1.5 w/cm2 x 10 mins    Ultrasound Goals Pain                          PT Long Term Goals - 08/10/21 1441       PT LONG TERM GOAL #1   Title Patient will be independent with her HEP.    Time 6    Period Weeks    Status Partially Met    Target Date 08/10/21      PT LONG TERM GOAL #2   Title Patient will be able to complete her daily activities without her pain exceeding a 6/10.    Baseline 4/10 08-10-21    Time 6    Period Weeks    Status  Partially Met    Target Date 08/10/21      PT LONG TERM GOAL #3   Title Patient will be able to stand and walk for at least 30 minutes without being limited by her familiar pain for improved function with cooking and other household activities.    Time 6    Period Weeks    Status On-going      PT LONG TERM GOAL #4   Title Patient will report being able to sleep throughout the night without being awakened by her familiar pain.    Baseline 07/21/21: sleep interrupted throughout the night    Time 6    Period Weeks    Status On-going    Target Date 08/10/21                   Plan - 08/10/21 1440     Clinical Impression Statement Pt arrived today doing better with decreased pain3-4/10 LT hip. She was able to perform some exs without increased pain and sidelying ER was given for HEP. [place] combo also performed today f/b estim and pain 2-3/10 end of session. Pt has partially met 2 LTGs at this point.    Personal Factors and Comorbidities Comorbidity 1;Comorbidity 2;Other    Comorbidities Left femoral fracture (left LE a bit shorter), HTN, OP.    Examination-Activity Limitations Other;Locomotion Level    Examination-Participation Restrictions Other    Stability/Clinical Decision Making Stable/Uncomplicated    Rehab Potential Excellent    PT Treatment/Interventions ADLs/Self Care Home Management;Cryotherapy;Electrical Stimulation;Ultrasound;Moist Heat;Therapeutic activities;Therapeutic exercise;Manual techniques;Patient/family education;Passive range of motion    PT Next Visit Plan Combo e'stim/US, STW/M, core exercise progression, no spinal loading due to OP.    Consulted and Agree with Plan of Care Patient             Patient will benefit from skilled therapeutic intervention in order to improve the following deficits and impairments:  Pain, Decreased activity tolerance, Postural dysfunction  Visit Diagnosis: Other low back pain  Abnormal posture     Problem  List Patient Active Problem List   Diagnosis Date Noted   GAD (generalized anxiety disorder) 06/21/2018  Essential hypertension 10/23/2014   History of colonic polyps 06/27/2014   Underweight 05/30/2014   Vitamin D insufficiency 07/31/2013   Osteoporosis 10/31/2012   Hyperlipidemia 10/31/2012  Rationale for Evaluation and Treatment Rehabilitation   Robin [name],Robin [name], PTA 08/10/2021, 4:53 PM  [facility] [address] [place], [place], [address] Phone: [phone]   Fax:  [phone]  Name: Robin [name] MRN: [id number] Date of Birth: [birth year]   Progress Note Reporting Period 07/05/21 to 08/10/21.  See note below for Objective Data and Assessment of Progress/Goals. Good progress toward goals.    Robin [name] MPT

## 2021-08-12 ENCOUNTER — Ambulatory Visit: Payer: Medicare Other | Admitting: Physical Therapy

## 2021-08-12 DIAGNOSIS — R293 Abnormal posture: Secondary | ICD-10-CM | POA: Diagnosis not present

## 2021-08-12 DIAGNOSIS — M5459 Other low back pain: Secondary | ICD-10-CM | POA: Diagnosis not present

## 2021-08-12 NOTE — Therapy (Signed)
[facility] [place], [place], [address] Phone: [phone]   Fax:  [phone]  Physical Therapy Treatment  Patient Details  Name: Robin [name] MRN: [id number] Date of Birth: 1940/02/25 Referring Provider (PT): Sanjuana Kava MD   Encounter Date: 08/12/2021   PT End of Session - 08/12/21 1415     Visit Number 11    Number of Visits 12    Date for PT Re-Evaluation 10/04/21    Authorization Type FOTO AT LEAST EVERY 5TH VISIT.  PROGRESS NOTE AT 10TH VISIT.  KX MODIFIER AFTER 15 VISITS.    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 Sharlett Iles: 5cm circumferential fungating tumor at the hepatic flexure, path tubulovillous adenoma.    COLONOSCOPY  01/02/2006   Dr. 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 Multiple polyps around anastomosis of different sizes,    COLONOSCOPY  02/21/2006   Dr. Sharlett Iles. Referred to Dr. Georgette Dover for resection   COLONOSCOPY N/A 07/18/2014   Procedure: COLONOSCOPY;  Surgeon: Daneil Dolin, MD;  [facility];  Service: Endoscopy;  Laterality: N/A;  1230   COLONOSCOPY WITH PROPOFOL N/A 04/22/2020   Procedure: COLONOSCOPY WITH PROPOFOL;  Surgeon: Rogene Houston, MD;  [facility];  Service: Endoscopy;  Laterality: N/A;  am   EYE  SURGERY     LAPAROSCOPIC LYSIS OF ADHESIONS     PARTIAL COLECTOMY  02/21/2005   due to tubulovillous adenoma   TUBAL LIGATION      There were no vitals filed for this visit.   Subjective Assessment - 08/12/21 1416     Subjective Low pain-level    Pertinent History Left femoral fracture (left LE a bit shorter), HTN, OP.    How long can you sit comfortably? Unlimited.    How long can you stand comfortably? Varies.    How long can you walk comfortably? Varies.    Patient Stated Goals Get out of pain.    Currently in Pain? Yes    Pain Score 3     Pain Location Buttocks    Pain Orientation Left    Pain Descriptors / Indicators Shooting    Pain Type Acute pain    Pain Onset More than a month ago                               [facility] Adult PT Treatment/Exercise - 08/12/21 0001       Knee/Hip Exercises: Aerobic   Nustep Level 4 x 15 minutes.      Moist Heat Therapy   Number Minutes Moist Heat 20 Minutes    Moist Heat  Location Lumbar Spine      Electrical Stimulation   Electrical Stimulation Location Left gluteal region    Electrical Stimulation Action IFC at 80-150 Hz.    Electrical Stimulation Parameters 40% scan x 20 minutes.    Electrical Stimulation Goals Pain      Manual Therapy   Manual Therapy Soft tissue mobilization    Soft tissue mobilization STW/M x 8 minutes to patient's left gluteal region.                          PT Long Term Goals - 08/10/21 1441       PT LONG TERM GOAL #1   Title Patient will be independent with her HEP.    Time 6    Period Weeks    Status Partially Met    Target Date 08/10/21      PT LONG TERM GOAL #2   Title Patient will be able to complete her daily activities without her pain exceeding a 6/10.    Baseline 4/10 08-10-21    Time 6    Period Weeks    Status Partially Met    Target Date 08/10/21      PT LONG TERM GOAL #3   Title Patient will be able to stand and walk for at least 30 minutes  without being limited by her familiar pain for improved function with cooking and other household activities.    Time 6    Period Weeks    Status On-going      PT LONG TERM GOAL #4   Title Patient will report being able to sleep throughout the night without being awakened by her familiar pain.    Baseline 07/21/21: sleep interrupted throughout the night    Time 6    Period Weeks    Status On-going    Target Date 08/10/21                   Plan - 08/12/21 1438     Clinical Impression Statement Excellent response to treatments.  No pain reported after treatment today.  Normal modality response following removal of modallity.    Personal Factors and Comorbidities Comorbidity 1;Comorbidity 2;Other    Comorbidities Left femoral fracture (left LE a bit shorter), HTN, OP.    Examination-Activity Limitations Other;Locomotion Level    Examination-Participation Restrictions Other    Stability/Clinical Decision Making Stable/Uncomplicated    Rehab Potential Excellent    PT Frequency 2x / week    PT Duration 6 weeks    PT Treatment/Interventions ADLs/Self Care Home Management;Cryotherapy;Electrical Stimulation;Ultrasound;Moist Heat;Therapeutic activities;Therapeutic exercise;Manual techniques;Patient/family education;Passive range of motion    PT Next Visit Plan Combo e'stim/US, STW/M, core exercise progression, no spinal loading due to OP.    Consulted and Agree with Plan of Care Patient             Patient will benefit from skilled therapeutic intervention in order to improve the following deficits and impairments:     Visit Diagnosis: Other low back pain  Abnormal posture     Problem List Patient Active Problem List   Diagnosis Date Noted   GAD (generalized anxiety disorder) 06/21/2018   Essential hypertension 10/23/2014   History of colonic polyps 06/27/2014   Underweight 05/30/2014   Vitamin D insufficiency 07/31/2013   Osteoporosis 10/31/2012   Hyperlipidemia  10/31/2012   Rationale for Evaluation and Treatment Rehabilitation.  Nashley Cordoba, [place], PT 08/12/2021, 2:41 PM  [facility] [place], [place], [address] Phone: [phone]   Fax:  [phone]  Name: Robin [name] MRN: [id number] Date of Birth: Oct 22, 1940

## 2021-08-17 DIAGNOSIS — H905 Unspecified sensorineural hearing loss: Secondary | ICD-10-CM | POA: Diagnosis not present

## 2021-08-18 ENCOUNTER — Encounter: Payer: Self-pay | Admitting: Physical Therapy

## 2021-08-18 ENCOUNTER — Ambulatory Visit: Payer: Medicare Other | Admitting: Physical Therapy

## 2021-08-18 DIAGNOSIS — R293 Abnormal posture: Secondary | ICD-10-CM | POA: Diagnosis not present

## 2021-08-18 DIAGNOSIS — M5459 Other low back pain: Secondary | ICD-10-CM

## 2021-08-18 NOTE — Therapy (Signed)
[facility] [place], [place], [address] Phone: [phone]   Fax:  [phone]  Physical Therapy Treatment  Patient Details  Name: Robin [name] MRN: [id number] Date of Birth: 1941-02-07 Referring Provider (PT): Sanjuana Kava MD   Encounter Date: 08/18/2021   PT End of Session - 08/18/21 1133     Visit Number 12    Number of Visits 12    Date for PT Re-Evaluation 10/04/21    Authorization Type FOTO AT LEAST EVERY 5TH VISIT.  PROGRESS NOTE AT 10TH VISIT.  KX MODIFIER AFTER 15 VISITS.    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 Sharlett Iles: 5cm circumferential fungating tumor at the hepatic flexure, path tubulovillous adenoma.    COLONOSCOPY  01/02/2006   Dr. 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 Multiple polyps around anastomosis of different sizes,    COLONOSCOPY  02/21/2006   Dr. Sharlett Iles. Referred to Dr. Georgette Dover for resection   COLONOSCOPY N/A 07/18/2014   Procedure: COLONOSCOPY;  Surgeon: Daneil Dolin, MD;  [facility];  Service: Endoscopy;  Laterality: N/A;  1230   COLONOSCOPY WITH PROPOFOL N/A 04/22/2020   Procedure: COLONOSCOPY WITH PROPOFOL;  Surgeon: Rogene Houston, MD;  [facility];  Service: Endoscopy;  Laterality: N/A;  am   EYE SURGERY     LAPAROSCOPIC LYSIS OF ADHESIONS     PARTIAL COLECTOMY  02/21/2005   due to tubulovillous adenoma   TUBAL LIGATION      There were no vitals filed for this visit.   Subjective Assessment - 08/18/21 1115     Subjective "Its not too bad today" but rated pain as 10/10.    Pertinent History Left femoral fracture (left LE a bit shorter), HTN, OP.    How long can you sit comfortably? Unlimited.    How long can you stand comfortably? Varies.    How long can you walk comfortably? Varies.    Diagnostic tests X-rays.    Patient Stated Goals Get out of pain.    Currently in Pain? Yes    Pain Score 10-Worst pain ever    Pain Location Buttocks    Pain Orientation Left    Pain Descriptors / Indicators Discomfort    Pain Type Acute pain    Pain Onset More than a month ago    Pain Frequency Constant                [facility] PT Assessment - 08/18/21 0001       Assessment   Medical Diagnosis Sciatica of left side.    Referring Provider (PT) Sanjuana Kava MD  [facility] Adult PT Treatment/Exercise - 08/18/21 0001       Knee/Hip Exercises: Aerobic   Nustep Level 3 x 17 minutes.      Modalities   Modalities Electrical Stimulation;Moist Heat      Moist Heat Therapy   Number Minutes Moist Heat 10 Minutes    Moist Heat Location Lumbar Spine      Electrical Stimulation   Electrical Stimulation Location Left gluteal region    Electrical Stimulation Action Pre-Mod    Electrical Stimulation Parameters 80-150 hz x61mn    Electrical Stimulation Goals Pain      Manual Therapy   Manual Therapy Soft tissue mobilization    Soft tissue mobilization STW to L buttock/piriformis to reduce pain                          PT Long Term Goals - 08/18/21 1210       PT LONG TERM GOAL #1   Title Patient will be independent with her HEP.    Time 6    Period Weeks    Status  Achieved    Target Date 08/10/21      PT LONG TERM GOAL #2   Title Patient will be able to complete her daily activities without her pain exceeding a 6/10.    Baseline 4/10 08-10-21    Time 6    Period Weeks    Status Partially Met    Target Date 08/10/21      PT LONG TERM GOAL #3   Title Patient will be able to stand and walk for at least 30 minutes without being limited by her familiar pain for improved function with cooking and other household activities.    Time 6    Period Weeks    Status Partially Met      PT LONG TERM GOAL #4   Title Patient will report being able to sleep throughout the night without being awakened by her familiar pain.    Baseline 07/21/21: sleep interrupted throughout the night    Time 6    Period Weeks    Status Unable to assess    Target Date 08/10/21                   Plan - 08/18/21 1205     Clinical Impression Statement Patient presented in clinic reporting that buttock pain as not that bad but numerically rated it as 10/10. Patient indicating tenderness and soreness to manual therapy to L buttock and piriformis but minimal tone palpable. Normal modalities response noted following removal of the modalities. Patient encouraged to continue stretching as instructed in PT sessions which she stated she was already compliant with.    Personal Factors and Comorbidities Comorbidity 1;Comorbidity 2;Other    Comorbidities Left femoral fracture (left LE a bit shorter), HTN, OP.    Examination-Activity Limitations Other;Locomotion Level    Examination-Participation Restrictions Other    Stability/Clinical Decision Making Stable/Uncomplicated    Rehab Potential Excellent    PT Frequency 2x / week    PT Duration 6 weeks    PT Treatment/Interventions ADLs/Self Care Home Management;Cryotherapy;Electrical Stimulation;Ultrasound;Moist Heat;Therapeutic activities;Therapeutic exercise;Manual techniques;Patient/family education;Passive range of motion    PT  Next Visit Plan DC    Consulted and Agree with Plan of Care Patient             Patient will benefit from skilled therapeutic intervention in order to improve the following deficits and impairments:  Pain, Decreased activity tolerance, Postural dysfunction  Visit Diagnosis: Other low back pain  Abnormal posture     Problem List Patient Active Problem List   Diagnosis Date Noted   GAD (generalized anxiety disorder) 06/21/2018   Essential hypertension 10/23/2014   History of colonic polyps 06/27/2014   Underweight 05/30/2014   Vitamin D insufficiency 07/31/2013   Osteoporosis 10/31/2012   Hyperlipidemia 10/31/2012   Rationale for Evaluation and Treatment Rehabilitation   Standley Brooking, [place] 08/18/2021, 12:11 PM  [facility] [address] [place], [place], [address] Phone: [phone]   Fax:  [phone]  Name: Robin [name] MRN: [id number] Date of Birth: Nov 27, 1940

## 2021-08-23 DIAGNOSIS — S90211A Contusion of right great toe with damage to nail, initial encounter: Secondary | ICD-10-CM | POA: Diagnosis not present

## 2021-08-23 DIAGNOSIS — S99921A Unspecified injury of right foot, initial encounter: Secondary | ICD-10-CM | POA: Diagnosis not present

## 2021-09-01 ENCOUNTER — Ambulatory Visit (INDEPENDENT_AMBULATORY_CARE_PROVIDER_SITE_OTHER): Payer: Medicare Other | Admitting: Orthopaedic Surgery

## 2021-09-01 ENCOUNTER — Encounter: Payer: Self-pay | Admitting: Orthopaedic Surgery

## 2021-09-01 DIAGNOSIS — M5432 Sciatica, left side: Secondary | ICD-10-CM | POA: Diagnosis not present

## 2021-09-01 NOTE — Progress Notes (Signed)
I am much better.  She has been to PT and is much improved.  She has little pain, she is walking well.  NV intact, back negative, no pain.  Encounter Diagnosis  Name Primary?   Sciatica, left side Yes   I will see prn.  Call if any problem.  Precautions discussed.  Electronically Signed Sanjuana Kava, MD 7/12/20239:20 AM

## 2021-09-14 ENCOUNTER — Other Ambulatory Visit: Payer: Self-pay | Admitting: *Deleted

## 2021-09-14 MED ORDER — PROLIA 60 MG/ML ~~LOC~~ SOSY
PREFILLED_SYRINGE | SUBCUTANEOUS | 0 refills | Status: DC
Start: 2021-09-14 — End: 2022-04-08

## 2021-09-27 ENCOUNTER — Telehealth: Payer: Self-pay | Admitting: *Deleted

## 2021-09-27 NOTE — Telephone Encounter (Signed)
Pt returned missed call. Scheduled pt for Prolia injection on 10/11/21. Pt has not picked up her Rx at the pharmacy yet but says she will and will keep it in the refrigerator until her appt.

## 2021-09-27 NOTE — Telephone Encounter (Signed)
LM for pt to call me to discuss PROLIA   She is due and I sent RX for prolia to [facility] on 7/25.   Pt has not called me back yet - LM again today with son, need to find out IF she picked up? Is it in fridge, if so? AND get her sched for injection  -JHB

## 2021-09-27 NOTE — Telephone Encounter (Signed)
She is due on 10/11/21.

## 2021-10-11 ENCOUNTER — Ambulatory Visit (INDEPENDENT_AMBULATORY_CARE_PROVIDER_SITE_OTHER): Payer: Medicare Other

## 2021-10-11 DIAGNOSIS — M810 Age-related osteoporosis without current pathological fracture: Secondary | ICD-10-CM | POA: Diagnosis not present

## 2021-10-11 MED ORDER — DENOSUMAB 60 MG/ML ~~LOC~~ SOSY
60.0000 mg | PREFILLED_SYRINGE | Freq: Once | SUBCUTANEOUS | Status: AC
  Administered 2021-10-11: 60 mg via SUBCUTANEOUS

## 2021-10-11 NOTE — Progress Notes (Signed)
Prolia injection given to left upper arm Patient tolerated well Patient supplied

## 2021-10-19 ENCOUNTER — Telehealth: Payer: Self-pay | Admitting: *Deleted

## 2021-10-19 NOTE — Patient Outreach (Signed)
  Care Coordination   Initial Visit Note   10/19/2021 Name: GERIANN LAFONT MRN: [id number] DOB: [birth year]  ANNALEI FRIESZ is a 82 y.o. year old female who sees Chevis Pretty, [place] for primary care. I spoke with  [name] by phone today.  What matters to the patients health and wellness today? Appropriate foods to eat for hypertension. I am on Prolia and I don't think it is helping. What other medications can I take rather than the injections.    Goals Addressed             This Visit's Progress    Develop Plan Of Care For Management of HTN          SDOH assessments and interventions completed:  Yes     Care Coordination Interventions Activated:  Yes  Care Coordination Interventions:  Yes, provided   Follow up plan: Follow up call scheduled for Joellyn Quails   [id number] 1:30  Encounter Outcome:  Pt. Visit Completed   [place] Management [phone]

## 2021-10-28 DIAGNOSIS — G8929 Other chronic pain: Secondary | ICD-10-CM | POA: Diagnosis not present

## 2021-10-28 DIAGNOSIS — M25552 Pain in left hip: Secondary | ICD-10-CM | POA: Diagnosis not present

## 2021-10-29 ENCOUNTER — Ambulatory Visit (INDEPENDENT_AMBULATORY_CARE_PROVIDER_SITE_OTHER): Payer: Medicare Other | Admitting: Pharmacist

## 2021-10-29 ENCOUNTER — Telehealth: Payer: Self-pay | Admitting: Pharmacist

## 2021-10-29 DIAGNOSIS — M810 Age-related osteoporosis without current pathological fracture: Secondary | ICD-10-CM

## 2021-10-29 NOTE — Telephone Encounter (Signed)
  PharmD Medication Management   10/29/2021 Name: [name] MRN: [id number] DOB: March 21, 1940   Referred by: Chevis Pretty, FNP Reason for referral : Medication Management (prolia)   An unsuccessful telephone outreach was attempted today. The patient was referred to the case management team for assistance with care management and care coordination.   Follow Up Plan: Telephone follow up appointment with care management team member scheduled for:2 weeks    Regina Eck, PharmD, BCPS Clinical Pharmacist, [place]  [facility] Phone [phone]

## 2021-11-09 ENCOUNTER — Ambulatory Visit: Payer: Self-pay | Admitting: *Deleted

## 2021-11-09 NOTE — Patient Outreach (Signed)
  Care Coordination   Initial Visit Note   11/09/2021 Name: Robin [name] MRN: [id number] DOB: 26-Feb-1940  Robin [name] is a 81 y.o. year old female who sees Chevis Pretty, [place] for primary care. I spoke with  [name] by phone today.  What matters to the patients health and wellness today?  Hypertension-monitors at home only lisinopril  Appropriate foods to eat for hypertension. I am on Prolia and I don't think it is helping. What other medications can I take rather than the injections.  Left hip pain ortho PT still have pain   Under weight resources for ensure coupons   Goals Addressed   None     SDOH assessments and interventions completed:  Yes     Care Coordination Interventions Activated:  Yes  Care Coordination Interventions:  Yes, provided   Follow up plan: Follow up call scheduled for 11/23/21 1:30 pm    Encounter Outcome:  Pt. Visit Completed   Divya Munshi L. Lavina Hamman, RN, BSN, [place] Coordinator Office number [phone]
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
## 2021-11-30 ENCOUNTER — Inpatient Hospital Stay (HOSPITAL_COMMUNITY)
Admission: EM | Admit: 2021-11-30 | Discharge: 2021-12-06 | DRG: 330 | Disposition: A | Discharge status: Home Health | Payer: Medicare Other | Attending: Student | Admitting: Student

## 2021-11-30 ENCOUNTER — Other Ambulatory Visit: Payer: Self-pay

## 2021-11-30 ENCOUNTER — Emergency Department (HOSPITAL_COMMUNITY): Payer: Medicare Other

## 2021-11-30 ENCOUNTER — Encounter (HOSPITAL_COMMUNITY): Payer: Self-pay

## 2021-11-30 DIAGNOSIS — R636 Underweight: Secondary | ICD-10-CM | POA: Diagnosis not present

## 2021-11-30 DIAGNOSIS — Z87891 Personal history of nicotine dependence: Secondary | ICD-10-CM

## 2021-11-30 DIAGNOSIS — E872 Acidosis, unspecified: Secondary | ICD-10-CM | POA: Diagnosis present

## 2021-11-30 DIAGNOSIS — K4030 Unilateral inguinal hernia, with obstruction, without gangrene, not specified as recurrent: Secondary | ICD-10-CM

## 2021-11-30 DIAGNOSIS — K56609 Unspecified intestinal obstruction, unspecified as to partial versus complete obstruction: Secondary | ICD-10-CM

## 2021-11-30 DIAGNOSIS — Z23 Encounter for immunization: Secondary | ICD-10-CM

## 2021-11-30 DIAGNOSIS — R279 Unspecified lack of coordination: Secondary | ICD-10-CM | POA: Diagnosis not present

## 2021-11-30 DIAGNOSIS — K9189 Other postprocedural complications and disorders of digestive system: Secondary | ICD-10-CM | POA: Diagnosis not present

## 2021-11-30 DIAGNOSIS — E559 Vitamin D deficiency, unspecified: Secondary | ICD-10-CM | POA: Diagnosis present

## 2021-11-30 DIAGNOSIS — Z9049 Acquired absence of other specified parts of digestive tract: Secondary | ICD-10-CM | POA: Diagnosis not present

## 2021-11-30 DIAGNOSIS — M810 Age-related osteoporosis without current pathological fracture: Secondary | ICD-10-CM | POA: Diagnosis present

## 2021-11-30 DIAGNOSIS — Z79899 Other long term (current) drug therapy: Secondary | ICD-10-CM | POA: Diagnosis not present

## 2021-11-30 DIAGNOSIS — Z8249 Family history of ischemic heart disease and other diseases of the circulatory system: Secondary | ICD-10-CM

## 2021-11-30 DIAGNOSIS — K559 Vascular disorder of intestine, unspecified: Secondary | ICD-10-CM | POA: Diagnosis not present

## 2021-11-30 DIAGNOSIS — R918 Other nonspecific abnormal finding of lung field: Secondary | ICD-10-CM | POA: Diagnosis not present

## 2021-11-30 DIAGNOSIS — F419 Anxiety disorder, unspecified: Secondary | ICD-10-CM | POA: Diagnosis not present

## 2021-11-30 DIAGNOSIS — R6889 Other general symptoms and signs: Secondary | ICD-10-CM | POA: Diagnosis not present

## 2021-11-30 DIAGNOSIS — Z681 Body mass index (BMI) 19 or less, adult: Secondary | ICD-10-CM | POA: Diagnosis not present

## 2021-11-30 DIAGNOSIS — I4891 Unspecified atrial fibrillation: Secondary | ICD-10-CM | POA: Diagnosis present

## 2021-11-30 DIAGNOSIS — R627 Adult failure to thrive: Secondary | ICD-10-CM

## 2021-11-30 DIAGNOSIS — N179 Acute kidney failure, unspecified: Secondary | ICD-10-CM | POA: Diagnosis not present

## 2021-11-30 DIAGNOSIS — Z7982 Long term (current) use of aspirin: Secondary | ICD-10-CM

## 2021-11-30 DIAGNOSIS — Z743 Need for continuous supervision: Secondary | ICD-10-CM | POA: Diagnosis not present

## 2021-11-30 DIAGNOSIS — R1031 Right lower quadrant pain: Secondary | ICD-10-CM | POA: Diagnosis not present

## 2021-11-30 DIAGNOSIS — N289 Disorder of kidney and ureter, unspecified: Secondary | ICD-10-CM

## 2021-11-30 DIAGNOSIS — K9171 Accidental puncture and laceration of a digestive system organ or structure during a digestive system procedure: Secondary | ICD-10-CM | POA: Diagnosis not present

## 2021-11-30 DIAGNOSIS — K660 Peritoneal adhesions (postprocedural) (postinfection): Secondary | ICD-10-CM | POA: Diagnosis not present

## 2021-11-30 DIAGNOSIS — K55019 Acute (reversible) ischemia of small intestine, extent unspecified: Secondary | ICD-10-CM | POA: Diagnosis not present

## 2021-11-30 DIAGNOSIS — K4130 Unilateral femoral hernia, with obstruction, without gangrene, not specified as recurrent: Secondary | ICD-10-CM | POA: Diagnosis not present

## 2021-11-30 DIAGNOSIS — R52 Pain, unspecified: Secondary | ICD-10-CM | POA: Diagnosis not present

## 2021-11-30 DIAGNOSIS — R1110 Vomiting, unspecified: Secondary | ICD-10-CM

## 2021-11-30 DIAGNOSIS — K55049 Acute infarction of large intestine, extent unspecified: Secondary | ICD-10-CM | POA: Diagnosis not present

## 2021-11-30 DIAGNOSIS — I728 Aneurysm of other specified arteries: Secondary | ICD-10-CM | POA: Diagnosis not present

## 2021-11-30 DIAGNOSIS — E782 Mixed hyperlipidemia: Secondary | ICD-10-CM | POA: Diagnosis not present

## 2021-11-30 DIAGNOSIS — E538 Deficiency of other specified B group vitamins: Secondary | ICD-10-CM | POA: Diagnosis not present

## 2021-11-30 DIAGNOSIS — K5939 Other megacolon: Secondary | ICD-10-CM | POA: Diagnosis not present

## 2021-11-30 DIAGNOSIS — R5381 Other malaise: Secondary | ICD-10-CM | POA: Diagnosis not present

## 2021-11-30 DIAGNOSIS — K529 Noninfective gastroenteritis and colitis, unspecified: Secondary | ICD-10-CM | POA: Diagnosis not present

## 2021-11-30 DIAGNOSIS — D509 Iron deficiency anemia, unspecified: Secondary | ICD-10-CM | POA: Diagnosis present

## 2021-11-30 DIAGNOSIS — Z8719 Personal history of other diseases of the digestive system: Secondary | ICD-10-CM

## 2021-11-30 DIAGNOSIS — I10 Essential (primary) hypertension: Secondary | ICD-10-CM | POA: Diagnosis not present

## 2021-11-30 DIAGNOSIS — R112 Nausea with vomiting, unspecified: Secondary | ICD-10-CM | POA: Diagnosis not present

## 2021-11-30 DIAGNOSIS — N2889 Other specified disorders of kidney and ureter: Secondary | ICD-10-CM | POA: Diagnosis not present

## 2021-11-30 DIAGNOSIS — E875 Hyperkalemia: Secondary | ICD-10-CM | POA: Diagnosis not present

## 2021-11-30 DIAGNOSIS — Y658 Other specified misadventures during surgical and medical care: Secondary | ICD-10-CM | POA: Diagnosis not present

## 2021-11-30 DIAGNOSIS — R109 Unspecified abdominal pain: Secondary | ICD-10-CM

## 2021-11-30 DIAGNOSIS — K658 Other peritonitis: Secondary | ICD-10-CM | POA: Diagnosis not present

## 2021-11-30 DIAGNOSIS — Z4682 Encounter for fitting and adjustment of non-vascular catheter: Secondary | ICD-10-CM | POA: Diagnosis not present

## 2021-11-30 DIAGNOSIS — K460 Unspecified abdominal hernia with obstruction, without gangrene: Principal | ICD-10-CM

## 2021-11-30 LAB — I-STAT CHEM 8, ED
BUN: 31 mg/dL — ABNORMAL HIGH (ref 8–23)
Calcium, Ion: 1.25 mmol/L (ref 1.15–1.40)
Chloride: 108 mmol/L (ref 98–111)
Creatinine, Ser: 1.7 mg/dL — ABNORMAL HIGH (ref 0.44–1.00)
Glucose, Bld: 122 mg/dL — ABNORMAL HIGH (ref 70–99)
HCT: 38 % (ref 36.0–46.0)
Hemoglobin: 12.9 g/dL (ref 12.0–15.0)
Potassium: 5.4 mmol/L — ABNORMAL HIGH (ref 3.5–5.1)
Sodium: 135 mmol/L (ref 135–145)
TCO2: 23 mmol/L (ref 22–32)

## 2021-11-30 LAB — COMPREHENSIVE METABOLIC PANEL
ALT: 11 U/L (ref 0–44)
AST: 21 U/L (ref 15–41)
Albumin: 4.5 g/dL (ref 3.5–5.0)
Alkaline Phosphatase: 57 U/L (ref 38–126)
Anion gap: 11 (ref 5–15)
BUN: 34 mg/dL — ABNORMAL HIGH (ref 8–23)
CO2: 23 mmol/L (ref 22–32)
Calcium: 10.4 mg/dL — ABNORMAL HIGH (ref 8.9–10.3)
Chloride: 105 mmol/L (ref 98–111)
Creatinine, Ser: 1.67 mg/dL — ABNORMAL HIGH (ref 0.44–1.00)
GFR, Estimated: 31 mL/min — ABNORMAL LOW (ref >60)
Glucose, Bld: 124 mg/dL — ABNORMAL HIGH (ref 70–99)
Potassium: 5.5 mmol/L — ABNORMAL HIGH (ref 3.5–5.1)
Sodium: 139 mmol/L (ref 135–145)
Total Bilirubin: 1.2 mg/dL (ref 0.3–1.2)
Total Protein: 7.6 g/dL (ref 6.5–8.1)

## 2021-11-30 LAB — CBC WITH DIFFERENTIAL/PLATELET
Abs Immature Granulocytes: 0.06 10*3/uL (ref 0.00–0.07)
Basophils Absolute: 0 10*3/uL (ref 0.0–0.1)
Basophils Relative: 0 %
Eosinophils Absolute: 0 10*3/uL (ref 0.0–0.5)
Eosinophils Relative: 0 %
HCT: 39.5 % (ref 36.0–46.0)
Hemoglobin: 12.8 g/dL (ref 12.0–15.0)
Immature Granulocytes: 1 %
Lymphocytes Relative: 9 %
Lymphs Abs: 0.8 10*3/uL (ref 0.7–4.0)
MCH: 29.6 pg (ref 26.0–34.0)
MCHC: 32.4 g/dL (ref 30.0–36.0)
MCV: 91.4 fL (ref 80.0–100.0)
Monocytes Absolute: 0.5 10*3/uL (ref 0.1–1.0)
Monocytes Relative: 6 %
Neutro Abs: 7.7 10*3/uL (ref 1.7–7.7)
Neutrophils Relative %: 84 %
Platelets: 308 10*3/uL (ref 150–400)
RBC: 4.32 MIL/uL (ref 3.87–5.11)
RDW: 13.4 % (ref 11.5–15.5)
WBC: 9.1 10*3/uL (ref 4.0–10.5)
nRBC: 0 % (ref 0.0–0.2)

## 2021-11-30 LAB — LACTIC ACID, PLASMA
Lactic Acid, Venous: 1.6 mmol/L (ref 0.5–1.9)
Lactic Acid, Venous: 1.4 mmol/L (ref 0.5–1.9)

## 2021-11-30 MED ORDER — ACETAMINOPHEN 325 MG PO TABS
650.0000 mg | ORAL_TABLET | Freq: Four times a day (QID) | ORAL | Status: DC | PRN
  Administered 2021-12-03: 650 mg via ORAL
  Filled 2021-11-30 (×2): qty 2

## 2021-11-30 MED ORDER — MORPHINE SULFATE (PF) 4 MG/ML IV SOLN
4.0000 mg | Freq: Once | INTRAVENOUS | Status: AC
  Administered 2021-11-30: 4 mg via INTRAVENOUS
  Filled 2021-11-30: qty 1

## 2021-11-30 MED ORDER — MORPHINE SULFATE (PF) 2 MG/ML IV SOLN
2.0000 mg | INTRAVENOUS | Status: DC | PRN
  Administered 2021-12-01 – 2021-12-03 (×5): 2 mg via INTRAVENOUS
  Filled 2021-11-30 (×6): qty 1

## 2021-11-30 MED ORDER — SODIUM CHLORIDE 0.9 % IV SOLN: INTRAVENOUS | Status: DC

## 2021-11-30 MED ORDER — CALCIUM GLUCONATE-NACL 1-0.675 GM/50ML-% IV SOLN: 1.0000 g | Freq: Once | INTRAVENOUS | Status: DC

## 2021-11-30 MED ORDER — SODIUM CHLORIDE 0.9 % IV BOLUS
500.0000 mL | Freq: Once | INTRAVENOUS | Status: AC
  Administered 2021-11-30: 500 mL via INTRAVENOUS

## 2021-11-30 MED ORDER — ACETAMINOPHEN 650 MG RE SUPP: 650.0000 mg | Freq: Four times a day (QID) | RECTAL | Status: DC | PRN

## 2021-11-30 MED ORDER — ONDANSETRON HCL 4 MG/2ML IJ SOLN
4.0000 mg | Freq: Four times a day (QID) | INTRAMUSCULAR | Status: DC | PRN
  Administered 2021-12-01 (×2): 4 mg via INTRAVENOUS
  Filled 2021-11-30 (×2): qty 2

## 2021-11-30 MED ORDER — IOHEXOL 300 MG/ML  SOLN
100.0000 mL | Freq: Once | INTRAMUSCULAR | Status: AC | PRN
  Administered 2021-11-30: 75 mL via INTRAVENOUS

## 2021-11-30 MED ORDER — INFLUENZA VAC A&B SA ADJ QUAD 0.5 ML IM PRSY
0.5000 mL | PREFILLED_SYRINGE | INTRAMUSCULAR | Status: AC
  Administered 2021-12-02: 0.5 mL via INTRAMUSCULAR
  Filled 2021-11-30: qty 0.5

## 2021-11-30 MED ORDER — HYDRALAZINE HCL 20 MG/ML IJ SOLN: 10.0000 mg | Freq: Four times a day (QID) | INTRAMUSCULAR | Status: DC | PRN

## 2021-11-30 MED ORDER — ONDANSETRON HCL 4 MG/2ML IJ SOLN
4.0000 mg | Freq: Once | INTRAMUSCULAR | Status: AC
  Administered 2021-11-30: 4 mg via INTRAVENOUS
  Filled 2021-11-30: qty 2

## 2021-11-30 MED ORDER — ONDANSETRON HCL 4 MG PO TABS: 4.0000 mg | ORAL_TABLET | Freq: Four times a day (QID) | ORAL | Status: DC | PRN

## 2021-11-30 NOTE — H&P (Signed)
History and Physical    Patient: Robin [name] DXA:[id number] DOB: 1940/09/12 DOA: 11/30/2021 DOS: the patient was seen and examined on 11/30/2021 PCP: Chevis Pretty, FNP  Patient coming from: Home  Chief Complaint:  Chief Complaint  Patient presents with   Abdominal Pain   HPI: Robin [name] is a 81 y.o. female with medical history significant of hypertension, osteoporosis, vitamin D deficiency, hyperlipidemia who presents to the emergency department due to worsening pain at the right inguinal hernia.  Patient states that she picked up a box yesterday in the evening and sensed pain at the right inguinal area.  She continued to have pain in the same area today and this is associated with vomiting at the hernia site which appears larger and more firm than baseline, so she decided to go to the ED for further evaluation and management.  ED Course:  In the emergency department, she was hemodynamically stable, BP was 130/100 and other vital signs were within normal range.  Work-up in the ED showed normal CBC, BMP showed sodium 139, potassium 4.5, chloride 105, bicarb 23, glucose 124, BUN 34, creatinine 1.67 (baseline creatinine at 0.8-1.1), calcium 10.4  .  Lactic acid x2 was normal. CT abdomen and pelvis with contrast showed: 1. Right inguinal hernia with narrow neck contains a short segment of fluid-filled small bowel. There is no definite associated fat stranding or inflammation, although the small bowel proximal to this is prominent and mildly dilated, suspicious for early or partial small bowel obstruction. 2. Small to moderate volume of free fluid in the dependent pelvis is likely reactive. 3. Indeterminate 11 mm exophytic lesion arising from the upper left kidney. Recommend renal protocol MRI for characterization on a nonemergent basis.  She was treated with morphine due to pain, Zofran was given due to vomiting and IV hydration was provided.  General surgery was consulted and  will take patient to the OR tomorrow for right inguinal hernia repair.   Review of Systems: Review of systems as noted in the HPI. All other systems reviewed and are negative.   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 Sharlett Iles: 5cm circumferential fungating tumor at the hepatic flexure, path tubulovillous adenoma.    COLONOSCOPY  01/02/2006   Dr. 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 Multiple polyps around anastomosis of different sizes,    COLONOSCOPY  02/21/2006   Dr. Sharlett Iles. Referred to Dr. Georgette Dover for resection   COLONOSCOPY N/A 07/18/2014   Procedure: COLONOSCOPY;  Surgeon: Daneil Dolin, MD;  Location: [facility];  Service: Endoscopy;  Laterality: N/A;  1230   COLONOSCOPY WITH PROPOFOL N/A 04/22/2020   Procedure: COLONOSCOPY WITH PROPOFOL;  Surgeon: Rogene Houston, MD;  Location: [facility];  Service: Endoscopy;  Laterality: N/A;  am   EYE SURGERY     LAPAROSCOPIC LYSIS OF ADHESIONS     PARTIAL COLECTOMY  02/21/2005   due to tubulovillous adenoma   TUBAL LIGATION  Social History:  reports that she quit smoking about 62 years ago. Her smoking use included cigarettes. She has never used smokeless tobacco. She reports that she does not drink alcohol and does not use drugs.   No Known Allergies  Family History  Problem Relation Age of Onset   Diabetes Mother    Heart disease Mother    Cancer Mother        patient unsure of type   Hip fracture Mother    Heart  disease Father        antigioplasty   Cancer Sister        lung   COPD Sister    Heart disease Sister    Atrial fibrillation Sister    Heart disease Sister        stent in left leg   Varicose Veins Sister    Deep vein thrombosis Sister    Heart disease Brother    COPD Brother    COPD Brother    Huntington's disease Daughter    Carpal tunnel syndrome Daughter    Brain cancer Son    Colon cancer Neg Hx      Prior to Admission medications   Medication Sig Start Date End Date Taking? Authorizing Provider  aspirin EC 81 MG tablet Take 81 mg by mouth daily.    [provider]  atorvastatin (LIPITOR) 80 MG tablet Take 1 tablet (80 mg total) by mouth daily. 06/18/21   Chevis Pretty, FNP  Cholecalciferol (VITAMIN D) 2000 UNITS tablet Take 4000IU on saturdays and sundays and 2000IU all other days. Patient taking differently: Take 2,000 Units by mouth See admin instructions. Take 1 tablet (2000 units) on Mondays through Fridays and take 2 tablets (4000 units) by mouth on Saturdays & Sundays. 11/28/13   Eckard, Tammy, RPH-CPP  denosumab (PROLIA) 60 MG/ML SOSY injection BRING TO OFFICE FOR ADMINISTRATION. ADMINISTER IN UPPER ARM, THIGH OR ABDOMEN 09/14/21   Chevis Pretty, FNP  ENSURE PLUS (ENSURE PLUS) LIQD Take 1 Can by mouth daily. Dx:  R63.6 (underweight) 12/19/17   Hassell Done, Mary-Margaret, FNP  escitalopram (LEXAPRO) 10 MG tablet Take 1 tablet (10 mg total) by mouth daily. 06/18/21   Hassell Done, Mary-Margaret, FNP  lisinopril (ZESTRIL) 10 MG tablet Take 1 tablet (10 mg total) by mouth daily. 06/18/21   Chevis Pretty, FNP    Physical Exam: BP 135/72 (BP Location: Right Arm)   Pulse 74   Temp 98.9 F (37.2 C)   Resp 20   Ht '5\' 3"'$  (1.6 m)   Wt 42.5 kg   SpO2 100%   BMI 16.60 kg/m   General: 81 y.o. year-old female well developed well nourished in no acute distress.  Alert and oriented x3. HEENT: NCAT, EOMI Neck: Supple, trachea medial Cardiovascular: Regular  rate and rhythm with no rubs or gallops.  No thyromegaly or JVD noted.  No lower extremity edema. 2/4 pulses in all 4 extremities. Respiratory: Clear to auscultation with no wheezes or rales. Good inspiratory effort. Abdomen: Soft, right inguinal hernia tender to palpation.  Normal bowel sounds x4 quadrants. Muskuloskeletal: No cyanosis, clubbing or edema noted bilaterally Neuro: CN II-XII intact, strength 5/5 x 4, sensation, reflexes intact Skin: No ulcerative lesions noted or rashes Psychiatry: Judgement and insight appear normal. Mood is appropriate for condition and setting          Labs on Admission:  Basic Metabolic Panel: Recent Labs  Lab 11/30/21 1620 11/30/21 1633  NA 139 135  K 5.5* 5.4*  CL  105 108  CO2 23  --   GLUCOSE 124* 122*  BUN 34* 31*  CREATININE 1.67* 1.70*  CALCIUM 10.4*  --    Liver Function Tests: Recent Labs  Lab 11/30/21 1620  AST 21  ALT 11  ALKPHOS 57  BILITOT 1.2  PROT 7.6  ALBUMIN 4.5   No results for input(s): "LIPASE", "AMYLASE" in the last 168 hours. No results for input(s): "AMMONIA" in the last 168 hours. CBC: Recent Labs  Lab 11/30/21 1620 11/30/21 1633  WBC 9.1  --   NEUTROABS 7.7  --   HGB 12.8 12.9  HCT 39.5 38.0  MCV 91.4  --   PLT 308  --    Cardiac Enzymes: No results for input(s): "CKTOTAL", "CKMB", "CKMBINDEX", "TROPONINI" in the last 168 hours.  BNP (last 3 results) No results for input(s): "BNP" in the last 8760 hours.  ProBNP (last 3 results) No results for input(s): "PROBNP" in the last 8760 hours.  CBG: No results for input(s): "GLUCAP" in the last 168 hours.  Radiological Exams on Admission: CT ABDOMEN PELVIS W CONTRAST  Result Date: 11/30/2021 CLINICAL DATA:  Hernia, complicated possible incarcerated L inguinal hernia EXAM: CT ABDOMEN AND PELVIS WITH CONTRAST TECHNIQUE: Multidetector CT imaging of the abdomen and pelvis was performed using the standard protocol following bolus administration of  intravenous contrast. RADIATION DOSE REDUCTION: This exam was performed according to the departmental dose-optimization program which includes automated exposure control, adjustment of the mA and/or kV according to patient size and/or use of iterative reconstruction technique. CONTRAST:  9m OMNIPAQUE IOHEXOL 300 MG/ML  SOLN COMPARISON:  None Available. FINDINGS: Lower chest: Clear lung bases. Hepatobiliary: 18 and 24 mm cysts in the left hepatic lobe. Additional scattered subcentimeter hypodensities are too small to accurately characterize but also likely cysts. No specific imaging follow-up is recommended. No suspicious liver lesion. No calcified gallstone or pericholecystic inflammation. Probable gallbladder Phrygian cap. No biliary dilatation. Pancreas: No ductal dilatation or inflammation. Spleen: Normal in size without focal abnormality. Small splenule inferiorly. 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 Given patient age and size, no specific imaging follow-up is needed. Adrenals/Urinary Tract: No adrenal nodule. No hydronephrosis. There is thinning of the bilateral renal parenchyma, more so on the left. Multiple low-density lesions within both kidneys, majority are too small to characterize but likely cysts. No imaging follow-up of these lesions is needed. However there is an exophytic 11 mm lesion arising from the upper left kidney with Hounsfield units of 83, indeterminate. No visible renal stones. Partially distended urinary bladder, normal for degree of distension. Stomach/Bowel: Right inguinal hernia with narrow neck contains a short segment of fluid-filled small bowel. There is no definite associated fat stranding or inflammation, although the small bowel proximal to this is prominent and mildly dilated. No bowel pneumatosis. The exiting small bowel is decompressed. Small hiatal hernia with otherwise decompressed stomach. The colon is near completely decompressed. There are enteric chain  sutures in the ascending colon. The appendix is not seen. Vascular/Lymphatic: Moderate aortic atherosclerosis. No aneurysm. Patent portal and splenic veins. Patent mesenteric veins. No mesenteric venous gas. Reproductive: Uterus and bilateral adnexa are unremarkable. Other: Small to moderate volume of free fluid in the dependent pelvis is likely reactive. No free air or focal fluid collection. Musculoskeletal: The bones are diffusely under mineralized. Scoliosis and degenerative change in the spine. There are no acute or suspicious osseous abnormalities. IMPRESSION: 1. Right inguinal hernia with narrow neck contains a short segment of fluid-filled  small bowel. There is no definite associated fat stranding or inflammation, although the small bowel proximal to this is prominent and mildly dilated, suspicious for early or partial small bowel obstruction. 2. Small to moderate volume of free fluid in the dependent pelvis is likely reactive. 3. Indeterminate 11 mm exophytic lesion arising from the upper left kidney. Recommend renal protocol MRI for characterization on a nonemergent basis. Aortic Atherosclerosis (ICD10-I70.0). Electronically Signed   By: Keith Rake M.D.   On: 11/30/2021 18:57    EKG: I independently viewed the EKG done and my findings are as followed: Normal sinus rhythm at a rate of 75 bpm with fourth degree AV block  Assessment/Plan Present on Admission:  Incarcerated right inguinal hernia  Essential hypertension  Osteoporosis  Underweight  Vitamin D insufficiency  Principal Problem:   Incarcerated right inguinal hernia Active Problems:   Osteoporosis   Mixed hyperlipidemia   Vitamin D insufficiency   Underweight   Essential hypertension   Abdominal pain   SBO (small bowel obstruction) (HCC)   Hyperkalemia   Hypercalcemia   Renal lesion   Failure to thrive in adult   Vomiting   Abdominal pain and vomiting due to incarcerated right inguinal hernia CT abdomen pelvis  showed right inguinal hernia Continue IV morphine as needed Continue n.p.o. in management for surgical hernia repair in the morning General surgery already consulted and will follow-up with patient in the morning  Small bowel obstruction secondary to above Continue NG tube  Continue NPO at this time  Continue IV hydration Continue IV morphine 2 mg q.4h p.r.n. for moderate/severe pain Continue Zofran p.r.n. for nausea/vomiting Neurosurgery already consulted as indicated above  Hyperkalemia K+ 5.5, continue IV hydration No acute EKG changes  Hypocalcemia Calcium 10.4, continue IV hydration  Acute kidney injury Creatinine 1.67 (baseline creatinine at 0.8-1.1) Continue IV hydration Renally adjust medications, avoid nephrotoxic agents/dehydration/hypotension  Left renal lesion Indeterminate 11 mm exophytic lesion arising from the upper left kidney was noted on CT abdomen and pelvis Renal MRI on a nonemergent basis was recommended  Essential hypertension (uncontrolled) Continue IV hydralazine 10 mg every 6 hours as needed for SBP > 170 Continue oral meds when patient resumes oral intake  Mixed hyperlipidemia Osteoporosis Vitamin D deficiency Continue oral meds when patient resumes oral intake  Failure to thrive in an adult Underweight (BMI 16.60) Consider providing protein supplement when patient resumes oral intake Consider dietitian consult  DVT prophylaxis: SCDs  Code Status: Full code  Consults: General surgery  Family Communication: None at bedside  Severity of Illness: The appropriate patient status for this patient is INPATIENT. Inpatient status is judged to be reasonable and necessary in order to provide the required intensity of service to ensure the patient's safety. The patient's presenting symptoms, physical exam findings, and initial radiographic and laboratory data in the context of their chronic comorbidities is felt to place them at high risk for further  clinical deterioration. Furthermore, it is not anticipated that the patient will be medically stable for discharge from the hospital within 2 midnights of admission.   * I certify that at the point of admission it is my clinical judgment that the patient will require inpatient hospital care spanning beyond 2 midnights from the point of admission due to high intensity of service, high risk for further deterioration and high frequency of surveillance required.*  Author: Bernadette Hoit, DO 11/30/2021 10:16 PM  For on call review [email or web address].

## 2021-11-30 NOTE — ED Provider Triage Note (Signed)
Emergency Medicine Provider Triage Evaluation Note  TYNESIA HARRAL , a 81 y.o. female with past medical history of hypertension hyperlipidemia and atrial fibrillation was evaluated in triage.  Pt complains of right lower abdominal pain and vomiting.  Has history of inguinal hernia for several years.  She bent over to pick up a box last evening when she felt a pain to the area of for hernia.  Shortly after onset of pain she began vomiting and reports multiple episodes.  States the hernia feels as though it has increased in size and now feels "hard."  Pain also radiates to her mid upper abdomen.  She denies dysuria symptoms, fever, diarrhea or constipation   Review of Systems  Positive: Abdominal pain, vomiting, hernia pain Negative: Fever, diarrhea, constipation  Physical Exam  BP (!) 130/100 (BP Location: Right Arm)   Pulse 95   Temp 98 F (36.7 C) (Oral)   Resp 15   Ht '5\' 3"'$  (1.6 m)   Wt 44.5 kg   SpO2 97%   BMI 17.36 kg/m  Gen:   Awake, no distress   Resp:  Normal effort  MSK:   Moves extremities without difficulty  Other:  Abdominal pain. firm, palpable right inguinal hernia  Medical Decision Making  Medically screening exam initiated at 3:40 PM.  Appropriate orders placed.  [name] was informed that the remainder of the evaluation will be completed by another provider, this initial triage assessment does not replace that evaluation, and the importance of remaining in the ED until their evaluation is complete.     Kem Parkinson, PA-C 11/30/21 1611

## 2021-11-30 NOTE — ED Provider Notes (Signed)
[facility] EMERGENCY DEPARTMENT Provider Note   CSN: [id number] Arrival date & time: 11/30/21  1502     History  Chief Complaint  Patient presents with   Abdominal Pain    Robin [name] is a 81 y.o. female.  Patient with history of colon cancer status post hemicolectomy 2007, atrial fibrillation -- presents emergency department for evaluation of right inguinal pain.  Patient states that she has had a hernia in this area.  Typically she will have a small lump that is soft.  However last night she developed a larger lump in this area that is tender and firm.  She reports having some nausea and is spitting up some clear liquid, but no recurrent vomiting.  No diarrhea.  No fevers but states that she feels generally cold.  She states that this area is never gotten stuck before like this.  No urinary symptoms.  I confirm with patient that she is not on anticoagulation for paroxysmal atrial fibrillation.  She last ate Sunday (10/8) evening.       Home Medications Prior to Admission medications   Medication Sig Start Date End Date Taking? Authorizing Provider  aspirin EC 81 MG tablet Take 81 mg by mouth daily.    [provider]  atorvastatin (LIPITOR) 80 MG tablet Take 1 tablet (80 mg total) by mouth daily. 06/18/21   Chevis Pretty, FNP  Cholecalciferol (VITAMIN D) 2000 UNITS tablet Take 4000IU on saturdays and sundays and 2000IU all other days. Patient taking differently: Take 2,000 Units by mouth See admin instructions. Take 1 tablet (2000 units) on Mondays through Fridays and take 2 tablets (4000 units) by mouth on Saturdays & Sundays. 11/28/13   Eckard, Tammy, RPH-CPP  denosumab (PROLIA) 60 MG/ML SOSY injection BRING TO OFFICE FOR ADMINISTRATION. ADMINISTER IN UPPER ARM, THIGH OR ABDOMEN 09/14/21   Chevis Pretty, FNP  ENSURE PLUS (ENSURE PLUS) LIQD Take 1 Can by mouth daily. Dx:  R63.6 (underweight) 12/19/17   Hassell Done, Mary-Margaret, FNP  escitalopram (LEXAPRO)  10 MG tablet Take 1 tablet (10 mg total) by mouth daily. 06/18/21   Hassell Done, Mary-Margaret, FNP  lisinopril (ZESTRIL) 10 MG tablet Take 1 tablet (10 mg total) by mouth daily. 06/18/21   Chevis Pretty, FNP      Allergies    Patient has no known allergies.    Review of Systems   Review of Systems  Physical Exam Updated Vital Signs BP (!) 130/100 (BP Location: Right Arm)   Pulse 95   Temp 98 F (36.7 C) (Oral)   Resp 15   Ht '5\' 3"'$  (1.6 m)   Wt 44.5 kg   SpO2 97%   BMI 17.36 kg/m  Physical Exam Vitals and nursing note reviewed.  Constitutional:      General: She is not in acute distress.    Appearance: She is well-developed.  HENT:     Head: Normocephalic and atraumatic.     Right Ear: External ear normal.     Left Ear: External ear normal.     Nose: Nose normal.  Eyes:     Conjunctiva/sclera: Conjunctivae normal.  Cardiovascular:     Rate and Rhythm: Normal rate and regular rhythm.     Heart sounds: No murmur heard. Pulmonary:     Effort: No respiratory distress.     Breath sounds: No wheezing, rhonchi or rales.  Abdominal:     Palpations: Abdomen is soft.     Tenderness: There is no abdominal tenderness. There is no guarding or  rebound.     Hernia: A hernia is present. Hernia is present in the right inguinal area.     Comments: Two firm adjacent firm nodules noted R lower abdomen that are tender to palpation. No overlying erythema.   Musculoskeletal:     Cervical back: Normal range of motion and neck supple.     Right lower leg: No edema.     Left lower leg: No edema.  Skin:    General: Skin is warm and dry.     Findings: No rash.  Neurological:     General: No focal deficit present.     Mental Status: She is alert. Mental status is at baseline.     Motor: No weakness.  Psychiatric:        Mood and Affect: Mood normal.     ED Results / Procedures / Treatments   Labs (all labs ordered are listed, but only abnormal results are displayed) Labs Reviewed  - No data to display  EKG None  Radiology CT ABDOMEN PELVIS W CONTRAST  Result Date: 11/30/2021 CLINICAL DATA:  Hernia, complicated possible incarcerated L inguinal hernia EXAM: CT ABDOMEN AND PELVIS WITH CONTRAST TECHNIQUE: Multidetector CT imaging of the abdomen and pelvis was performed using the standard protocol following bolus administration of intravenous contrast. RADIATION DOSE REDUCTION: This exam was performed according to the departmental dose-optimization program which includes automated exposure control, adjustment of the mA and/or kV according to patient size and/or use of iterative reconstruction technique. CONTRAST:  50m OMNIPAQUE IOHEXOL 300 MG/ML  SOLN COMPARISON:  None Available. FINDINGS: Lower chest: Clear lung bases. Hepatobiliary: 18 and 24 mm cysts in the left hepatic lobe. Additional scattered subcentimeter hypodensities are too small to accurately characterize but also likely cysts. No specific imaging follow-up is recommended. No suspicious liver lesion. No calcified gallstone or pericholecystic inflammation. Probable gallbladder Phrygian cap. No biliary dilatation. Pancreas: No ductal dilatation or inflammation. Spleen: Normal in size without focal abnormality. Small splenule inferiorly. 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 Given patient age and size, no specific imaging follow-up is needed. Adrenals/Urinary Tract: No adrenal nodule. No hydronephrosis. There is thinning of the bilateral renal parenchyma, more so on the left. Multiple low-density lesions within both kidneys, majority are too small to characterize but likely cysts. No imaging follow-up of these lesions is needed. However there is an exophytic 11 mm lesion arising from the upper left kidney with Hounsfield units of 83, indeterminate. No visible renal stones. Partially distended urinary bladder, normal for degree of distension. Stomach/Bowel: Right inguinal hernia with narrow neck contains a short  segment of fluid-filled small bowel. There is no definite associated fat stranding or inflammation, although the small bowel proximal to this is prominent and mildly dilated. No bowel pneumatosis. The exiting small bowel is decompressed. Small hiatal hernia with otherwise decompressed stomach. The colon is near completely decompressed. There are enteric chain sutures in the ascending colon. The appendix is not seen. Vascular/Lymphatic: Moderate aortic atherosclerosis. No aneurysm. Patent portal and splenic veins. Patent mesenteric veins. No mesenteric venous gas. Reproductive: Uterus and bilateral adnexa are unremarkable. Other: Small to moderate volume of free fluid in the dependent pelvis is likely reactive. No free air or focal fluid collection. Musculoskeletal: The bones are diffusely under mineralized. Scoliosis and degenerative change in the spine. There are no acute or suspicious osseous abnormalities. IMPRESSION: 1. Right inguinal hernia with narrow neck contains a short segment of fluid-filled small bowel. There is no definite associated fat stranding  or inflammation, although the small bowel proximal to this is prominent and mildly dilated, suspicious for early or partial small bowel obstruction. 2. Small to moderate volume of free fluid in the dependent pelvis is likely reactive. 3. Indeterminate 11 mm exophytic lesion arising from the upper left kidney. Recommend renal protocol MRI for characterization on a nonemergent basis. Aortic Atherosclerosis (ICD10-I70.0). Electronically Signed   By: Keith Rake M.D.   On: 11/30/2021 18:57    Procedures Procedures    Medications Ordered in ED Medications - No data to display  ED Course/ Medical Decision Making/ A&P    4:03 PM Patient seen and examined. History obtained directly from patient. Will get labs, treat pain, apply ice, continue attempt to reduce.   Labs/EKG: Ordered CBC, CMP, lactate.  Imaging: Ordered CT abdomen  pelvis.  Medications/Fluids: Ordered: Morphine, Zofran.   Most recent vital signs reviewed and are as follows: BP (!) 130/100 (BP Location: Right Arm)   Pulse 95   Temp 98 F (36.7 C) (Oral)   Resp 15   Ht '5\' 3"'$  (1.6 m)   Wt 44.5 kg   SpO2 97%   BMI 17.36 kg/m   Initial impression: Potentially incarcerated left inguinal hernia  Patient discussed with Dr. Kathrynn Humble, who will see.   5:06 PM    Labs personally reviewed and interpreted including: CBC unremarkable; CMP hyperkalemia potassium 5.5, glucose 1.24, creatinine 1.67 with BUN of 34, normal liver function testing.  Fluid bolus, EKG ordered. Awaiting imaging.   ED ECG REPORT   Date: 11/30/2021  Rate: 75  Rhythm: normal sinus rhythm, PACs  QRS Axis: normal  Intervals: normal  ST/T Wave abnormalities: normal  Conduction Disutrbances:none  Narrative Interpretation:   Old EKG Reviewed: None available  I have personally reviewed the EKG tracing and agree with the computerized printout as noted.  7:00 PM Reassessment performed. Patient appears stable.  Additional attempts at manual reduction unsuccessful.  Labs personally reviewed and interpreted including: Lactate was normal.  Imaging personally visualized and interpreted including: CT imaging of the abdomen pelvis, agree hernia right lower quadrant.  Reviewed pertinent lab work and imaging with patient at bedside. Questions answered.   Most current vital signs reviewed and are as follows: BP (!) 142/80 (BP Location: Right Arm)   Pulse 66   Temp 99.3 F (37.4 C) (Oral)   Resp 15   Ht '5\' 3"'$  (1.6 m)   Wt 44.5 kg   SpO2 96%   BMI 17.36 kg/m   Plan: Surgical eval likely needed. Signout to [employer] at shift change.                                Medical Decision Making Amount and/or Complexity of Data Reviewed Labs: ordered. Radiology: ordered.  Risk Prescription drug management.   For this patient's complaint of abdominal pain, the following  conditions were considered on the differential diagnosis: gastritis/PUD, enteritis/duodenitis, appendicitis, cholelithiasis/cholecystitis, cholangitis, pancreatitis, ruptured viscus, colitis, diverticulitis, small/large bowel obstruction, proctitis, cystitis, pyelonephritis, ureteral colic, aortic dissection, aortic aneurysm. In women, ectopic pregnancy, pelvic inflammatory disease, ovarian cysts, and tubo-ovarian abscess were also considered. Atypical chest etiologies were also considered including ACS, PE, and pneumonia.   She clinically has incarcerated right-sided inguinal hernia with early small bowel obstruction.  She does not appear septic.  White blood cell count is normal and she does not have lactic acidosis.  The patient's vital signs, pertinent lab work and imaging  were reviewed and interpreted as discussed in the ED course. Hospitalization was considered for further testing, treatments, or serial exams/observation. However as patient is well-appearing, has a stable exam, and reassuring studies today, I do not feel that they warrant admission at this time. This plan was discussed with the patient who verbalizes agreement and comfort with this plan and seems reliable and able to return to the Emergency Department with worsening or changing symptoms.          Final Clinical Impression(s) / ED Diagnoses Final diagnoses:  Incarcerated hernia    Rx / DC Orders ED Discharge Orders     None         Carlisle Cater, PA-C 11/30/21 1906    Varney Biles, MD 12/01/21 1643

## 2021-11-30 NOTE — ED Provider Notes (Signed)
Patient signed out to me by Alecia Lemming, PA-C pending CT results  Patient here with right lower quadrant pain and history of inguinal hernia secondary to prior colon resection.  Began having pain to the area of her hernia last evening after picking up a box.  Pain has been associated with vomiting.  She states the hernia now feels firm and larger than baseline.   On my exam palpable right inguinal hernia that is firm and nonreducible.  Abdomen is soft  See prior provider note for complete H&P.  Work-up without evidence of leukocytosis, mild hyperkalemia and some renal insufficiency, creatinine 1.6.  Lactic acid unremarkable.  CT abdomen and pelvis showing short segment of fluid-filled small bowel without definite fat stranding, suspicious for early or partial small bowel obstruction.  Patient n.p.o., IV morphine given for pain.  Will consult general surgery  Discussed findings with general surgery, Dr. Constance Haw.  She recommends hospitalist NG tube, n.p.o. after midnight with plan for surgery tomorrow morning  Consult [facility] hospitalist, Dr. Josephine Cables who agrees to admit  CT ABDOMEN PELVIS W CONTRAST  Result Date: 11/30/2021 CLINICAL DATA:  Hernia, complicated possible incarcerated L inguinal hernia EXAM: CT ABDOMEN AND PELVIS WITH CONTRAST TECHNIQUE: Multidetector CT imaging of the abdomen and pelvis was performed using the standard protocol following bolus administration of intravenous contrast. RADIATION DOSE REDUCTION: This exam was performed according to the departmental dose-optimization program which includes automated exposure control, adjustment of the mA and/or kV according to patient size and/or use of iterative reconstruction technique. CONTRAST:  62m OMNIPAQUE IOHEXOL 300 MG/ML  SOLN COMPARISON:  None Available. FINDINGS: Lower chest: Clear lung bases. Hepatobiliary: 18 and 24 mm cysts in the left hepatic lobe. Additional scattered subcentimeter hypodensities are too small to  accurately characterize but also likely cysts. No specific imaging follow-up is recommended. No suspicious liver lesion. No calcified gallstone or pericholecystic inflammation. Probable gallbladder Phrygian cap. No biliary dilatation. Pancreas: No ductal dilatation or inflammation. Spleen: Normal in size without focal abnormality. Small splenule inferiorly. 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 Given patient age and size, no specific imaging follow-up is needed. Adrenals/Urinary Tract: No adrenal nodule. No hydronephrosis. There is thinning of the bilateral renal parenchyma, more so on the left. Multiple low-density lesions within both kidneys, majority are too small to characterize but likely cysts. No imaging follow-up of these lesions is needed. However there is an exophytic 11 mm lesion arising from the upper left kidney with Hounsfield units of 83, indeterminate. No visible renal stones. Partially distended urinary bladder, normal for degree of distension. Stomach/Bowel: Right inguinal hernia with narrow neck contains a short segment of fluid-filled small bowel. There is no definite associated fat stranding or inflammation, although the small bowel proximal to this is prominent and mildly dilated. No bowel pneumatosis. The exiting small bowel is decompressed. Small hiatal hernia with otherwise decompressed stomach. The colon is near completely decompressed. There are enteric chain sutures in the ascending colon. The appendix is not seen. Vascular/Lymphatic: Moderate aortic atherosclerosis. No aneurysm. Patent portal and splenic veins. Patent mesenteric veins. No mesenteric venous gas. Reproductive: Uterus and bilateral adnexa are unremarkable. Other: Small to moderate volume of free fluid in the dependent pelvis is likely reactive. No free air or focal fluid collection. Musculoskeletal: The bones are diffusely under mineralized. Scoliosis and degenerative change in the spine. There are no acute  or suspicious osseous abnormalities. IMPRESSION: 1. Right inguinal hernia with narrow neck contains a short segment of fluid-filled small bowel.  There is no definite associated fat stranding or inflammation, although the small bowel proximal to this is prominent and mildly dilated, suspicious for early or partial small bowel obstruction. 2. Small to moderate volume of free fluid in the dependent pelvis is likely reactive. 3. Indeterminate 11 mm exophytic lesion arising from the upper left kidney. Recommend renal protocol MRI for characterization on a nonemergent basis. Aortic Atherosclerosis (ICD10-I70.0). Electronically Signed   By: Keith Rake M.D.   On: 11/30/2021 18:57      Kem Parkinson, PA-C 11/30/21 2013    Varney Biles, MD 12/01/21 1655

## 2021-11-30 NOTE — ED Notes (Addendum)
Pt's son called to get an update on pt, informed him pt is still waiting on test results but we could call if pt is to be admitted;  his name is Robin [name] and he can be reached at [phone]; if pt is to be discharged pt's friend Vladimir Creeks can be contacted at discharge

## 2021-11-30 NOTE — Progress Notes (Addendum)
[facility]  Plan to fix hernia in the AM. Right inguinal hernia incarcerated with concern for SBO.   Reviewed CT. Small bowel, no signs of strangulation or bowel edema, pneumatosis, plan for NG tonight, resuscitation with fluids to help get K down. OR tomorrow for R inguinal hernia repair.   Will discuss with patient in the AM. NPO at midnight.  IVF for resuscitation. OR likely around [place], MD [facility] [address] [place], [place] [address] [phone] (office)

## 2021-11-30 NOTE — ED Triage Notes (Signed)
Pt presents to ED with pain from abdominal hernia, states she was picking up a box last night and that's when it started hurting.

## 2021-12-01 ENCOUNTER — Encounter (HOSPITAL_COMMUNITY)
Admission: EM | Disposition: A | Discharge status: Home Health | Payer: Self-pay | Source: Home / Self Care | Attending: Student

## 2021-12-01 ENCOUNTER — Encounter (HOSPITAL_COMMUNITY): Payer: Self-pay | Admitting: Internal Medicine

## 2021-12-01 ENCOUNTER — Inpatient Hospital Stay (HOSPITAL_COMMUNITY): Payer: Medicare Other

## 2021-12-01 ENCOUNTER — Other Ambulatory Visit: Payer: Self-pay

## 2021-12-01 ENCOUNTER — Inpatient Hospital Stay (HOSPITAL_COMMUNITY): Payer: Medicare Other | Admitting: Anesthesiology

## 2021-12-01 DIAGNOSIS — K559 Vascular disorder of intestine, unspecified: Secondary | ICD-10-CM

## 2021-12-01 DIAGNOSIS — K56609 Unspecified intestinal obstruction, unspecified as to partial versus complete obstruction: Secondary | ICD-10-CM | POA: Diagnosis not present

## 2021-12-01 DIAGNOSIS — Z87891 Personal history of nicotine dependence: Secondary | ICD-10-CM

## 2021-12-01 DIAGNOSIS — R1031 Right lower quadrant pain: Secondary | ICD-10-CM | POA: Diagnosis not present

## 2021-12-01 DIAGNOSIS — I10 Essential (primary) hypertension: Secondary | ICD-10-CM

## 2021-12-01 DIAGNOSIS — R627 Adult failure to thrive: Secondary | ICD-10-CM | POA: Diagnosis not present

## 2021-12-01 DIAGNOSIS — K4130 Unilateral femoral hernia, with obstruction, without gangrene, not specified as recurrent: Secondary | ICD-10-CM

## 2021-12-01 DIAGNOSIS — K4030 Unilateral inguinal hernia, with obstruction, without gangrene, not specified as recurrent: Secondary | ICD-10-CM | POA: Diagnosis not present

## 2021-12-01 DIAGNOSIS — M810 Age-related osteoporosis without current pathological fracture: Secondary | ICD-10-CM

## 2021-12-01 DIAGNOSIS — F419 Anxiety disorder, unspecified: Secondary | ICD-10-CM

## 2021-12-01 HISTORY — PX: BOWEL RESECTION: SHX1257

## 2021-12-01 HISTORY — PX: FEMORAL HERNIA REPAIR: SHX632

## 2021-12-01 LAB — COMPREHENSIVE METABOLIC PANEL
ALT: 9 U/L (ref 0–44)
AST: 15 U/L (ref 15–41)
Albumin: 3.3 g/dL — ABNORMAL LOW (ref 3.5–5.0)
Alkaline Phosphatase: 40 U/L (ref 38–126)
Anion gap: 7 (ref 5–15)
BUN: 25 mg/dL — ABNORMAL HIGH (ref 8–23)
CO2: 20 mmol/L — ABNORMAL LOW (ref 22–32)
Calcium: 8.4 mg/dL — ABNORMAL LOW (ref 8.9–10.3)
Chloride: 112 mmol/L — ABNORMAL HIGH (ref 98–111)
Creatinine, Ser: 1.2 mg/dL — ABNORMAL HIGH (ref 0.44–1.00)
GFR, Estimated: 46 mL/min — ABNORMAL LOW (ref >60)
Glucose, Bld: 118 mg/dL — ABNORMAL HIGH (ref 70–99)
Potassium: 4.6 mmol/L (ref 3.5–5.1)
Sodium: 139 mmol/L (ref 135–145)
Total Bilirubin: 1.1 mg/dL (ref 0.3–1.2)
Total Protein: 5.6 g/dL — ABNORMAL LOW (ref 6.5–8.1)

## 2021-12-01 LAB — MAGNESIUM: Magnesium: 1.4 mg/dL — ABNORMAL LOW (ref 1.7–2.4)

## 2021-12-01 LAB — PHOSPHORUS: Phosphorus: 3 mg/dL (ref 2.5–4.6)

## 2021-12-01 SURGERY — EXCISION, SMALL INTESTINE
Anesthesia: General | Site: Pelvis | Laterality: Right

## 2021-12-01 MED ORDER — FENTANYL CITRATE (PF) 100 MCG/2ML IJ SOLN
INTRAMUSCULAR | Status: AC
  Filled 2021-12-01: qty 2

## 2021-12-01 MED ORDER — MAGNESIUM SULFATE 2 GM/50ML IV SOLN
2.0000 g | Freq: Once | INTRAVENOUS | Status: AC
  Administered 2021-12-01: 2 g via INTRAVENOUS
  Filled 2021-12-01: qty 50

## 2021-12-01 MED ORDER — ONDANSETRON HCL 4 MG/2ML IJ SOLN
INTRAMUSCULAR | Status: AC
  Filled 2021-12-01: qty 4

## 2021-12-01 MED ORDER — LIDOCAINE HCL (PF) 2 % IJ SOLN
INTRAMUSCULAR | Status: AC
  Filled 2021-12-01: qty 5

## 2021-12-01 MED ORDER — CEFAZOLIN SODIUM-DEXTROSE 2-3 GM-%(50ML) IV SOLR
INTRAVENOUS | Status: DC | PRN
  Administered 2021-12-01: 2 g via INTRAVENOUS

## 2021-12-01 MED ORDER — DEXTROSE IN LACTATED RINGERS 5 % IV SOLN: INTRAVENOUS | Status: AC

## 2021-12-01 MED ORDER — LACTATED RINGERS IV SOLN: INTRAVENOUS | Status: DC

## 2021-12-01 MED ORDER — SUGAMMADEX SODIUM 200 MG/2ML IV SOLN
INTRAVENOUS | Status: DC | PRN
  Administered 2021-12-01: 200 mg via INTRAVENOUS

## 2021-12-01 MED ORDER — ORAL CARE MOUTH RINSE: 15.0000 mL | Freq: Once | OROMUCOSAL | Status: AC

## 2021-12-01 MED ORDER — ORAL CARE MOUTH RINSE: 15.0000 mL | OROMUCOSAL | Status: DC | PRN

## 2021-12-01 MED ORDER — BUPIVACAINE LIPOSOME 1.3 % IJ SUSP
INTRAMUSCULAR | Status: DC | PRN
  Administered 2021-12-01: 20 mL

## 2021-12-01 MED ORDER — CHLORHEXIDINE GLUCONATE 0.12 % MT SOLN
15.0000 mL | Freq: Once | OROMUCOSAL | Status: AC
  Administered 2021-12-01: 15 mL via OROMUCOSAL

## 2021-12-01 MED ORDER — BUPIVACAINE HCL (300 MG DOSE) 3 X 100 MG IL IMPL
DRUG_IMPLANT | Status: AC
  Filled 2021-12-01: qty 100

## 2021-12-01 MED ORDER — SODIUM CHLORIDE 0.9 % IV SOLN
1.0000 g | Freq: Two times a day (BID) | INTRAVENOUS | Status: AC
  Administered 2021-12-01 – 2021-12-02 (×3): 1 g via INTRAVENOUS
  Filled 2021-12-01 (×3): qty 1

## 2021-12-01 MED ORDER — BUPIVACAINE LIPOSOME 1.3 % IJ SUSP
INTRAMUSCULAR | Status: AC
  Filled 2021-12-01: qty 20

## 2021-12-01 MED ORDER — ROCURONIUM BROMIDE 10 MG/ML (PF) SYRINGE
PREFILLED_SYRINGE | INTRAVENOUS | Status: DC | PRN
  Administered 2021-12-01: 20 mg via INTRAVENOUS
  Administered 2021-12-01: 40 mg via INTRAVENOUS

## 2021-12-01 MED ORDER — PROPOFOL 10 MG/ML IV BOLUS
INTRAVENOUS | Status: DC | PRN
  Administered 2021-12-01: 70 mg via INTRAVENOUS

## 2021-12-01 MED ORDER — SUCCINYLCHOLINE CHLORIDE 200 MG/10ML IV SOSY
PREFILLED_SYRINGE | INTRAVENOUS | Status: DC | PRN
  Administered 2021-12-01: 100 mg via INTRAVENOUS

## 2021-12-01 MED ORDER — FENTANYL CITRATE (PF) 100 MCG/2ML IJ SOLN
INTRAMUSCULAR | Status: DC | PRN
  Administered 2021-12-01 (×6): 50 ug via INTRAVENOUS

## 2021-12-01 MED ORDER — ONDANSETRON HCL 4 MG/2ML IJ SOLN
INTRAMUSCULAR | Status: DC | PRN
  Administered 2021-12-01: 4 mg via INTRAVENOUS

## 2021-12-01 MED ORDER — CEFAZOLIN SODIUM-DEXTROSE 2-4 GM/100ML-% IV SOLN
INTRAVENOUS | Status: AC
  Filled 2021-12-01: qty 100

## 2021-12-01 MED ORDER — LIDOCAINE 2% (20 MG/ML) 5 ML SYRINGE
INTRAMUSCULAR | Status: DC | PRN
  Administered 2021-12-01: 40 mg via INTRAVENOUS

## 2021-12-01 MED ORDER — SODIUM CHLORIDE 0.9 % IR SOLN
Status: DC | PRN
  Administered 2021-12-01 (×2): 1000 mL

## 2021-12-01 MED ORDER — DEXAMETHASONE SODIUM PHOSPHATE 10 MG/ML IJ SOLN
INTRAMUSCULAR | Status: AC
  Filled 2021-12-01: qty 1

## 2021-12-01 MED ORDER — ROCURONIUM BROMIDE 10 MG/ML (PF) SYRINGE
PREFILLED_SYRINGE | INTRAVENOUS | Status: AC
  Filled 2021-12-01: qty 10

## 2021-12-01 MED ORDER — PHENYLEPHRINE 80 MCG/ML (10ML) SYRINGE FOR IV PUSH (FOR BLOOD PRESSURE SUPPORT)
PREFILLED_SYRINGE | INTRAVENOUS | Status: AC
  Filled 2021-12-01: qty 10

## 2021-12-01 MED ORDER — DEXAMETHASONE SODIUM PHOSPHATE 10 MG/ML IJ SOLN
INTRAMUSCULAR | Status: DC | PRN
  Administered 2021-12-01: 10 mg via INTRAVENOUS

## 2021-12-01 SURGICAL SUPPLY — 49 items
ADH SKN CLS APL DERMABOND .7 (GAUZE/BANDAGES/DRESSINGS) ×3
CLOTH BEACON ORANGE TIMEOUT ST (SAFETY) ×4 IMPLANT
COVER LIGHT HANDLE STERIS (MISCELLANEOUS) ×8 IMPLANT
DERMABOND ADVANCED .7 DNX12 (GAUZE/BANDAGES/DRESSINGS) ×4 IMPLANT
DRSG OPSITE POSTOP 4X8 (GAUZE/BANDAGES/DRESSINGS) ×1 IMPLANT
ELECT BLADE 6 FLAT ULTRCLN (ELECTRODE) ×1 IMPLANT
ELECT REM PT RETURN 9FT ADLT (ELECTROSURGICAL) ×3
ELECTRODE REM PT RTRN 9FT ADLT (ELECTROSURGICAL) ×4 IMPLANT
GAUZE 4X4 16PLY ~~LOC~~+RFID DBL (SPONGE) ×1 IMPLANT
GAUZE SPONGE 4X4 12PLY STRL (GAUZE/BANDAGES/DRESSINGS) ×4 IMPLANT
GLOVE BIO SURGEON STRL SZ 6.5 (GLOVE) ×5 IMPLANT
GLOVE BIOGEL PI IND STRL 6.5 (GLOVE) ×4 IMPLANT
GLOVE BIOGEL PI IND STRL 7.0 (GLOVE) ×9 IMPLANT
GLOVE ECLIPSE 6.5 STRL STRAW (GLOVE) ×2 IMPLANT
GLOVE SURG SS PI 7.0 STRL IVOR (GLOVE) ×2 IMPLANT
GOWN STRL REUS W/TWL LRG LVL3 (GOWN DISPOSABLE) ×12 IMPLANT
INST SET MINOR GENERAL (KITS) ×4 IMPLANT
KIT BLADEGUARD II DBL (SET/KITS/TRAYS/PACK) ×1 IMPLANT
KIT TURNOVER KIT A (KITS) ×4 IMPLANT
LIGASURE IMPACT 36 18CM CVD LR (INSTRUMENTS) ×1 IMPLANT
MANIFOLD NEPTUNE II (INSTRUMENTS) ×4 IMPLANT
NDL HYPO 21X1.5 SAFETY (NEEDLE) IMPLANT
NEEDLE HYPO 21X1.5 SAFETY (NEEDLE) ×3 IMPLANT
NS IRRIG 1000ML POUR BTL (IV SOLUTION) ×5 IMPLANT
PACK MINOR (CUSTOM PROCEDURE TRAY) ×4 IMPLANT
PAD ARMBOARD 7.5X6 YLW CONV (MISCELLANEOUS) ×4 IMPLANT
RELOAD LINEAR CUT PROX 55 BLUE (ENDOMECHANICALS) ×6 IMPLANT
RELOAD PROXIMATE 75MM BLUE (ENDOMECHANICALS) ×9 IMPLANT
RELOAD STAPLE 55 3.8 BLU REG (ENDOMECHANICALS) IMPLANT
RELOAD STAPLE 75 3.8 BLU REG (ENDOMECHANICALS) IMPLANT
SET BASIN LINEN APH (SET/KITS/TRAYS/PACK) ×4 IMPLANT
SOL PREP PROV IODINE SCRUB 4OZ (MISCELLANEOUS) ×4 IMPLANT
SPONGE T-LAP 18X18 ~~LOC~~+RFID (SPONGE) ×2 IMPLANT
SPONGE T-LAP 4X18 ~~LOC~~+RFID (SPONGE) ×1 IMPLANT
STAPLER GUN LINEAR PROX 60 (STAPLE) ×2 IMPLANT
STAPLER PROXIMATE 55 BLUE (STAPLE) ×1 IMPLANT
STAPLER PROXIMATE 75MM BLUE (STAPLE) ×1 IMPLANT
STAPLER VISISTAT (STAPLE) ×1 IMPLANT
SUT ETHIBOND NAB MO 7 #0 18IN (SUTURE) ×2 IMPLANT
SUT MNCRL AB 4-0 PS2 18 (SUTURE) ×4 IMPLANT
SUT NOVA NAB GS-22 2 2-0 T-19 (SUTURE) ×2 IMPLANT
SUT SILK 3 0 SH CR/8 (SUTURE) ×4 IMPLANT
SUT VIC AB 2-0 CT1 27 (SUTURE) ×6
SUT VIC AB 2-0 CT1 TAPERPNT 27 (SUTURE) ×5 IMPLANT
SUT VIC AB 3-0 SH 27 (SUTURE) ×6
SUT VIC AB 3-0 SH 27X BRD (SUTURE) ×5 IMPLANT
SYR 30ML LL (SYRINGE) ×1 IMPLANT
SYR BULB IRRIG 60ML STRL (SYRINGE) ×1 IMPLANT
YANKAUER SUCT BULB TIP NO VENT (SUCTIONS) ×1 IMPLANT

## 2021-12-01 NOTE — Anesthesia Postprocedure Evaluation (Signed)
Anesthesia Post Note  Patient: Robin [name]  Procedure(s) Performed: SMALL BOWEL RESECTION (Right: Abdomen) HERNIA REPAIR FEMORAL, McVay Repair (Pelvis)  Patient location during evaluation: PACU Anesthesia Type: General Level of consciousness: awake and alert and oriented Pain management: pain level controlled Vital Signs Assessment: post-procedure vital signs reviewed and stable Respiratory status: spontaneous breathing, nonlabored ventilation and respiratory function stable Cardiovascular status: blood pressure returned to baseline and stable Postop Assessment: no apparent nausea or vomiting Anesthetic complications: no   No notable events documented.   Last Vitals:  Vitals:   12/01/21 1330 12/01/21 1355  BP: (!) 141/79 (!) 145/67  Pulse: 97 90  Resp: 16 18  Temp: (!) 36.4 C 36.8 C  SpO2: 100% 95%    Last Pain:  Vitals:   12/01/21 1330  TempSrc:   PainSc: 0-No pain                 Atalya Dano C Amonte Brookover

## 2021-12-01 NOTE — Plan of Care (Signed)
  Problem: Activity: Goal: Risk for activity intolerance will decrease Outcome: Progressing   Problem: Nutrition: Goal: Adequate nutrition will be maintained Outcome: Progressing   Problem: Pain Managment: Goal: General experience of comfort will improve Outcome: Progressing   Problem: Safety: Goal: Ability to remain free from injury will improve Outcome: Progressing   

## 2021-12-01 NOTE — Anesthesia Preprocedure Evaluation (Addendum)
Anesthesia Evaluation  Patient identified by MRN, date of birth, ID band Patient awake    Reviewed: Allergy & Precautions, NPO status , Patient's Chart, lab work & pertinent test results  Airway Mallampati: II  TM Distance: >3 FB Neck ROM: Full    Dental  (+) Edentulous Upper, Edentulous Lower   Pulmonary neg pulmonary ROS, former smoker,    Pulmonary exam normal breath sounds clear to auscultation       Cardiovascular Exercise Tolerance: Good hypertension, Pt. on medications Normal cardiovascular exam Rhythm:Regular Rate:Normal     Neuro/Psych PSYCHIATRIC DISORDERS Anxiety negative neurological ROS     GI/Hepatic negative GI ROS, Neg liver ROS,   Endo/Other  negative endocrine ROS  Renal/GU Renal disease  negative genitourinary   Musculoskeletal negative musculoskeletal ROS (+)   Abdominal   Peds negative pediatric ROS (+)  Hematology negative hematology ROS (+)   Anesthesia Other Findings   Reproductive/Obstetrics negative OB ROS                            Anesthesia Physical Anesthesia Plan  ASA: 3  Anesthesia Plan: General   Post-op Pain Management: Minimal or no pain anticipated   Induction: Intravenous, Rapid sequence and Cricoid pressure planned  PONV Risk Score and Plan: 4 or greater and Ondansetron and Dexamethasone  Airway Management Planned: Oral ETT  Additional Equipment:   Intra-op Plan:   Post-operative Plan: Extubation in OR and Possible Post-op intubation/ventilation  Informed Consent: I have reviewed the patients History and Physical, chart, labs and discussed the procedure including the risks, benefits and alternatives for the proposed anesthesia with the patient or authorized representative who has indicated his/her understanding and acceptance.     Dental advisory given  Plan Discussed with: CRNA and Surgeon  Anesthesia Plan Comments:         Anesthesia Quick Evaluation

## 2021-12-01 NOTE — Progress Notes (Signed)
  Transition of Care [facility]) Screening Note   Patient Details  Name: Robin [name] Date of Birth: 1940/10/15   Transition of Care [facility]) CM/SW Contact:    Iona Beard, [place] Phone Number: 12/01/2021, 10:39 AM    Transition of Care Department [facility]) has reviewed patient and no TOC needs have been identified at this time. We will continue to monitor patient advancement through interdisciplinary progression rounds. If new patient transition needs arise, please place a TOC consult.

## 2021-12-01 NOTE — Progress Notes (Addendum)
[facility]  Updated team and family. Patient with SBR and will keep NG until having bowel function.   Incarcerated strangulated femoral hernia post op diagnosis.   PRN For pain IS, OOB NPO, NG CXR with NG in good position Will do a few more doses of cefotetan to be safe given the resection  Curlene Labrum, MD [facility] [address] [place], [place] [address] [phone] (office)

## 2021-12-01 NOTE — Anesthesia Procedure Notes (Signed)
Procedure Name: Intubation Date/Time: 12/01/2021 10:45 AM  Performed by: Myna Bright, CRNAPre-anesthesia Checklist: Patient identified, Emergency Drugs available, Suction available and Patient being monitored Patient Re-evaluated:Patient Re-evaluated prior to induction Oxygen Delivery Method: Circle system utilized Preoxygenation: Pre-oxygenation with 100% oxygen Induction Type: IV induction, Rapid sequence and Cricoid Pressure applied Laryngoscope Size: Mac and 3 Grade View: Grade I Tube type: Oral Tube size: 7.0 mm Number of attempts: 1 Airway Equipment and Method: Stylet Placement Confirmation: ETT inserted through vocal cords under direct vision, positive ETCO2 and breath sounds checked- equal and bilateral Secured at: 21 cm Tube secured with: Tape Dental Injury: Teeth and Oropharynx as per pre-operative assessment

## 2021-12-01 NOTE — Progress Notes (Signed)
Communication sent to Dr. Constance Haw and Dr. Josephine Cables related to inability to insert the NG tube in patient, as ordered. Two nurses attempted to insert with no success. Patient communicates that she had a previous fracture to the left nare.

## 2021-12-01 NOTE — Consult Note (Signed)
[facility] Consult  Reason for Consult: Incarcerated inguinal hernia right  Referring Physician: ED  Chief Complaint   Abdominal Pain     HPI: Robin [name] is a 81 y.o. female with HTN, HLD who came to the hospital with worsening R inguinal pain after picking up boxes. She had some associated nausea and vomiting and says the she came into the ED for evaluation.  She has had this hernia for about 4 years she thinks but it has never been stuck out like this.   She reports having surgery for Crohn's disease in the past. Multiple Rns tried to get an NG in overnight without success.   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 Sharlett Iles: 5cm circumferential fungating tumor at the hepatic flexure, path tubulovillous adenoma.    COLONOSCOPY  01/02/2006   Dr. 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 Multiple polyps around anastomosis of different sizes,    COLONOSCOPY  02/21/2006   Dr. Sharlett Iles. Referred to Dr. Georgette Dover for resection   COLONOSCOPY N/A 07/18/2014   Procedure: COLONOSCOPY;  Surgeon: Daneil Dolin, MD;  [facility]: [facility];  Service: Endoscopy;  Laterality: N/A;  1230   COLONOSCOPY WITH PROPOFOL N/A 04/22/2020   Procedure: COLONOSCOPY WITH PROPOFOL;  Surgeon: Rogene [place], MD;  Location: [facility];  Service: Endoscopy;  Laterality: N/A;  am   EYE SURGERY     LAPAROSCOPIC LYSIS OF ADHESIONS     PARTIAL COLECTOMY  02/21/2005   due to tubulovillous  adenoma   TUBAL LIGATION      Family History  Problem Relation Age of Onset   Diabetes Mother    Heart disease Mother    Cancer Mother        patient unsure of type   Hip fracture Mother    Heart disease Father        antigioplasty   Cancer Sister        lung   COPD Sister    Heart disease Sister    Atrial fibrillation Sister    Heart disease Sister        stent in left leg   Varicose Veins Sister    Deep vein thrombosis Sister    Heart disease Brother    COPD Brother    COPD Brother    Huntington's disease Daughter    Carpal tunnel syndrome Daughter    Brain cancer Son    Colon cancer Neg Hx     Social History   Tobacco Use   Smoking status: Former    Years: 1.00    Types: Cigarettes    Quit date: 11/01/1959    Years since quitting: 62.1   Smokeless tobacco: Never  Vaping Use   Vaping Use: Never used  Substance Use Topics   Alcohol use: No    Alcohol/week: 0.0 standard drinks of alcohol   Drug use: No    Medications: I have reviewed  the patient's current medications. Prior to Admission:  Medications Prior to Admission  Medication Sig Dispense Refill Last Dose   aspirin EC 81 MG tablet Take 81 mg by mouth daily.      atorvastatin (LIPITOR) 80 MG tablet Take 1 tablet (80 mg total) by mouth daily. 90 tablet 1    Cholecalciferol (VITAMIN D) 2000 UNITS tablet Take 4000IU on saturdays and sundays and 2000IU all other days. (Patient taking differently: Take 2,000 Units by mouth See admin instructions. Take 1 tablet (2000 units) on Mondays through Fridays and take 2 tablets (4000 units) by mouth on Saturdays & Sundays.)      denosumab (PROLIA) 60 MG/ML SOSY injection BRING TO OFFICE FOR ADMINISTRATION. ADMINISTER IN UPPER ARM, THIGH OR ABDOMEN 1 mL 0    ENSURE PLUS (ENSURE PLUS) LIQD Take 1 Can by mouth daily. Dx:  R63.6 (underweight) 7110 mL 2    escitalopram (LEXAPRO) 10 MG tablet Take 1 tablet (10 mg total) by mouth daily. 90 tablet 1    lisinopril (ZESTRIL) 10 MG  tablet Take 1 tablet (10 mg total) by mouth daily. 90 tablet 1    Scheduled:  influenza vaccine adjuvanted  0.5 mL Intramuscular Tomorrow-1000   Continuous:  sodium chloride 50 mL/hr at 12/01/21 0749   magnesium sulfate bolus IVPB     UDJ:SHFWYOVZCHYIF **OR** acetaminophen, hydrALAZINE, morphine injection, ondansetron **OR** ondansetron (ZOFRAN) IV, mouth rinse  No Known Allergies   ROS:  A comprehensive review of systems was negative except for: Gastrointestinal: positive for nausea, vomiting, and right groin pain  Blood pressure 133/76, pulse (!) 57, temperature (!) 97.2 F (36.2 C), resp. rate 14, height '5\' 3"'$  (1.6 m), weight 42.5 kg, SpO2 99 %. Physical Exam Vitals reviewed.  Constitutional:      Appearance: She is underweight.  HENT:     Head: Normocephalic.  Eyes:     Extraocular Movements: Extraocular movements intact.  Cardiovascular:     Rate and Rhythm: Normal rate.  Pulmonary:     Effort: Pulmonary effort is normal.  Abdominal:     General: There is no distension.     Palpations: Abdomen is soft.     Tenderness: There is abdominal tenderness.     Hernia: A hernia is present. Hernia is present in the right inguinal area.     Comments: Right inguinal region tender, no skin changes, incarcerated hernia   Skin:    General: Skin is warm.  Neurological:     General: No focal deficit present.     Mental Status: She is alert and oriented to person, place, and time.  Psychiatric:        Mood and Affect: Mood normal.        Behavior: Behavior normal.     Results: Results for orders placed or performed during the hospital encounter of 11/30/21 (from the past 48 hour(s))  CBC with Differential     Status: None   Collection Time: 11/30/21  4:20 PM  Result Value Ref Range   WBC 9.1 4.0 - 10.5 K/uL   RBC 4.32 3.87 - 5.11 MIL/uL   Hemoglobin 12.8 12.0 - 15.0 g/dL   HCT 39.5 36.0 - 46.0 %   MCV 91.4 80.0 - 100.0 fL   MCH 29.6 26.0 - 34.0 pg   MCHC 32.4 30.0 - 36.0  g/dL   RDW 13.4 11.5 - 15.5 %   Platelets 308 150 - 400 K/uL   nRBC 0.0 0.0 - 0.2 %   Neutrophils Relative %  84 %   Neutro Abs 7.7 1.7 - 7.7 K/uL   Lymphocytes Relative 9 %   Lymphs Abs 0.8 0.7 - 4.0 K/uL   Monocytes Relative 6 %   Monocytes Absolute 0.5 0.1 - 1.0 K/uL   Eosinophils Relative 0 %   Eosinophils Absolute 0.0 0.0 - 0.5 K/uL   Basophils Relative 0 %   Basophils Absolute 0.0 0.0 - 0.1 K/uL   Immature Granulocytes 1 %   Abs Immature Granulocytes 0.06 0.00 - 0.07 K/uL    Comment: Performed at [facility], [address] [place], [place] [address]  Comprehensive metabolic panel     Status: Abnormal   Collection Time: 11/30/21  4:20 PM  Result Value Ref Range   Sodium 139 135 - 145 mmol/L   Potassium 5.5 (H) 3.5 - 5.1 mmol/L   Chloride 105 98 - 111 mmol/L   CO2 23 22 - 32 mmol/L   Glucose, Bld 124 (H) 70 - 99 mg/dL    Comment: Glucose reference range applies only to samples taken after fasting for at least 8 hours.   BUN 34 (H) 8 - 23 mg/dL   Creatinine, Ser 1.67 (H) 0.44 - 1.00 mg/dL   Calcium 10.4 (H) 8.9 - 10.3 mg/dL   Total Protein 7.6 6.5 - 8.1 g/dL   Albumin 4.5 3.5 - 5.0 g/dL   AST 21 15 - 41 U/L   ALT 11 0 - 44 U/L   Alkaline Phosphatase 57 38 - 126 U/L   Total Bilirubin 1.2 0.3 - 1.2 mg/dL   GFR, Estimated 31 (L) >60 mL/min    Comment: (NOTE) Calculated using the CKD-EPI Creatinine Equation (2021)    Anion gap 11 5 - 15    Comment: Performed at [facility], [address] [place], [place] [address]  I-stat chem 8, ED (not at [facility] or [facility])     Status: Abnormal   Collection Time: 11/30/21  4:33 PM  Result Value Ref Range   Sodium 135 135 - 145 mmol/L   Potassium 5.4 (H) 3.5 - 5.1 mmol/L   Chloride 108 98 - 111 mmol/L   BUN 31 (H) 8 - 23 mg/dL   Creatinine, Ser 1.70 (H) 0.44 - 1.00 mg/dL   Glucose, Bld 122 (H) 70 - 99 mg/dL    Comment: Glucose reference range applies only to samples taken after fasting for at least 8 hours.   Calcium, Ion 1.25 1.15 -  1.40 mmol/L   TCO2 23 22 - 32 mmol/L   Hemoglobin 12.9 12.0 - 15.0 g/dL   HCT 38.0 36.0 - 46.0 %  Lactic acid, plasma     Status: None   Collection Time: 11/30/21  4:48 PM  Result Value Ref Range   Lactic Acid, Venous 1.6 0.5 - 1.9 mmol/L    Comment: Performed at [facility], [address], [place], [place] [address]  Lactic acid, plasma     Status: None   Collection Time: 11/30/21  7:13 PM  Result Value Ref Range   Lactic Acid, Venous 1.4 0.5 - 1.9 mmol/L    Comment: Performed at [facility], [address] [place], [place] [address]  Comprehensive metabolic panel     Status: Abnormal   Collection Time: 12/01/21  4:38 AM  Result Value Ref Range   Sodium 139 135 - 145 mmol/L   Potassium 4.6 3.5 - 5.1 mmol/L   Chloride 112 (H) 98 - 111 mmol/L   CO2 20 (L) 22 - 32 mmol/L  Glucose, Bld 118 (H) 70 - 99 mg/dL    Comment: Glucose reference range applies only to samples taken after fasting for at least 8 hours.   BUN 25 (H) 8 - 23 mg/dL   Creatinine, Ser 1.20 (H) 0.44 - 1.00 mg/dL   Calcium 8.4 (L) 8.9 - 10.3 mg/dL   Total Protein 5.6 (L) 6.5 - 8.1 g/dL   Albumin 3.3 (L) 3.5 - 5.0 g/dL   AST 15 15 - 41 U/L   ALT 9 0 - 44 U/L   Alkaline Phosphatase 40 38 - 126 U/L   Total Bilirubin 1.1 0.3 - 1.2 mg/dL   GFR, Estimated 46 (L) >60 mL/min    Comment: (NOTE) Calculated using the CKD-EPI Creatinine Equation (2021)    Anion gap 7 5 - 15    Comment: Performed at [facility], [address] [place], [place] [address]  CBC     Status: Abnormal   Collection Time: 12/01/21  4:38 AM  Result Value Ref Range   WBC 8.0 4.0 - 10.5 K/uL   RBC 3.30 (L) 3.87 - 5.11 MIL/uL   Hemoglobin 9.9 (L) 12.0 - 15.0 g/dL   HCT 30.7 (L) 36.0 - 46.0 %   MCV 93.0 80.0 - 100.0 fL   MCH 30.0 26.0 - 34.0 pg   MCHC 32.2 30.0 - 36.0 g/dL   RDW 13.6 11.5 - 15.5 %   Platelets 227 150 - 400 K/uL   nRBC 0.0 0.0 - 0.2 %    Comment: Performed at [facility], [address], [place], [place] [address]  Magnesium      Status: Abnormal   Collection Time: 12/01/21  4:38 AM  Result Value Ref Range   Magnesium 1.4 (L) 1.7 - 2.4 mg/dL    Comment: Performed at [facility], [address] [place], [place] [address]  Phosphorus     Status: None   Collection Time: 12/01/21  4:38 AM  Result Value Ref Range   Phosphorus 3.0 2.5 - 4.6 mg/dL    Comment: Performed at [facility], [address] [place], [place] [address]   Personally reviewed- this looks like inguinal where it comes out but there is a large hernia sac and contents CT ABDOMEN PELVIS W CONTRAST  Result Date: 11/30/2021 CLINICAL DATA:  Hernia, complicated possible incarcerated L inguinal hernia EXAM: CT ABDOMEN AND PELVIS WITH CONTRAST TECHNIQUE: Multidetector CT imaging of the abdomen and pelvis was performed using the standard protocol following bolus administration of intravenous contrast. RADIATION DOSE REDUCTION: This exam was performed according to the departmental dose-optimization program which includes automated exposure control, adjustment of the mA and/or kV according to patient size and/or use of iterative reconstruction technique. CONTRAST:  17m OMNIPAQUE IOHEXOL 300 MG/ML  SOLN COMPARISON:  None Available. FINDINGS: Lower chest: Clear lung bases. Hepatobiliary: 18 and 24 mm cysts in the left hepatic lobe. Additional scattered subcentimeter hypodensities are too small to accurately characterize but also likely cysts. No specific imaging follow-up is recommended. No suspicious liver lesion. No calcified gallstone or pericholecystic inflammation. Probable gallbladder Phrygian cap. No biliary dilatation. Pancreas: No ductal dilatation or inflammation. Spleen: Normal in size without focal abnormality. Small splenule inferiorly. 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 Given patient age and size, no specific imaging follow-up is needed. Adrenals/Urinary Tract: No adrenal nodule. No hydronephrosis. There is thinning of the bilateral renal  parenchyma, more so on the left. Multiple low-density lesions within both kidneys, majority are too small to characterize but  likely cysts. No imaging follow-up of these lesions is needed. However there is an exophytic 11 mm lesion arising from the upper left kidney with Hounsfield units of 83, indeterminate. No visible renal stones. Partially distended urinary bladder, normal for degree of distension. Stomach/Bowel: Right inguinal hernia with narrow neck contains a short segment of fluid-filled small bowel. There is no definite associated fat stranding or inflammation, although the small bowel proximal to this is prominent and mildly dilated. No bowel pneumatosis. The exiting small bowel is decompressed. Small hiatal hernia with otherwise decompressed stomach. The colon is near completely decompressed. There are enteric chain sutures in the ascending colon. The appendix is not seen. Vascular/Lymphatic: Moderate aortic atherosclerosis. No aneurysm. Patent portal and splenic veins. Patent mesenteric veins. No mesenteric venous gas. Reproductive: Uterus and bilateral adnexa are unremarkable. Other: Small to moderate volume of free fluid in the dependent pelvis is likely reactive. No free air or focal fluid collection. Musculoskeletal: The bones are diffusely under mineralized. Scoliosis and degenerative change in the spine. There are no acute or suspicious osseous abnormalities. IMPRESSION: 1. Right inguinal hernia with narrow neck contains a short segment of fluid-filled small bowel. There is no definite associated fat stranding or inflammation, although the small bowel proximal to this is prominent and mildly dilated, suspicious for early or partial small bowel obstruction. 2. Small to moderate volume of free fluid in the dependent pelvis is likely reactive. 3. Indeterminate 11 mm exophytic lesion arising from the upper left kidney. Recommend renal protocol MRI for characterization on a nonemergent basis. Aortic  Atherosclerosis (ICD10-I70.0). Electronically Signed   By: Keith Rake M.D.   On: 11/30/2021 18:57     Assessment & Plan:  Robin [name] is a 81 y.o. female with an inguinal hernia that is incarcerated on the right. Discussed potential need for bowel resection potential need for tissue repair and ex lap if I cannot resect the bowel through the inguinal canal. Discussed that if her bowel is compromised we would have to do these potential steps versus if she has healthy viable bowel we could fix the hernia the standard way. Discussed if we resected bowel she would need NG tube until she had bowel function.   I really do not think this is femoral because it is above the crease and following it on the CT scan it comes out medial near the pubic bone.   Discussed the risk and benefits including, bleeding, infection, use of mesh, risk of recurrence, risk of nerve damage causing numbness or changes in sensation, risk of damage to the cord structures. The patient understands the risk and benefits of repair with mesh, and has decided to proceed.  We also discussed open versus laparoscopic surgery and the use of mesh. We discussed that I do open repairs with mesh, and that this is considered equivalent to laparoscopic surgery. We discussed reasons for opting for laparoscopic surgery including if a bilateral repair is needed or if a patient has a recurrence after an open repair. We discussed the option of watch and wait in men and discussed that in 5 years some studies report that 40% of men have crossed over to needing a hernia repair because the hernia has become larger or symptomatic. We discussed that women are not appropriate candidate for watchful waiting due to the risk of femoral hernias.    All questions were answered to the satisfaction of the patient.    Virl Cagey 12/01/2021, 9:25 AM

## 2021-12-01 NOTE — Op Note (Signed)
[facility] Operative Note  12/01/21  Preoperative Diagnosis: Incarcerated inguinal hernia    Postoperative Diagnosis: Incarcerated right femoral hernia, ischemic small bowel    Procedure(s) Performed: Primary repair femoral hernia with McVay Repair, resection of small bowel and primary anastomosis    Surgeon: Lanell Matar. Constance Haw, MD   Assistants: No qualified resident was available   Anesthesia: General endotracheal   Anesthesiologist: Denese Killings, MD    Specimens: None    Estimated Blood Loss: Minimal   Blood Replacement: None    Complications: None   Wound Class: Contaminated    Operative Indications: Robin [name] is an 81 yo who came in to the ED with an inguinal hernia on CT scan and concern for incarcerated but not strangulation. She had no leukocytosis and her pain was minimal and she was brought in overnight for plans to repair in the AM as she has hyperkalemia and needed some resuscitation also. We discussed her repair and risk of bleeding, infection, nerve injury, use of mesh versus primary repair, needing to do a bowel resection, exploratory laparotomy, and risk of recurrence.   Findings: Femoral hernia with incarcerated bowel, ischemic bowel, thin/ frail inguinal ligament/ and conjoint tendon    Procedure: The patient was taken to the operating room and placed supine. General endotracheal anesthesia was induced. Intravenous antibiotics were administered per protocol.  An OG was placed. A time out was preformed verifying the correct patient, procedure, site, positioning and implants.  The groin and abdomen were prepared and draped in the usual sterile fashion pending a need for an exploratory laparotomy.   An incision was made in a natural skin crease between the pubic tubercle and the anterior superior iliac spine.  The incision was deepened with electrocautery through Scarpa's and Camper's fascia until the aponeurosis of the external oblique was  encountered.  The hernia sac was very prominent and full and I could not even identify the external oblique initailly because the sac was overlying the external oblique. After dissecting around the sac, I realized that this was in fact a femoral hernia at the very medial aspect and pinned under the inguinal ligament. I was able to open up the external oblique with an incision was made in the midportion of the external oblique aponeurosis in the direction of its fibers. The ilioinguinal nerve was identified and was protected throughout the dissection.  Flaps of the external oblique were developed cephalad and inferiorly.  I could not get the hernia to reduce, and I had to cut the inguinal ligament and floor to get the hernia to open as the defect was less than 1.5cm in size.  After I cut the inguinal ligament and floor medially, I was able to open the sac and see that there was a knuckle of small bowel that had hemorrhagic ischemic changes. We stayed there for a moment but this area was going to need to be resected. A small bowel resection was done in the standard fashion through this defect. Linear 55 mm cutting staplers were used to take the knuckle of small bowel and the ligasure was used to take the mesentery. About 5cm of bowel was resected.  A standard side to side anastomosis was performed with a linear 55 mm staple and a TA 60.  The staple line was oversewn with lembert 3-0 Silk sutures and crotch sutures were placed X 2.  When I tried to get the bowel back into the peritoneal cavity, I noted a loop that was adherent  to the peritoneum / hernia sac anteriorly, and this was pinning the small in place. I wanted to be able to see proximal and distal to the anastomosis further, and after I swept down these adhesions with my finger, I was able to see the bowel and eviscerate more bowel.  Unfortunately, there was a serosal tear from this, and I did not like the width of the anastomosis lumen and I worried about it  being tight.  Therefore, I opted to perform the a repeat bowel resection, removing about another 10cm of bowel, and performing a side to side anastomosis again in the standard fashion with a 75 mm linear cutting stapler this time and a TA 60. This allowed the lumen to have a larger opening. The staple line was oversewn again in the Lembert fashion with 3-0 Silk suture and the mesenteric defect was closed with 3-0 Silk and 2 crotch sutures were placed.  The anastomosis was widely patent and all the bowel proximal and distal was healthy and without any adhesions tethering it down into the hernia sac. I then closed the hernia sac safely with 2-0 Vicryl locking suture.  I had to recreate the inguinal ligament medially with 0 Ethibond suture, covering up the femoral vessels and reforming the medial edge with the pubic symphysis.   Then I identified the conjoint tendon and made a relaxing incision. The conjoint and the inguinal ligament tissue was very poor and frail.  The conjoint tendon reached down to cooper's ligament with no tension and I secured it here with 0 Ethibond suture.  I then sutured the conjoint to the shelving edge of the inguinal ligament as I transitioned and carried this out laterally, ensuring I completed the transitioned incorporating the femoral vessel sheath and I did not endanger the femoral vessels.  The internal ring was completely obliterated. Hemostasis was confirmed.   The external oblique aponeurosis was closed with a 2-0 Vicryl suture in a running fashion, taking care to not catch the ilioinguinal nerve in the suture line.  Scarpa's fashion was closed with 3-0 Vicryl interrupted sutures. The skin was closed with staples and a honeycomb dressing. Exparel was injected.   The patient tolerated the procedure well and was taken to the PACU in stable condition. She had an OG changed to an NG intraoperatively. All counts were correct at the end of the case.        Robin Labrum,  MD [facility] [address] [place], [place] [address] [phone] (office)

## 2021-12-01 NOTE — Progress Notes (Signed)
PROGRESS NOTE  Robin [name] UVO:[id number] DOB: 1940-04-09   PCP: Chevis Pretty, FNP  Patient is from: Home  DOA: 11/30/2021 LOS: 1  Chief complaints Chief Complaint  Patient presents with   Abdominal Pain     Brief Narrative / Interim history: 82 year old F with PMH of HTN, HLD, osteoporosis and vitamin D deficiency presenting to ED with increased right inguinal pain after picking up a box, and admitted for partial SBO in the setting of right femoral hernia.  General surgery consulted, she underwent primary repair of femoral hernia with McVay repair and resection of the small bowel and primary anastomosis.    Subjective: Seen and examined this afternoon after she returned from surgery.  She is resting comfortably without distress.  Wakes to voice easily.  Pain fairly controlled.  NG tube in place.  Objective: Vitals:   12/01/21 0950 12/01/21 1318 12/01/21 1330 12/01/21 1355  BP: 114/60 (!) 129/90 (!) 141/79 (!) 145/67  Pulse:  (!) 121 97 90  Resp:  '15 16 18  '$ Temp: 97.8 F (36.6 C) (!) 97.4 F (36.3 C) (!) 97.5 F (36.4 C) 98.2 F (36.8 C)  TempSrc:      SpO2:  100% 100% 95%  Weight:      Height:        Examination:  GENERAL: No apparent distress.  Nontoxic. HEENT: MMM.  Vision and hearing grossly intact.  NECK: Supple.  No apparent JVD.  RESP:  No IWOB.  Fair aeration bilaterally. CVS:  RRR. Heart sounds normal.  ABD/GI/GU: BS+. Abd soft, NTND.  MSK/EXT:  Moves extremities. No apparent deformity. No edema.  SKIN: Honeycomb dressing DCI over right groin area.  NEURO: Awake, alert and oriented appropriately.  No apparent focal neuro deficit. PSYCH: Calm. Normal affect.   Procedures:  10/11-Primary repair femoral hernia with McVay Repair, resection of small bowel and primary anastomosis   Microbiology summarized: None  Assessment and plan: Principal Problem:   Strangulated femoral hernia Active Problems:   Osteoporosis   Mixed hyperlipidemia    Vitamin D insufficiency   Underweight   Essential hypertension   Abdominal pain   SBO (small bowel obstruction) (HCC)   Hyperkalemia   Hypercalcemia   Renal lesion   Failure to thrive in adult   Vomiting  Strangulated femoral hernia: s/p hernia repair and resection of small bowel with primary anastomosis -NGT in place for bowel rest -General surgery following -N.p.o. and IV fluid -IV antiemetics and analgesics   AKI: Improved. Recent Labs    12/21/20 1522 11/30/21 1620 11/30/21 1633 12/01/21 0438  BUN 8 34* 31* 25*  CREATININE 0.75 1.67* 1.70* 1.20*  -Continue IV fluid -Avoid nephrotoxic meds  Hyperkalemia: In the setting of AKI.  Resolved Non-anion gap metabolic acidosis: In the setting of AKI. -Continue monitoring  Hypomagnesemia -IV magnesium sulfate 2 g x 1   Normocytic anemia: Hgb seems to be at baseline. Recent Labs    12/21/20 1522 11/30/21 1620 11/30/21 1633 12/01/21 0438  HGB 10.8* 12.8 12.9 9.9*  -Monitor H&H -Check anemia panel  Left renal lesion: CT A/P showed indeterminate 11 mm exophytic lesion arising from the upper left kidney -Renal MRI on a nonemergent basis was recommended   Essential hypertension: Blood pressure within acceptable range. -Continue IV hydralazine 10 mg every 6 hours as needed for SBP > 170   Mixed hyperlipidemia Osteoporosis Vitamin D deficiency -Continue oral meds when patient resumes oral intake   Failure to thrive in an adult/underweight Body mass index is  16.6 kg/m. -Consult dietitian once she started taking p.o.          DVT prophylaxis:  SCDs Start: 11/30/21 2303  Code Status: Full code Family Communication: None at bedside Level of care: Telemetry Status is: Inpatient Remains inpatient appropriate because: Strangulated femoral hernia   Final disposition: TBD Consultants:  General surgery  Sch Meds:  Scheduled Meds:  influenza vaccine adjuvanted  0.5 mL Intramuscular Tomorrow-1000    Continuous Infusions:  ceFAZolin Stopped (12/01/21 1032)   cefoTEtan (CEFOTAN) IV     dextrose 5% lactated ringers     PRN Meds:.acetaminophen **OR** acetaminophen, ceFAZolin, hydrALAZINE, morphine injection, ondansetron **OR** ondansetron (ZOFRAN) IV, mouth rinse  Antimicrobials: Anti-infectives (From admission, onward)    Start     Dose/Rate Route Frequency Ordered Stop   12/01/21 2000  cefoTEtan (CEFOTAN) 1 g in sodium chloride 0.9 % 100 mL IVPB        1 g 200 mL/hr over 30 Minutes Intravenous Every 12 hours 12/01/21 1353 12/03/21 0959   12/01/21 1032  ceFAZolin (ANCEF) 2-4 GM/100ML-% IVPB       Note to Pharmacy: Myna Bright: cabinet override      12/01/21 1032 12/01/21 2244        I have personally reviewed the following labs and images: CBC: Recent Labs  Lab 11/30/21 1620 11/30/21 1633 12/01/21 0438  WBC 9.1  --  8.0  NEUTROABS 7.7  --   --   HGB 12.8 12.9 9.9*  HCT 39.5 38.0 30.7*  MCV 91.4  --  93.0  PLT 308  --  227   BMP &GFR Recent Labs  Lab 11/30/21 1620 11/30/21 1633 12/01/21 0438  NA 139 135 139  K 5.5* 5.4* 4.6  CL 105 108 112*  CO2 23  --  20*  GLUCOSE 124* 122* 118*  BUN 34* 31* 25*  CREATININE 1.67* 1.70* 1.20*  CALCIUM 10.4*  --  8.4*  MG  --   --  1.4*  PHOS  --   --  3.0   Estimated Creatinine Clearance: 25.1 mL/min (A) (by C-G formula based on SCr of 1.2 mg/dL (H)). Liver & Pancreas: Recent Labs  Lab 11/30/21 1620 12/01/21 0438  AST 21 15  ALT 11 9  ALKPHOS 57 40  BILITOT 1.2 1.1  PROT 7.6 5.6*  ALBUMIN 4.5 3.3*   No results for input(s): "LIPASE", "AMYLASE" in the last 168 hours. No results for input(s): "AMMONIA" in the last 168 hours. Diabetic: No results for input(s): "HGBA1C" in the last 72 hours. No results for input(s): "GLUCAP" in the last 168 hours. Cardiac Enzymes: No results for input(s): "CKTOTAL", "CKMB", "CKMBINDEX", "TROPONINI" in the last 168 hours. No results for input(s): "PROBNP" in the last  8760 hours. Coagulation Profile: No results for input(s): "INR", "PROTIME" in the last 168 hours. Thyroid Function Tests: No results for input(s): "TSH", "T4TOTAL", "FREET4", "T3FREE", "THYROIDAB" in the last 72 hours. Lipid Profile: No results for input(s): "CHOL", "HDL", "LDLCALC", "TRIG", "CHOLHDL", "LDLDIRECT" in the last 72 hours. Anemia Panel: No results for input(s): "VITAMINB12", "FOLATE", "FERRITIN", "TIBC", "IRON", "RETICCTPCT" in the last 72 hours. Urine analysis: No results found for: "COLORURINE", "APPEARANCEUR", "LABSPEC", "PHURINE", "GLUCOSEU", "HGBUR", "BILIRUBINUR", "KETONESUR", "PROTEINUR", "UROBILINOGEN", "NITRITE", "LEUKOCYTESUR" Sepsis Labs: Invalid input(s): "PROCALCITONIN", "LACTICIDVEN"  Microbiology: No results found for this or any previous visit (from the past 240 hour(s)).  Radiology Studies: DG Chest Port 1 View  Result Date: 12/01/2021 CLINICAL DATA:  [id number] Encounter for imaging study to confirm nasogastric (NG)  tube placement [id number] EXAM: PORTABLE CHEST 1 VIEW COMPARISON:  Radiograph 11/18/2019 FINDINGS: Nasogastric tube tip and side port overlie the stomach. Cardiomediastinal silhouette is within normal limits. Pulmonary hyperinflation. No airspace consolidation. No pleural effusion. No pneumothorax. 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 Dilated loop of small bowel in the right hemiabdomen. IMPRESSION: Nasogastric tube tip and side port overlie the stomach. Dilated loop of small bowel in the right hemiabdomen, potentially reflecting obstruction as noted on recent CT. Electronically Signed   By: Maurine Simmering M.D.   On: 12/01/2021 13:37   CT ABDOMEN PELVIS W CONTRAST  Result Date: 11/30/2021 CLINICAL DATA:  Hernia, complicated possible incarcerated L inguinal hernia EXAM: CT ABDOMEN AND PELVIS WITH CONTRAST TECHNIQUE: Multidetector CT imaging of the abdomen and pelvis was performed using the standard protocol following bolus  administration of intravenous contrast. RADIATION DOSE REDUCTION: This exam was performed according to the departmental dose-optimization program which includes automated exposure control, adjustment of the mA and/or kV according to patient size and/or use of iterative reconstruction technique. CONTRAST:  70m OMNIPAQUE IOHEXOL 300 MG/ML  SOLN COMPARISON:  None Available. FINDINGS: Lower chest: Clear lung bases. Hepatobiliary: 18 and 24 mm cysts in the left hepatic lobe. Additional scattered subcentimeter hypodensities are too small to accurately characterize but also likely cysts. No specific imaging follow-up is recommended. No suspicious liver lesion. No calcified gallstone or pericholecystic inflammation. Probable gallbladder Phrygian cap. No biliary dilatation. Pancreas: No ductal dilatation or inflammation. Spleen: Normal in size without focal abnormality. Small splenule inferiorly. 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 Given patient age and size, no specific imaging follow-up is needed. Adrenals/Urinary Tract: No adrenal nodule. No hydronephrosis. There is thinning of the bilateral renal parenchyma, more so on the left. Multiple low-density lesions within both kidneys, majority are too small to characterize but likely cysts. No imaging follow-up of these lesions is needed. However there is an exophytic 11 mm lesion arising from the upper left kidney with Hounsfield units of 83, indeterminate. No visible renal stones. Partially distended urinary bladder, normal for degree of distension. Stomach/Bowel: Right inguinal hernia with narrow neck contains a short segment of fluid-filled small bowel. There is no definite associated fat stranding or inflammation, although the small bowel proximal to this is prominent and mildly dilated. No bowel pneumatosis. The exiting small bowel is decompressed. Small hiatal hernia with otherwise decompressed stomach. The colon is near completely decompressed. There  are enteric chain sutures in the ascending colon. The appendix is not seen. Vascular/Lymphatic: Moderate aortic atherosclerosis. No aneurysm. Patent portal and splenic veins. Patent mesenteric veins. No mesenteric venous gas. Reproductive: Uterus and bilateral adnexa are unremarkable. Other: Small to moderate volume of free fluid in the dependent pelvis is likely reactive. No free air or focal fluid collection. Musculoskeletal: The bones are diffusely under mineralized. Scoliosis and degenerative change in the spine. There are no acute or suspicious osseous abnormalities. IMPRESSION: 1. Right inguinal hernia with narrow neck contains a short segment of fluid-filled small bowel. There is no definite associated fat stranding or inflammation, although the small bowel proximal to this is prominent and mildly dilated, suspicious for early or partial small bowel obstruction. 2. Small to moderate volume of free fluid in the dependent pelvis is likely reactive. 3. Indeterminate 11 mm exophytic lesion arising from the upper left kidney. Recommend renal protocol MRI for characterization on a nonemergent basis. Aortic Atherosclerosis (ICD10-I70.0). Electronically Signed   By: MKeith RakeM.D.   On:  11/30/2021 18:57      Brysan Mcevoy [place]  If 7PM-7AM, please contact night-coverage [email or web address] 12/01/2021, 4:12 PM

## 2021-12-01 NOTE — Transfer of Care (Signed)
Immediate Anesthesia Transfer of Care Note  Patient: Robin [name]  Procedure(s) Performed: SMALL BOWEL RESECTION (Right: Abdomen) HERNIA REPAIR FEMORAL (Pelvis)  Patient Location: PACU  Anesthesia Type:General  Level of Consciousness: awake, alert , oriented and patient cooperative  Airway & Oxygen Therapy: Patient Spontanous Breathing and Patient connected to face mask oxygen  Post-op Assessment: Report given to RN, Post -op Vital signs reviewed and stable and Patient moving all extremities  Post vital signs: Reviewed and stable  Last Vitals:  Vitals Value Taken Time  BP 129/90 12/01/21 1316  Temp    Pulse 121 12/01/21 1317  Resp 15 12/01/21 1319  SpO2 78 % 12/01/21 1317  Vitals shown include unvalidated device data.  Last Pain:  Vitals:   12/01/21 0946  TempSrc:   PainSc: 4       Patients Stated Pain Goal: 0 (77/41/42 3953)  Complications: No notable events documented.

## 2021-12-02 ENCOUNTER — Encounter (HOSPITAL_COMMUNITY): Payer: Self-pay | Admitting: General Surgery

## 2021-12-02 DIAGNOSIS — D509 Iron deficiency anemia, unspecified: Secondary | ICD-10-CM

## 2021-12-02 DIAGNOSIS — I10 Essential (primary) hypertension: Secondary | ICD-10-CM | POA: Diagnosis not present

## 2021-12-02 DIAGNOSIS — E538 Deficiency of other specified B group vitamins: Secondary | ICD-10-CM

## 2021-12-02 DIAGNOSIS — R627 Adult failure to thrive: Secondary | ICD-10-CM | POA: Diagnosis not present

## 2021-12-02 DIAGNOSIS — R1031 Right lower quadrant pain: Secondary | ICD-10-CM | POA: Diagnosis not present

## 2021-12-02 DIAGNOSIS — K4130 Unilateral femoral hernia, with obstruction, without gangrene, not specified as recurrent: Secondary | ICD-10-CM | POA: Diagnosis not present

## 2021-12-02 LAB — FOLATE: Folate: 5.1 ng/mL — ABNORMAL LOW (ref >5.9)

## 2021-12-02 LAB — CBC
HCT: 30.7 % — ABNORMAL LOW (ref 36.0–46.0)
HCT: 25 % — ABNORMAL LOW (ref 36.0–46.0)
Hemoglobin: 9.9 g/dL — ABNORMAL LOW (ref 12.0–15.0)
Hemoglobin: 8.3 g/dL — ABNORMAL LOW (ref 12.0–15.0)
MCH: 30 pg (ref 26.0–34.0)
MCH: 30.6 pg (ref 26.0–34.0)
MCHC: 32.2 g/dL (ref 30.0–36.0)
MCHC: 33.2 g/dL (ref 30.0–36.0)
MCV: 93 fL (ref 80.0–100.0)
MCV: 92.3 fL (ref 80.0–100.0)
Platelets: 227 10*3/uL (ref 150–400)
Platelets: 170 10*3/uL (ref 150–400)
RBC: 3.3 MIL/uL — ABNORMAL LOW (ref 3.87–5.11)
RBC: 2.71 MIL/uL — ABNORMAL LOW (ref 3.87–5.11)
RDW: 13.6 % (ref 11.5–15.5)
RDW: 13.9 % (ref 11.5–15.5)
WBC: 8 10*3/uL (ref 4.0–10.5)
WBC: 2.5 10*3/uL — ABNORMAL LOW (ref 4.0–10.5)
nRBC: 0 % (ref 0.0–0.2)
nRBC: 0 % (ref 0.0–0.2)

## 2021-12-02 LAB — COMPREHENSIVE METABOLIC PANEL
ALT: 6 U/L (ref 0–44)
AST: 16 U/L (ref 15–41)
Albumin: 2.5 g/dL — ABNORMAL LOW (ref 3.5–5.0)
Alkaline Phosphatase: 31 U/L — ABNORMAL LOW (ref 38–126)
Anion gap: 5 (ref 5–15)
BUN: 20 mg/dL (ref 8–23)
CO2: 23 mmol/L (ref 22–32)
Calcium: 7.6 mg/dL — ABNORMAL LOW (ref 8.9–10.3)
Chloride: 111 mmol/L (ref 98–111)
Creatinine, Ser: 1.09 mg/dL — ABNORMAL HIGH (ref 0.44–1.00)
GFR, Estimated: 51 mL/min — ABNORMAL LOW (ref >60)
Glucose, Bld: 98 mg/dL (ref 70–99)
Potassium: 4.2 mmol/L (ref 3.5–5.1)
Sodium: 139 mmol/L (ref 135–145)
Total Bilirubin: 0.8 mg/dL (ref 0.3–1.2)
Total Protein: 4.7 g/dL — ABNORMAL LOW (ref 6.5–8.1)

## 2021-12-02 LAB — MAGNESIUM: Magnesium: 2 mg/dL (ref 1.7–2.4)

## 2021-12-02 LAB — RETICULOCYTES
Immature Retic Fract: 11.7 % (ref 2.3–15.9)
RBC.: 2.69 MIL/uL — ABNORMAL LOW (ref 3.87–5.11)
Retic Count, Absolute: 18.8 10*3/uL — ABNORMAL LOW (ref 19.0–186.0)
Retic Ct Pct: 0.7 % (ref 0.4–3.1)

## 2021-12-02 LAB — IRON AND TIBC
Iron: 7 ug/dL — ABNORMAL LOW (ref 28–170)
Saturation Ratios: 3 % — ABNORMAL LOW (ref 10.4–31.8)
TIBC: 203 ug/dL — ABNORMAL LOW (ref 250–450)
UIBC: 196 ug/dL

## 2021-12-02 LAB — VITAMIN B12: Vitamin B-12: 74 pg/mL — ABNORMAL LOW (ref 180–914)

## 2021-12-02 LAB — PHOSPHORUS: Phosphorus: 2.3 mg/dL — ABNORMAL LOW (ref 2.5–4.6)

## 2021-12-02 LAB — PREALBUMIN: Prealbumin: 11 mg/dL — ABNORMAL LOW (ref 18–38)

## 2021-12-02 LAB — VITAMIN D 25 HYDROXY (VIT D DEFICIENCY, FRACTURES): Vit D, 25-Hydroxy: 52.65 ng/mL (ref 30–100)

## 2021-12-02 LAB — FERRITIN: Ferritin: 120 ng/mL (ref 11–307)

## 2021-12-02 MED ORDER — CYANOCOBALAMIN 1000 MCG/ML IJ SOLN
1000.0000 ug | Freq: Every day | INTRAMUSCULAR | Status: AC
  Administered 2021-12-02 – 2021-12-05 (×4): 1000 ug via INTRAMUSCULAR
  Filled 2021-12-02 (×4): qty 1

## 2021-12-02 MED ORDER — VITAMIN B-12 1000 MCG PO TABS
1000.0000 ug | ORAL_TABLET | Freq: Every day | ORAL | Status: DC
  Administered 2021-12-06: 1000 ug via ORAL
  Filled 2021-12-02: qty 1

## 2021-12-02 MED ORDER — SODIUM CHLORIDE 0.9 % IV SOLN
250.0000 mg | Freq: Every day | INTRAVENOUS | Status: AC
  Administered 2021-12-02 – 2021-12-03 (×2): 250 mg via INTRAVENOUS
  Filled 2021-12-02 (×2): qty 20

## 2021-12-02 NOTE — Progress Notes (Signed)
PROGRESS NOTE  Robin [name] IRJ:[id number] DOB: Jan 23, 1941   PCP: Chevis Pretty, FNP  Patient is from: Home  DOA: 11/30/2021 LOS: 2  Chief complaints Chief Complaint  Patient presents with   Abdominal Pain     Brief Narrative / Interim history: 81 year old F with PMH of HTN, HLD, osteoporosis and vitamin D deficiency presenting to ED with increased right inguinal pain after picking up a box, and admitted for partial SBO in the setting of right femoral hernia.  General surgery consulted, she underwent primary repair of femoral hernia with McVay repair and resection of the small bowel and primary anastomosis.   Remains n.p.o. with NGT pending return of bowel function per general surgery recommendation.   Subjective: Seen and examined earlier this morning.  No major events overnight of this morning.  No complaints other than some discomfort from NG tube.  No flatus or bowel movement yet.  Objective: Vitals:   12/01/21 1757 12/01/21 2020 12/02/21 0351 12/02/21 1307  BP: 121/66 107/63 (!) 90/58 (!) 95/56  Pulse: 72 79 81 78  Resp: '20 14 16 18  '$ Temp: 99 F (37.2 C) (!) 97.4 F (36.3 C) (!) 97 F (36.1 C) 98.7 F (37.1 C)  TempSrc:    Oral  SpO2: 97% 98% 96% 100%  Weight:      Height:        Examination:  GENERAL: No apparent distress.  Nontoxic. HEENT: MMM.  NGT in place. NECK: Supple.  No apparent JVD.  RESP:  No IWOB.  Fair aeration bilaterally. CVS:  RRR. Heart sounds normal.  ABD/GI/GU: BS+. Abd soft, NTND.  MSK/EXT:  Moves extremities. No apparent deformity. No edema.  SKIN: Honeycomb dressing over right groin area DCI.  Slight erythema distally. NEURO: Awake and alert. Oriented appropriately.  No apparent focal neuro deficit. PSYCH: Calm. Normal affect.   Procedures:  10/11-Primary repair femoral hernia with McVay Repair, resection of small bowel and primary anastomosis   Microbiology summarized: None  Assessment and plan: Principal Problem:    Strangulated femoral hernia Active Problems:   Osteoporosis   Mixed hyperlipidemia   Vitamin D insufficiency   Underweight   Essential hypertension   Abdominal pain   SBO (small bowel obstruction) (HCC)   Hyperkalemia   Hypercalcemia   Renal lesion   Failure to thrive in adult   Vomiting  Strangulated femoral hernia: s/p hernia repair and resection of small bowel with primary anastomosis -n.p.o. with NGT pending return of bowel function per general surgery recommendation. -Continue IV fluid -IV antiemetics and analgesics   AKI: Improved. Recent Labs    12/21/20 1522 11/30/21 1620 11/30/21 1633 12/01/21 0438 12/02/21 0637  BUN 8 34* 31* 25* 20  CREATININE 0.75 1.67* 1.70* 1.20* 1.09*  -Continue IV fluid -Avoid nephrotoxic meds  Hyperkalemia/hypomagnesemia: Resolved. Non-anion gap metabolic acidosis: Resolved.   Iron deficiency anemia/vitamin B12 deficiency: Drop in Hgb likely dilutional.   Recent Labs    12/21/20 1522 11/30/21 1620 11/30/21 1633 12/01/21 0438 12/02/21 0637  HGB 10.8* 12.8 12.9 9.9* 8.3*  -IV ferric gluconate 250 mg daily x2 doses -Vitamin B12 injection followed by p.o. -Monitor H&H  Left renal lesion: CT A/P showed indeterminate 11 mm exophytic lesion arising from the upper left kidney -Renal MRI on a nonemergent basis was recommended   Essential hypertension: Blood pressure within acceptable range. -Continue IV hydralazine 10 mg every 6 hours as needed for SBP > 170   Mixed hyperlipidemia Osteoporosis Vitamin D deficiency -Check vitamin D level -Continue  oral meds when patient resumes oral intake   Failure to thrive in an adult/underweight Body mass index is 16.6 kg/m. -Consult dietitian once she started taking p.o. -Check prealbumin level          DVT prophylaxis:  SCDs Start: 11/30/21 2303  Code Status: Full code Family Communication: None at bedside Level of care: Telemetry Status is: Inpatient Remains inpatient  appropriate because: Strangulated femoral hernia   Final disposition: Home with home health once medically ready. Consultants:  General surgery  Sch Meds:  Scheduled Meds:   Continuous Infusions:  cefoTEtan (CEFOTAN) IV 1 g (12/02/21 0930)   dextrose 5% lactated ringers Stopped (12/01/21 2054)   PRN Meds:.acetaminophen **OR** acetaminophen, hydrALAZINE, morphine injection, ondansetron **OR** ondansetron (ZOFRAN) IV, mouth rinse  Antimicrobials: Anti-infectives (From admission, onward)    Start     Dose/Rate Route Frequency Ordered Stop   12/01/21 2000  cefoTEtan (CEFOTAN) 1 g in sodium chloride 0.9 % 100 mL IVPB        1 g 200 mL/hr over 30 Minutes Intravenous Every 12 hours 12/01/21 1353 12/03/21 0959   12/01/21 1032  ceFAZolin (ANCEF) 2-4 GM/100ML-% IVPB       Note to Pharmacy: Myna Bright: cabinet override      12/01/21 1032 12/01/21 2244        I have personally reviewed the following labs and images: CBC: Recent Labs  Lab 11/30/21 1620 11/30/21 1633 12/01/21 0438 12/02/21 0637  WBC 9.1  --  8.0 2.5*  NEUTROABS 7.7  --   --   --   HGB 12.8 12.9 9.9* 8.3*  HCT 39.5 38.0 30.7* 25.0*  MCV 91.4  --  93.0 92.3  PLT 308  --  227 170   BMP &GFR Recent Labs  Lab 11/30/21 1620 11/30/21 1633 12/01/21 0438 12/02/21 0637  NA 139 135 139 139  K 5.5* 5.4* 4.6 4.2  CL 105 108 112* 111  CO2 23  --  20* 23  GLUCOSE 124* 122* 118* 98  BUN 34* 31* 25* 20  CREATININE 1.67* 1.70* 1.20* 1.09*  CALCIUM 10.4*  --  8.4* 7.6*  MG  --   --  1.4* 2.0  PHOS  --   --  3.0 2.3*   Estimated Creatinine Clearance: 27.6 mL/min (A) (by C-G formula based on SCr of 1.09 mg/dL (H)). Liver & Pancreas: Recent Labs  Lab 11/30/21 1620 12/01/21 0438 12/02/21 0637  AST '21 15 16  '$ ALT '11 9 6  '$ ALKPHOS 57 40 31*  BILITOT 1.2 1.1 0.8  PROT 7.6 5.6* 4.7*  ALBUMIN 4.5 3.3* 2.5*   No results for input(s): "LIPASE", "AMYLASE" in the last 168 hours. No results for input(s):  "AMMONIA" in the last 168 hours. Diabetic: No results for input(s): "HGBA1C" in the last 72 hours. No results for input(s): "GLUCAP" in the last 168 hours. Cardiac Enzymes: No results for input(s): "CKTOTAL", "CKMB", "CKMBINDEX", "TROPONINI" in the last 168 hours. No results for input(s): "PROBNP" in the last 8760 hours. Coagulation Profile: No results for input(s): "INR", "PROTIME" in the last 168 hours. Thyroid Function Tests: No results for input(s): "TSH", "T4TOTAL", "FREET4", "T3FREE", "THYROIDAB" in the last 72 hours. Lipid Profile: No results for input(s): "CHOL", "HDL", "LDLCALC", "TRIG", "CHOLHDL", "LDLDIRECT" in the last 72 hours. Anemia Panel: Recent Labs    12/02/21 0637  VITAMINB12 74*  FOLATE 5.1*  FERRITIN 120  TIBC 203*  IRON 7*  RETICCTPCT 0.7   Urine analysis: No results found for: "COLORURINE", "  APPEARANCEUR", "LABSPEC", "PHURINE", "GLUCOSEU", "HGBUR", "BILIRUBINUR", "KETONESUR", "PROTEINUR", "UROBILINOGEN", "NITRITE", "LEUKOCYTESUR" Sepsis Labs: Invalid input(s): "PROCALCITONIN", "LACTICIDVEN"  Microbiology: No results found for this or any previous visit (from the past 240 hour(s)).  Radiology Studies: No results found.    Somaya Grassi T. [place]  If 7PM-7AM, please contact night-coverage [email or web address] 12/02/2021, 2:59 PM

## 2021-12-02 NOTE — TOC Initial Note (Signed)
Transition of Care [facility]) - Initial/Assessment Note    Patient Details  Name: Robin [name] MRN: [id number] Date of Birth: [birth year]  [facility] CM/SW Contact:    Boneta Lucks, RN Phone Number: 12/02/2021, 12:43 PM  Clinical Narrative:   Patient admitted with strangulated femoral hernia. PT is recommending home health PT. Patient is agreeable. No preferences. Marjory Lies with [facility] accepted the referral. MD aware to order.   Expected Discharge Plan: [place] Barriers to Discharge: Continued Medical Work up     Expected Discharge Plan and Services Expected Discharge Plan: [place]                    DME Agency: [facility] [place] Arranged: PT       Prior Living Arrangements/Services     Patient language and need for interpreter reviewed:: Yes        Need for Family Participation in Patient Care: Yes (Comment) Care giver support system in place?: Yes (comment)   Criminal Activity/Legal Involvement Pertinent to Current Situation/Hospitalization: No - Comment as needed  Activities of Daily Living Home Assistive Devices/Equipment: Shower chair without back ADL Screening (condition at time of admission) Patient's cognitive ability adequate to safely complete daily activities?: Yes Is the patient deaf or have difficulty hearing?: Yes Does the patient have difficulty seeing, even when wearing glasses/contacts?: No Does the patient have difficulty concentrating, remembering, or making decisions?: No Patient able to express need for assistance with ADLs?: Yes Does the patient have difficulty dressing or bathing?: No Independently performs ADLs?: Yes (appropriate for developmental age) Does the patient have difficulty walking or climbing stairs?: No Weakness of Legs: None Weakness of Arms/Hands: None     Emotional Assessment   Attitude/Demeanor/Rapport: Engaged Affect (typically observed):  Accepting Orientation: : Oriented to Self, Oriented to Place, Oriented to  Time, Oriented to Situation Alcohol / Substance Use: Not Applicable Psych Involvement: No (comment)  Admission diagnosis:  Hyperkalemia [E87.5] Acute kidney injury ([place]) [N17.9] Incarcerated hernia [K46.0] Incarcerated right inguinal hernia [K40.30] Patient Active Problem List   Diagnosis Date Noted   Strangulated femoral hernia 12/01/2021   Abdominal pain 11/30/2021   SBO (small bowel obstruction) ([place]) 11/30/2021   Hyperkalemia 11/30/2021   Hypercalcemia 11/30/2021   Renal lesion 11/30/2021   Failure to thrive in adult 11/30/2021   Vomiting 11/30/2021   GAD (generalized anxiety disorder) 06/21/2018   Essential hypertension 10/23/2014   History of colonic polyps 06/27/2014   Underweight 05/30/2014   Vitamin D insufficiency 07/31/2013   Osteoporosis 10/31/2012   Mixed hyperlipidemia 10/31/2012   PCP:  Chevis Pretty, FNP Pharmacy:   [facility] [address] [place] [address] [place] [address] Phone: [phone] Fax: [phone]   Readmission Risk Interventions    12/02/2021   12:17 PM  Readmission Risk Prevention Plan  Medication Screening Complete  Transportation Screening Complete

## 2021-12-02 NOTE — Evaluation (Signed)
Physical Therapy Evaluation Patient Details Name: Robin [name] MRN: [id number] DOB: [birth year] Today's Date: 12/02/2021  History of Present Illness  Robin [name] is a 81 y.o. female with medical history significant of hypertension, osteoporosis, vitamin D deficiency, hyperlipidemia who presents to the emergency department due to worsening pain at the right inguinal hernia.  Patient states that she picked up a box yesterday in the evening and sensed pain at the right inguinal area.  She continued to have pain in the same area today and this is associated with vomiting at the hernia site which appears larger and more firm than baseline, so she decided to go to the ED for further evaluation and management.   Clinical Impression  Patient able to transfer from bed to chair Mod I with no AD. Patient unsteady on feet after a few steps w/o AD, switched to RW with patient demonstrating improved balance, cadence and safety for ambulation. Patient tolerated sitting up in chair after therapy. Patient will benefit from continued skilled physical therapy in hospital and recommended venue below to increase strength, balance, endurance for safe ADLs and gait.     Recommendations for follow up therapy are one component of a multi-disciplinary discharge planning process, led by the attending physician.  Recommendations may be updated based on patient status, additional functional criteria and insurance authorization.  Follow Up Recommendations Home health PT      Assistance Recommended at Discharge Set up Supervision/Assistance  Patient can return home with the following       Equipment Recommendations None recommended by PT  Recommendations for Other Services       Functional Status Assessment Patient has had a recent decline in their functional status and demonstrates the ability to make significant improvements in function in a reasonable and predictable amount of time.     Precautions /  Restrictions Precautions Precautions: Fall Restrictions Weight Bearing Restrictions: No      Mobility  Bed Mobility                    Transfers                        Ambulation/Gait                  Stairs            Wheelchair Mobility    Modified Rankin (Stroke Patients Only)       Balance Overall balance assessment: Needs assistance Sitting-balance support: No upper extremity supported, Feet supported Sitting balance-Leahy Scale: Good Sitting balance - Comments: seated EOB   Standing balance support: Bilateral upper extremity supported, During functional activity Standing balance-Leahy Scale: Good Standing balance comment: fair without RW, good with RW                             Pertinent Vitals/Pain Pain Assessment Pain Assessment: Faces Faces Pain Scale: Hurts a little bit Pain Location: throat (pt reports due to NGT placement) Pain Descriptors / Indicators: Sore Pain Intervention(s): Limited activity within patient's tolerance, Monitored during session, Repositioned    Home Living Family/patient expects to be discharged to:: Private residence Living Arrangements: Children (lives with son) Available Help at Discharge: Family;Neighbor;Available 24 hours/day Type of Home: Apartment Home Access: Elevator       Home Layout: One level Home Equipment: Rollator (4 wheels);Hand held shower head;Shower seat;Grab bars - tub/shower Additional Comments: patient reports  she is getting a rollator once she gets out of the hospital    Prior Function Prior Level of Function : Independent/Modified Independent             Mobility Comments: 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, drives ADLs Comments: independent, son can assist with iADILs     Hand Dominance        Extremity/Trunk Assessment   Upper Extremity Assessment Upper Extremity Assessment: Defer to OT evaluation    Lower Extremity Assessment Lower  Extremity Assessment: Generalized weakness    Cervical / Trunk Assessment Cervical / Trunk Assessment: Normal  Communication   Communication: No difficulties  Cognition Arousal/Alertness: Awake/alert Behavior During Therapy: WFL for tasks assessed/performed Overall Cognitive Status: Within Functional Limits for tasks assessed                                          General Comments      Exercises     Assessment/Plan    PT Assessment Patient needs continued PT services  PT Problem List Decreased strength;Decreased activity tolerance;Decreased balance;Decreased mobility       PT Treatment Interventions DME instruction;Balance training;Gait training;Stair training;Patient/family education;Functional mobility training;Therapeutic activities;Therapeutic exercise    PT Goals (Current goals can be found in the Care Plan section)  Acute Rehab PT Goals Patient Stated Goal: return home with family to assist PT Goal Formulation: With patient Time For Goal Achievement: 12/09/21 Potential to Achieve Goals: Good    Frequency Min 3X/week     Co-evaluation               AM-PAC PT "6 Clicks" Mobility  Outcome Measure Help needed turning from your back to your side while in a flat bed without using bedrails?: None Help needed moving from lying on your back to sitting on the side of a flat bed without using bedrails?: None Help needed moving to and from a bed to a chair (including a wheelchair)?: A Little Help needed standing up from a chair using your arms (e.g., wheelchair or bedside chair)?: A Little Help needed to walk in hospital room?: A Little Help needed climbing 3-5 steps with a railing? : A Little 6 Click Score: 20    End of Session   Activity Tolerance: Patient tolerated treatment well;Patient limited by fatigue Patient left: in chair;with call bell/phone within reach Nurse Communication: Mobility status PT Visit Diagnosis: Unsteadiness on feet  (R26.81);Other abnormalities of gait and mobility (R26.89);Muscle weakness (generalized) (M62.81)    Time: 9983-3825 PT Time Calculation (min) (ACUTE ONLY): 21 min   Charges:   PT Evaluation $PT Eval Moderate Complexity: 1 Mod PT Treatments $Therapeutic Activity: 8-22 mins        Zigmund Gottron, SPT

## 2021-12-02 NOTE — Progress Notes (Signed)
Patient has been stable.  Patient has c/o pain x 1.  Pain has only had approximately 100cc of output from Ng tube.  Patient has gotten up to [facility] with assistance.  Vitals have been stable.

## 2021-12-02 NOTE — Progress Notes (Signed)
[facility]  Patient with NG. No flatus or Bms.   BP (!) 95/56 (BP Location: Right Arm)   Pulse 78   Temp 98.7 F (37.1 C) (Oral)   Resp 18   Ht '5\' 3"'$  (1.6 m)   Wt 42.5 kg   SpO2 100%   BMI 16.60 kg/m  Bruising right thigh and incision with honeycomb and staples, no erythema or drainage  Patient s/p R femoral hernia repair with McVay, SBR doing well. Awaiting bowel function.  NG in place  NPO Ice is ok Monitoring bruising    Curlene Labrum, MD [facility] [address] [place], [place] [address] [phone] (office)

## 2021-12-02 NOTE — Plan of Care (Signed)
  Problem: Acute Rehab PT Goals(only PT should resolve) Goal: Pt Will Go Supine/Side To Sit Outcome: Progressing Flowsheets (Taken 12/02/2021 1215) Pt will go Supine/Side to Sit: Independently Goal: Patient Will Transfer Sit To/From Stand Outcome: Progressing Flowsheets (Taken 12/02/2021 1215) Patient will transfer sit to/from stand: Independently Goal: Pt Will Transfer Bed To Chair/Chair To Bed Outcome: Progressing Flowsheets (Taken 12/02/2021 1215) Pt will Transfer Bed to Chair/Chair to Bed: with modified independence Goal: Pt Will Ambulate Outcome: Progressing Flowsheets (Taken 12/02/2021 1215) Pt will Ambulate:  with modified independence  > 125 feet  with least restrictive assistive device   [employer], SPT

## 2021-12-03 LAB — CBC
HCT: 28.9 % — ABNORMAL LOW (ref 36.0–46.0)
Hemoglobin: 9.3 g/dL — ABNORMAL LOW (ref 12.0–15.0)
MCH: 30.1 pg (ref 26.0–34.0)
MCHC: 32.2 g/dL (ref 30.0–36.0)
MCV: 93.5 fL (ref 80.0–100.0)
Platelets: 206 10*3/uL (ref 150–400)
RBC: 3.09 MIL/uL — ABNORMAL LOW (ref 3.87–5.11)
RDW: 14.2 % (ref 11.5–15.5)
WBC: 8.2 10*3/uL (ref 4.0–10.5)
nRBC: 0 % (ref 0.0–0.2)

## 2021-12-03 LAB — RENAL FUNCTION PANEL
Albumin: 2.1 g/dL — ABNORMAL LOW (ref 3.5–5.0)
Anion gap: 7 (ref 5–15)
BUN: 31 mg/dL — ABNORMAL HIGH (ref 8–23)
CO2: 21 mmol/L — ABNORMAL LOW (ref 22–32)
Calcium: 7.3 mg/dL — ABNORMAL LOW (ref 8.9–10.3)
Chloride: 113 mmol/L — ABNORMAL HIGH (ref 98–111)
Creatinine, Ser: 1.59 mg/dL — ABNORMAL HIGH (ref 0.44–1.00)
GFR, Estimated: 33 mL/min — ABNORMAL LOW (ref >60)
Glucose, Bld: 110 mg/dL — ABNORMAL HIGH (ref 70–99)
Phosphorus: 3.4 mg/dL (ref 2.5–4.6)
Potassium: 4.3 mmol/L (ref 3.5–5.1)
Sodium: 141 mmol/L (ref 135–145)

## 2021-12-03 LAB — MAGNESIUM: Magnesium: 2.1 mg/dL (ref 1.7–2.4)

## 2021-12-03 LAB — SURGICAL PATHOLOGY

## 2021-12-03 MED ORDER — LACTATED RINGERS IV SOLN
INTRAVENOUS | Status: AC
  Administered 2021-12-03: 100 mL/h via INTRAVENOUS

## 2021-12-03 MED ORDER — ESCITALOPRAM OXALATE 10 MG PO TABS
10.0000 mg | ORAL_TABLET | Freq: Every day | ORAL | Status: DC
  Administered 2021-12-03 – 2021-12-06 (×4): 10 mg via ORAL
  Filled 2021-12-03 (×4): qty 1

## 2021-12-03 MED ORDER — BOOST / RESOURCE BREEZE PO LIQD CUSTOM
1.0000 | Freq: Three times a day (TID) | ORAL | Status: DC
  Administered 2021-12-04 – 2021-12-06 (×7): 1 via ORAL

## 2021-12-03 MED ORDER — ASPIRIN 81 MG PO TBEC
81.0000 mg | DELAYED_RELEASE_TABLET | Freq: Every day | ORAL | Status: DC
  Administered 2021-12-03 – 2021-12-04 (×2): 81 mg via ORAL
  Filled 2021-12-03 (×3): qty 1

## 2021-12-03 MED ORDER — ATORVASTATIN CALCIUM 40 MG PO TABS
80.0000 mg | ORAL_TABLET | Freq: Every day | ORAL | Status: DC
  Administered 2021-12-03 – 2021-12-06 (×4): 80 mg via ORAL
  Filled 2021-12-03 (×4): qty 2

## 2021-12-03 NOTE — Progress Notes (Signed)
Initial Nutrition Assessment  DOCUMENTATION CODES:      INTERVENTION:  Boost Breeze po TID   Advance diet to regular  NUTRITION DIAGNOSIS:   Inadequate oral intake related to acute illness as evidenced by per patient/family report, meal completion < 25%, mild muscle depletion, mild fat depletion (clear liquid diet).   GOAL:  Patient will meet greater than or equal to 90% of their needs  MONITOR:     REASON FOR ASSESSMENT:   Consult Assessment of nutrition requirement/status  ASSESSMENT: Patient is a 81 yo female with hx of vitamin D deficiency, HTN and HLD. Presents with abdominal pain.   10/11- status post right femoral hernia repair.  Clear liquids currently. Patient affirms good appetite PTA and eats 3 meals daily. She denies chewing or swallow problem. Independent with feeding. Ambulates with a walker. Mild muscle depletions - at risk for malnutrition, underweight.   Patient expecting to be discharged over the weekend.  Weights stable between 44-45 kg the past 1.7 years.   Medications: B-12, lipitor  IVF-LR@ 100 ml/hr     Latest Ref Rng & Units 12/03/2021    4:38 AM 12/02/2021    6:37 AM 12/01/2021    4:38 AM  BMP  Glucose 70 - 99 mg/dL 110  98  118   BUN 8 - 23 mg/dL '31  20  25   '$ Creatinine 0.44 - 1.00 mg/dL 1.59  1.09  1.20   Sodium 135 - 145 mmol/L 141  139  139   Potassium 3.5 - 5.1 mmol/L 4.3  4.2  4.6   Chloride 98 - 111 mmol/L 113  111  112   CO2 22 - 32 mmol/L '21  23  20   '$ Calcium 8.9 - 10.3 mg/dL 7.3  7.6  8.4       NUTRITION - FOCUSED PHYSICAL EXAM:  Flowsheet Row Most Recent Value  Orbital Region No depletion  Upper Arm Region Mild depletion  Buccal Region Mild depletion  Temple Region No depletion  Clavicle Bone Region Mild depletion  Clavicle and Acromion Bone Region Mild depletion  Dorsal Hand No depletion  Patellar Region Mild depletion  Anterior Thigh Region Mild depletion  Edema (RD Assessment) None  Hair Reviewed  Eyes  Reviewed  Mouth Reviewed  Skin Reviewed  Nails Reviewed      Diet Order:   Diet Order             Diet clear liquid Room service appropriate? Yes; Fluid consistency: Thin  Diet effective now                   EDUCATION NEEDS:  Education needs have been addressed  Skin:  Skin Assessment: Skin Integrity Issues: Skin Integrity Issues:: Incisions Incisions: perineum  Last BM:  10/12 diarrhea per nursing  Height:   Ht Readings from Last 1 Encounters:  11/30/21 '5\' 3"'$  (1.6 m)    Weight:   Wt Readings from Last 1 Encounters:  11/30/21 42.5 kg    Ideal Body Weight:   52 kg  BMI:  Body mass index is 16.6 kg/m.  Estimated Nutritional Needs:   Kcal:  1500-1700  Protein:  65-70 gr  Fluid:  1500 ml daily   Colman Cater MS,RD,CSG,LDN Contact: Shea Evans

## 2021-12-03 NOTE — Discharge Instructions (Addendum)
Discharge Instructions Hernia:  Common Complaints: Pain at the incision site is common. This will improve with time. Take your pain medications as described below. Some nausea is common and poor appetite. The main goal is to stay hydrated the first few days after surgery.  Numbness at the incision or the thigh is common.  If you start to have burning or tingling pain in your groin, this is from a nerve being pinched. Please call and we can prescribe you a different type of pain medication for nerve pain.  Bruising in the side of the repair and down into the scrotum on that side is normal, if the bruising is spreading, call the office.  Ice the side for the first 72 hours.   Diet/ Activity: Diet as tolerated. You may not have an appetite, but it is important to stay hydrated. Drink 64 ounces of water a day. Your appetite will return with time.  Shower per your regular routine daily.  Do not take hot showers. Take warm showers that are less than 10 minutes. Can remove Honeycomb dressing after discharge if not removed at that time.  Rest and listen to your body, but do not remain in bed all day.  Walk everyday for at least 15-20 minutes. Deep cough and move around every 1-2 hours in the first few days after surgery.  Do not place lotions or balms on your incision unless instructed to specifically by Dr. Constance Haw.  Do not lift > 10 lbs, perform excessive bending, pushing, pulling, squatting for 6-8 weeks after surgery.   Pain Expectations and Narcotics: -After surgery you will have pain associated with your incisions and this is normal. The pain is muscular and nerve pain, and will get better with time. -You are encouraged and expected to take non narcotic medications like tylenol and ibuprofen (when able) to treat pain as multiple modalities can aid with pain treatment. -Narcotics are only used when pain is severe or there is breakthrough pain. -You are not expected to have a pain score of 0 after  surgery, as we cannot prevent pain. A pain score of 3-4 that allows you to be functional, move, walk, and tolerate some activity is the goal. The pain will continue to improve over the days after surgery and is dependent on your surgery. -Due to [place] law, we are only able to give a certain amount of pain medication to treat post operative pain, and we only give additional narcotics on a patient by patient basis.  -For most laparoscopic surgery, studies have shown that the majority of patients only need 10-15 narcotic pills, and for open surgeries most patients only need 15-20.   -Having appropriate expectations of pain and knowledge of pain management with non narcotics is important as we do not want anyone to become addicted to narcotic pain medication.  -Using ice packs in the first 48 hours and heating pads after 48 hours, wearing an abdominal binder (when recommended), and using over the counter medications are all ways to help with pain management.   -Simple acts like meditation and mindfulness practices after surgery can also help with pain control and research has proven the benefit of these practices.  Medication: Take tylenol and ibuprofen as needed for pain control, alternating every 4-6 hours.  Example:  Tylenol '1000mg'$  @ 6am, 12noon, 6pm, 23mdnight (Do not exceed '4000mg'$  of tylenol a day). Ibuprofen '800mg'$  @ 9am, 3pm, 9pm, 3am (Do not exceed '3600mg'$  of ibuprofen a day).  Take Roxicodone for breakthrough pain  every 4 hours.  Take Colace for constipation related to narcotic pain medication. If you do not have a bowel movement in 2 days, take Miralax over the counter.  Drink plenty of water to also prevent constipation.   Contact Information: If you have questions or concerns, please call our office, [phone], Monday- Thursday 8AM-5PM and Friday 8AM-12Noon.  If it is after hours or on the weekend, please call [facility] Main Number, [phone], [phone], and ask to speak to the surgeon on  call for Dr. Constance Haw at [facility].

## 2021-12-03 NOTE — Care Management Important Message (Signed)
Important Message  Patient Details  Name: Robin [name] MRN: [id number] Date of Birth: 05-22-1940   Medicare Important Message Given:  Yes     Tommy Medal 12/03/2021, 12:47 PM

## 2021-12-03 NOTE — Progress Notes (Signed)
PROGRESS NOTE  TOPANGA ALVELO TKW:[id number] DOB: 1940-06-11   PCP: Chevis Pretty, FNP  Patient is from: Home  DOA: 11/30/2021 LOS: 3  Chief complaints Chief Complaint  Patient presents with   Abdominal Pain     Brief Narrative / Interim history: 81 year old F with PMH of HTN, HLD, osteoporosis and vitamin D deficiency presenting to ED with increased right inguinal pain after picking up a box, and admitted for partial SBO in the setting of right femoral hernia.    General surgery consulted, she underwent primary repair of femoral hernia with McVay repair and resection of the small bowel and primary anastomosis on 10/11.  NG tube discontinued on 10/13.  Started on clear liquid diet.  General surgery following  Subjective: Seen and examined earlier this morning.  No major events overnight of this morning.  She had bowel movements.  NG tube discontinued and she is started on clear liquid diet.  Feels sore at surgical site.  No other complaints.  Objective: Vitals:   12/02/21 1307 12/02/21 2034 12/03/21 0457 12/03/21 1234  BP: (!) 95/56 (!) 95/56 (!) 89/47 (!) 91/53  Pulse: 78 (!) 54 71 79  Resp: '18 20 18 18  '$ Temp: 98.7 F (37.1 C) 97.6 F (36.4 C) 98.1 F (36.7 C) 97.6 F (36.4 C)  TempSrc: Oral   Oral  SpO2: 100% 95% 96% 96%  Weight:      Height:        Examination:  GENERAL: No apparent distress.  Nontoxic. HEENT: MMM.  Vision and hearing grossly intact.  NECK: Supple.  No apparent JVD.  RESP:  No IWOB.  Fair aeration bilaterally. CVS:  RRR. Heart sounds normal.  ABD/GI/GU: BS+. Abd soft, NTND.  MSK/EXT:  Moves extremities. No apparent deformity. No edema.  SKIN: Honeycomb dressing over surgical site.  Some erythema but improved NEURO: Awake and alert. Oriented appropriately.  No apparent focal neuro deficit. PSYCH: Calm. Normal affect.   Procedures:  10/11-Primary repair femoral hernia with McVay Repair, resection of small bowel and primary anastomosis    Microbiology summarized: None  Assessment and plan: Principal Problem:   Strangulated femoral hernia Active Problems:   Osteoporosis   Mixed hyperlipidemia   Vitamin D insufficiency   Underweight   Essential hypertension   Abdominal pain   SBO (small bowel obstruction) (HCC)   Hyperkalemia   Hypercalcemia   Renal lesion   Failure to thrive in adult   Vomiting  Strangulated femoral hernia: s/p hernia repair and resection of small bowel with primary anastomosis on 10/11.  Having bowel movements. -NG tube discontinued.  Started clear liquid diet. -Continue IV fluid -General surgery following.   AKI: Creatinine up to 1.6 after initial improvement.  Getting the IV fluid fell off Recent Labs    12/21/20 1522 11/30/21 1620 11/30/21 1633 12/01/21 0438 12/02/21 0637 12/03/21 0438  BUN 8 34* 31* 25* 20 31*  CREATININE 0.75 1.67* 1.70* 1.20* 1.09* 1.59*  -Resume IV fluid. -Avoid nephrotoxic meds -Recheck renal function in the morning  Hyperkalemia/hypomagnesemia: Resolved. Non-anion gap metabolic acidosis: Resolved.   Iron deficiency anemia/vitamin B12 deficiency: Drop in Hgb likely dilutional.   Recent Labs    12/21/20 1522 11/30/21 1620 11/30/21 1633 12/01/21 0438 12/02/21 0637 12/03/21 0438  HGB 10.8* 12.8 12.9 9.9* 8.3* 9.3*  -IV ferric gluconate 250 mg daily x2 doses -Vitamin B12 injection followed by p.o. -Monitor H&H  Left renal lesion: CT A/P showed indeterminate 11 mm exophytic lesion arising from the upper left kidney -  Renal MRI on a nonemergent basis was recommended   Essential hypertension: Blood pressure within acceptable range. -Continue IV hydralazine 10 mg every 6 hours as needed for SBP > 170   Mixed hyperlipidemia Osteoporosis-on Prolia injection outpatient. History of Vitamin D deficiency-vitamin D level normal.   Failure to thrive in an adult/underweight: Prealbumin low at 11. Body mass index is 16.6 kg/m. -Consult dietitian           DVT prophylaxis:  SCDs Start: 11/30/21 2303  Code Status: Full code Family Communication: None at bedside Level of care: Telemetry Status is: Inpatient Remains inpatient appropriate because: Strangulated femoral hernia   Final disposition: Home with home health once medically ready. Consultants:  General surgery  Sch Meds:  Scheduled Meds:  aspirin EC  81 mg Oral Daily   atorvastatin  80 mg Oral Daily   cyanocobalamin  1,000 mcg Intramuscular Daily   Followed by   Derrill Memo ON 12/06/2021] vitamin B-12  1,000 mcg Oral Daily   escitalopram  10 mg Oral Daily    Continuous Infusions:  lactated ringers 100 mL/hr at 12/03/21 1400   PRN Meds:.acetaminophen **OR** acetaminophen, hydrALAZINE, morphine injection, ondansetron **OR** ondansetron (ZOFRAN) IV, mouth rinse  Antimicrobials: Anti-infectives (From admission, onward)    Start     Dose/Rate Route Frequency Ordered Stop   12/01/21 2000  cefoTEtan (CEFOTAN) 1 g in sodium chloride 0.9 % 100 mL IVPB        1 g 200 mL/hr over 30 Minutes Intravenous Every 12 hours 12/01/21 1353 12/02/21 2319   12/01/21 1032  ceFAZolin (ANCEF) 2-4 GM/100ML-% IVPB       Note to Pharmacy: Myna Bright: cabinet override      12/01/21 1032 12/01/21 2244        I have personally reviewed the following labs and images: CBC: Recent Labs  Lab 11/30/21 1620 11/30/21 1633 12/01/21 0438 12/02/21 0637 12/03/21 0438  WBC 9.1  --  8.0 2.5* 8.2  NEUTROABS 7.7  --   --   --   --   HGB 12.8 12.9 9.9* 8.3* 9.3*  HCT 39.5 38.0 30.7* 25.0* 28.9*  MCV 91.4  --  93.0 92.3 93.5  PLT 308  --  227 170 206   BMP &GFR Recent Labs  Lab 11/30/21 1620 11/30/21 1633 12/01/21 0438 12/02/21 0637 12/03/21 0438  NA 139 135 139 139 141  K 5.5* 5.4* 4.6 4.2 4.3  CL 105 108 112* 111 113*  CO2 23  --  20* 23 21*  GLUCOSE 124* 122* 118* 98 110*  BUN 34* 31* 25* 20 31*  CREATININE 1.67* 1.70* 1.20* 1.09* 1.59*  CALCIUM 10.4*  --  8.4* 7.6* 7.3*  MG   --   --  1.4* 2.0 2.1  PHOS  --   --  3.0 2.3* 3.4   Estimated Creatinine Clearance: 18.9 mL/min (A) (by C-G formula based on SCr of 1.59 mg/dL (H)). Liver & Pancreas: Recent Labs  Lab 11/30/21 1620 12/01/21 0438 12/02/21 0637 12/03/21 0438  AST '21 15 16  '$ --   ALT '11 9 6  '$ --   ALKPHOS 57 40 31*  --   BILITOT 1.2 1.1 0.8  --   PROT 7.6 5.6* 4.7*  --   ALBUMIN 4.5 3.3* 2.5* 2.1*   No results for input(s): "LIPASE", "AMYLASE" in the last 168 hours. No results for input(s): "AMMONIA" in the last 168 hours. Diabetic: No results for input(s): "HGBA1C" in the last 72 hours. No results  for input(s): "GLUCAP" in the last 168 hours. Cardiac Enzymes: No results for input(s): "CKTOTAL", "CKMB", "CKMBINDEX", "TROPONINI" in the last 168 hours. No results for input(s): "PROBNP" in the last 8760 hours. Coagulation Profile: No results for input(s): "INR", "PROTIME" in the last 168 hours. Thyroid Function Tests: No results for input(s): "TSH", "T4TOTAL", "FREET4", "T3FREE", "THYROIDAB" in the last 72 hours. Lipid Profile: No results for input(s): "CHOL", "HDL", "LDLCALC", "TRIG", "CHOLHDL", "LDLDIRECT" in the last 72 hours. Anemia Panel: Recent Labs    12/02/21 0637  VITAMINB12 74*  FOLATE 5.1*  FERRITIN 120  TIBC 203*  IRON 7*  RETICCTPCT 0.7   Urine analysis: No results found for: "COLORURINE", "APPEARANCEUR", "LABSPEC", "PHURINE", "GLUCOSEU", "HGBUR", "BILIRUBINUR", "KETONESUR", "PROTEINUR", "UROBILINOGEN", "NITRITE", "LEUKOCYTESUR" Sepsis Labs: Invalid input(s): "PROCALCITONIN", "LACTICIDVEN"  Microbiology: No results found for this or any previous visit (from the past 240 hour(s)).  Radiology Studies: No results found.    Johnay Mano T. [place]  If 7PM-7AM, please contact night-coverage [email or web address] 12/03/2021, 2:38 PM

## 2021-12-03 NOTE — Progress Notes (Signed)
Physical Therapy Treatment Patient Details Name: Robin [name] MRN: [id number] DOB: Dec 30, 1940 Today's Date: 12/03/2021   History of Present Illness Robin [name] is a 81 y.o. female with medical history significant of hypertension, osteoporosis, vitamin D deficiency, hyperlipidemia who presents to the emergency department due to worsening pain at the right inguinal hernia.  Patient states that she picked up a box yesterday in the evening and sensed pain at the right inguinal area.  She continued to have pain in the same area today and this is associated with vomiting at the hernia site which appears larger and more firm than baseline, so she decided to go to the ED for further evaluation and management.    PT Comments    Pt friendly and willing to participate.  Therapist entered room as NT was changing bed linens, reports no assistance with bed mobility and SBA with transfer to chair.  Pt able to demonstrate safe mechanics with STS from chair.  Utilized RW for stability upon standing.  Pt able to increased distance with gait training, no LOB episodes just slow cadence with SBA for safety.  EOS pt left in chair with call bell within reach.  Pt did report some abdominal pain upon standing/movements, was monitored through session.      Recommendations for follow up therapy are one component of a multi-disciplinary discharge planning process, led by the attending physician.  Recommendations may be updated based on patient status, additional functional criteria and insurance authorization.  Follow Up Recommendations  Home health PT     Assistance Recommended at Discharge Set up Supervision/Assistance  Patient can return home with the following     Equipment Recommendations  None recommended by PT    Recommendations for Other Services       Precautions / Restrictions Precautions Precautions: Fall Restrictions Weight Bearing Restrictions: No     Mobility  Bed Mobility                General bed mobility comments: NT assisted from bed to chair, reports independent bed mobilty    Transfers Overall transfer level: Modified independent Equipment used: Rolling walker (2 wheels)               General transfer comment: able to demonstrate safe mechanics with no assistance required, used RW for stability upon standing    Ambulation/Gait Ambulation/Gait assistance: Supervision Gait Distance (Feet): 150 Feet Assistive device: Rolling walker (2 wheels) Gait Pattern/deviations: Decreased step length - right, Decreased step length - left, Decreased stride length Gait velocity: decreased     General Gait Details: pt stable gait mechanics with RW, slow cadence, no LOB episodes through session   Stairs             Wheelchair Mobility    Modified Rankin (Stroke Patients Only)       Balance                                            Cognition Arousal/Alertness: Awake/alert Behavior During Therapy: WFL for tasks assessed/performed Overall Cognitive Status: Within Functional Limits for tasks assessed                                          Exercises      General Comments  Pertinent Vitals/Pain Pain Assessment Pain Score: 7  Pain Location: abdominal pain with movements Pain Descriptors / Indicators: Discomfort Pain Intervention(s): Monitored during session, Repositioned, Heat applied (Given warm blanket at EOS for comfort)    Home Living                          Prior Function            PT Goals (current goals can now be found in the care plan section)      Frequency           PT Plan Current plan remains appropriate    Co-evaluation              AM-PAC PT "6 Clicks" Mobility   Outcome Measure  Help needed turning from your back to your side while in a flat bed without using bedrails?: None Help needed moving from lying on your back to sitting on the side of  a flat bed without using bedrails?: None Help needed moving to and from a bed to a chair (including a wheelchair)?: None Help needed standing up from a chair using your arms (e.g., wheelchair or bedside chair)?: None Help needed to walk in hospital room?: None Help needed climbing 3-5 steps with a railing? : A Little 6 Click Score: 23    End of Session Equipment Utilized During Treatment: Gait belt Activity Tolerance: Patient tolerated treatment well;Patient limited by fatigue Patient left: in chair;with call bell/phone within reach Nurse Communication: Mobility status PT Visit Diagnosis: Unsteadiness on feet (R26.81);Other abnormalities of gait and mobility (R26.89);Muscle weakness (generalized) (M62.81)     Time: 9417-4081 PT Time Calculation (min) (ACUTE ONLY): 25 min  Charges:  $Therapeutic Activity: 23-37 mins          Ihor Austin, LPTA/CLT; CBIS [phone]   Aldona Lento 12/03/2021, 8:57 AM

## 2021-12-03 NOTE — Progress Notes (Signed)
[facility]  Doing well. Had BM. Started clears.   BP (!) 91/53 (BP Location: Right Arm)   Pulse 79   Temp 97.6 F (36.4 C) (Oral)   Resp 18   Ht '5\' 3"'$  (1.6 m)   Wt 42.5 kg   SpO2 96%   BMI 16.60 kg/m  Bruising on upper thigh and labia on the right side. No erythema or drainage. Incision looks good with staples, honeycomb in place  Patient s/p R femoral hernia repair with McVay, SBR doing well. Clear diet adv as tolerated PRN for pain Monitor bruising in thigh and labia no signs of infection  Home over weekend likely. Staples out 10/26, I have notified the office Dr. Graciella Freer, DO seeing over weekend.  Curlene Labrum, MD [facility] [address] [place], [place] [address] [phone] (office)

## 2021-12-04 LAB — COMPREHENSIVE METABOLIC PANEL
ALT: 8 U/L (ref 0–44)
AST: 17 U/L (ref 15–41)
Albumin: 2.1 g/dL — ABNORMAL LOW (ref 3.5–5.0)
Alkaline Phosphatase: 36 U/L — ABNORMAL LOW (ref 38–126)
Anion gap: 5 (ref 5–15)
BUN: 25 mg/dL — ABNORMAL HIGH (ref 8–23)
CO2: 24 mmol/L (ref 22–32)
Calcium: 7.3 mg/dL — ABNORMAL LOW (ref 8.9–10.3)
Chloride: 107 mmol/L (ref 98–111)
Creatinine, Ser: 1.23 mg/dL — ABNORMAL HIGH (ref 0.44–1.00)
GFR, Estimated: 44 mL/min — ABNORMAL LOW (ref >60)
Glucose, Bld: 102 mg/dL — ABNORMAL HIGH (ref 70–99)
Potassium: 3.6 mmol/L (ref 3.5–5.1)
Sodium: 136 mmol/L (ref 135–145)
Total Bilirubin: 0.7 mg/dL (ref 0.3–1.2)
Total Protein: 4.5 g/dL — ABNORMAL LOW (ref 6.5–8.1)

## 2021-12-04 LAB — PHOSPHORUS: Phosphorus: <1 mg/dL — CL (ref 2.5–4.6)

## 2021-12-04 LAB — CBC
HCT: 24.6 % — ABNORMAL LOW (ref 36.0–46.0)
Hemoglobin: 7.8 g/dL — ABNORMAL LOW (ref 12.0–15.0)
MCH: 29.5 pg (ref 26.0–34.0)
MCHC: 31.7 g/dL (ref 30.0–36.0)
MCV: 93.2 fL (ref 80.0–100.0)
Platelets: 191 10*3/uL (ref 150–400)
RBC: 2.64 MIL/uL — ABNORMAL LOW (ref 3.87–5.11)
RDW: 14.2 % (ref 11.5–15.5)
WBC: 7.5 10*3/uL (ref 4.0–10.5)
nRBC: 0 % (ref 0.0–0.2)

## 2021-12-04 LAB — MAGNESIUM: Magnesium: 1.7 mg/dL (ref 1.7–2.4)

## 2021-12-04 MED ORDER — LACTATED RINGERS IV SOLN: INTRAVENOUS | Status: DC

## 2021-12-04 MED ORDER — POTASSIUM PHOSPHATES 15 MMOLE/5ML IV SOLN
30.0000 mmol | Freq: Once | INTRAVENOUS | Status: AC
  Administered 2021-12-04: 30 mmol via INTRAVENOUS
  Filled 2021-12-04: qty 10

## 2021-12-04 MED ORDER — FENTANYL CITRATE PF 50 MCG/ML IJ SOSY: 25.0000 ug | PREFILLED_SYRINGE | INTRAMUSCULAR | Status: DC | PRN

## 2021-12-04 NOTE — Progress Notes (Signed)
Critical phosphorus less than 1.0 per lab. MD informed.

## 2021-12-04 NOTE — Progress Notes (Signed)
[facility] Progress Note  3 Days Post-Op  Subjective: Patient seen and examined.  She is resting comfortably in bed.  She tolerated her clear liquids without nausea and vomiting.  She is moving her bowels without issue.  She complains of 5-6/10 pain in her groin.    Objective: Vital signs in last 24 hours: Temp:  [97.6 F (36.4 C)-98.4 F (36.9 C)] 98.4 F (36.9 C) (10/14 0605) Pulse Rate:  [76-79] 76 (10/14 0605) Resp:  [16-20] 16 (10/14 0605) BP: (91-103)/(53-59) 103/59 (10/14 0605) SpO2:  [94 %-96 %] 94 % (10/14 0605) Last BM Date : 12/04/21  Intake/Output from previous day: 10/13 0701 - 10/14 0700 In: 2527.6 [P.O.:600; I.V.:1646.8; IV Piggyback:280.8] Out: 400 [Urine:400] Intake/Output this shift: Total I/O In: 120 [P.O.:120] Out: -   General appearance: alert, cooperative, and no distress GI: Abdomen soft, mild distention, no percussion tenderness, incisional TTP; no rigidity, guarding or rebound tenderness; right groin incision C/D/I with skin staples in place, mild erythema, ecchymosis of right labia  Lab Results:  Recent Labs    12/03/21 0438 12/04/21 0817  WBC 8.2 7.5  HGB 9.3* 7.8*  HCT 28.9* 24.6*  PLT 206 191   BMET Recent Labs    12/03/21 0438 12/04/21 0817  NA 141 136  K 4.3 3.6  CL 113* 107  CO2 21* 24  GLUCOSE 110* 102*  BUN 31* 25*  CREATININE 1.59* 1.23*  CALCIUM 7.3* 7.3*   PT/INR No results for input(s): "LABPROT", "INR" in the last 72 hours.  Studies/Results: No results found.  Anti-infectives: Anti-infectives (From admission, onward)    Start     Dose/Rate Route Frequency Ordered Stop   12/01/21 2000  cefoTEtan (CEFOTAN) 1 g in sodium chloride 0.9 % 100 mL IVPB        1 g 200 mL/hr over 30 Minutes Intravenous Every 12 hours 12/01/21 1353 12/02/21 2319   12/01/21 1032  ceFAZolin (ANCEF) 2-4 GM/100ML-% IVPB       Note to Pharmacy: Myna Bright: cabinet override      12/01/21 1032 12/01/21 2244        Assessment/Plan:  Patient is an 81 year old female who was admitted status post right femoral hernia repair with McVay and small bowel resection on 10/11 for an incarcerated right femoral hernia.  -Diet advanced to full liquids this morning, may advance to GI soft for dinner if patient tolerates fulls -PRN pain medications and antiemetics -Continue to monitor leg bruising.  Hemoglobin 7.8 this morning from 9.3.  Possible equilibration postop, but need to continue to monitor for need for possible transfusion -Appreciate hospitalist recommendations   LOS: 4 days    [place] 12/04/2021

## 2021-12-04 NOTE — Progress Notes (Signed)
PROGRESS NOTE  Robin [name] NKN:[id number] DOB: 1940/07/30   PCP: Chevis Pretty, FNP  Patient is from: Home  DOA: 11/30/2021 LOS: 4  Chief complaints Chief Complaint  Patient presents with   Abdominal Pain     Brief Narrative / Interim history: 81 year old F with PMH of HTN, HLD, osteoporosis and vitamin D deficiency presenting to ED with increased right inguinal pain after picking up a box, and admitted for partial SBO in the setting of right femoral hernia.    General surgery consulted, she underwent primary repair of femoral hernia with McVay repair and resection of the small bowel and primary anastomosis on 10/11.  NG tube discontinued on 10/13. General surgery following.  Advance diet to full liquid.  Subjective: Seen and examined earlier this morning.  No major events overnight of this morning.  No complaints other than feeling sore at surgical site.  Having bowel movements.  Denies nausea or vomiting.  Tolerated clear liquid diet and advance to full liquid diet this morning.  Objective: Vitals:   12/03/21 1234 12/03/21 2055 12/04/21 0605 12/04/21 1303  BP: (!) 91/53 (!) 93/54 (!) 103/59 (!) 93/52  Pulse: 79 79 76 77  Resp: '18 20 16   '$ Temp: 97.6 F (36.4 C) 98.1 F (36.7 C) 98.4 F (36.9 C) 98.8 F (37.1 C)  TempSrc: Oral Oral Oral Oral  SpO2: 96% 95% 94% 94%  Weight:      Height:        Examination:   GENERAL: No apparent distress.  Nontoxic. HEENT: MMM.  Vision and hearing grossly intact.  NECK: Supple.  No apparent JVD.  RESP:  No IWOB.  Fair aeration bilaterally. CVS:  RRR. Heart sounds normal.  ABD/GI/GU: BS+. Abd soft, NTND.  MSK/EXT:  Moves extremities. No apparent deformity. No edema.  SKIN: Honeycomb dressing over surgical site. NEURO: Awake and alert. Oriented appropriately.  No apparent focal neuro deficit. PSYCH: Calm. Normal affect.   Procedures:  10/11-Primary repair femoral hernia with McVay Repair, resection of small bowel and  primary anastomosis   Microbiology summarized: None  Assessment and plan: Principal Problem:   Strangulated femoral hernia Active Problems:   Osteoporosis   Mixed hyperlipidemia   Vitamin D insufficiency   Underweight   Essential hypertension   Abdominal pain   SBO (small bowel obstruction) (HCC)   Hyperkalemia   Hypercalcemia   Renal lesion   Failure to thrive in adult   Vomiting  Strangulated femoral hernia: s/p hernia repair and resection of small bowel with primary anastomosis on 10/11.  Having bowel movements. -General surgery following-advance diet to full liquid diet. -Continue IV fluid -General surgery following.   AKI: Improving. Recent Labs    12/21/20 1522 11/30/21 1620 11/30/21 1633 12/01/21 0438 12/02/21 0637 12/03/21 0438 12/04/21 0817  BUN 8 34* 31* 25* 20 31* 25*  CREATININE 0.75 1.67* 1.70* 1.20* 1.09* 1.59* 1.23*  -Continue IV fluid -Avoid nephrotoxic meds -Recheck renal function in the morning  Hyperkalemia/hypomagnesemia/hypophosphatemia:  -Monitor replenish as appropriate-  Non-anion gap metabolic acidosis: Resolved.   Iron deficiency anemia/vitamin B12 deficiency: Drop in Hgb likely postop and dilutional.  No signs of bleeding Recent Labs    12/21/20 1522 11/30/21 1620 11/30/21 1633 12/01/21 0438 12/02/21 0637 12/03/21 0438 12/04/21 0817  HGB 10.8* 12.8 12.9 9.9* 8.3* 9.3* 7.8*  -IV ferric gluconate 250 mg daily x2 doses -Vitamin B12 injection followed by p.o. -Monitor H&H  Left renal lesion: CT A/P showed indeterminate 11 mm exophytic lesion arising from the  upper left kidney -Renal MRI on a nonemergent basis was recommended   Essential hypertension: Blood pressure within acceptable range. -Continue IV hydralazine 10 mg every 6 hours as needed for SBP > 170   Mixed hyperlipidemia Osteoporosis-on Prolia injection outpatient. History of Vitamin D deficiency-vitamin D level normal.   Failure to thrive in an  adult/underweight: Prealbumin low at 11. Body mass index is 16.6 kg/m. Nutrition Problem: Inadequate oral intake Etiology: acute illness Signs/Symptoms: per patient/family report, meal completion < 25%, mild muscle depletion, mild fat depletion (clear liquid diet) Interventions: Boost Breeze   DVT prophylaxis:  SCDs Start: 11/30/21 2303  Code Status: Full code Family Communication: None at bedside Level of care: Telemetry Status is: Inpatient Remains inpatient appropriate because: Strangulated femoral hernia and AKI   Final disposition: Home with home health once medically ready. Consultants:  General surgery  Sch Meds:  Scheduled Meds:  aspirin EC  81 mg Oral Daily   atorvastatin  80 mg Oral Daily   cyanocobalamin  1,000 mcg Intramuscular Daily   Followed by   Derrill Memo ON 12/06/2021] vitamin B-12  1,000 mcg Oral Daily   escitalopram  10 mg Oral Daily   feeding supplement  1 Container Oral TID BM    Continuous Infusions:  lactated ringers 100 mL/hr at 12/04/21 1116   potassium PHOSPHATE IVPB (in mmol) 30 mmol (12/04/21 1115)   PRN Meds:.acetaminophen **OR** acetaminophen, hydrALAZINE, morphine injection, ondansetron **OR** ondansetron (ZOFRAN) IV, mouth rinse  Antimicrobials: Anti-infectives (From admission, onward)    Start     Dose/Rate Route Frequency Ordered Stop   12/01/21 2000  cefoTEtan (CEFOTAN) 1 g in sodium chloride 0.9 % 100 mL IVPB        1 g 200 mL/hr over 30 Minutes Intravenous Every 12 hours 12/01/21 1353 12/02/21 2319   12/01/21 1032  ceFAZolin (ANCEF) 2-4 GM/100ML-% IVPB       Note to Pharmacy: Myna Bright: cabinet override      12/01/21 1032 12/01/21 2244        I have personally reviewed the following labs and images: CBC: Recent Labs  Lab 11/30/21 1620 11/30/21 1633 12/01/21 0438 12/02/21 0637 12/03/21 0438 12/04/21 0817  WBC 9.1  --  8.0 2.5* 8.2 7.5  NEUTROABS 7.7  --   --   --   --   --   HGB 12.8 12.9 9.9* 8.3* 9.3* 7.8*   HCT 39.5 38.0 30.7* 25.0* 28.9* 24.6*  MCV 91.4  --  93.0 92.3 93.5 93.2  PLT 308  --  227 170 206 191   BMP &GFR Recent Labs  Lab 11/30/21 1620 11/30/21 1633 12/01/21 0438 12/02/21 0637 12/03/21 0438 12/04/21 0817  NA 139 135 139 139 141 136  K 5.5* 5.4* 4.6 4.2 4.3 3.6  CL 105 108 112* 111 113* 107  CO2 23  --  20* 23 21* 24  GLUCOSE 124* 122* 118* 98 110* 102*  BUN 34* 31* 25* 20 31* 25*  CREATININE 1.67* 1.70* 1.20* 1.09* 1.59* 1.23*  CALCIUM 10.4*  --  8.4* 7.6* 7.3* 7.3*  MG  --   --  1.4* 2.0 2.1 1.7  PHOS  --   --  3.0 2.3* 3.4 <1.0*   Estimated Creatinine Clearance: 24.5 mL/min (A) (by C-G formula based on SCr of 1.23 mg/dL (H)). Liver & Pancreas: Recent Labs  Lab 11/30/21 1620 12/01/21 0438 12/02/21 0637 12/03/21 0438 12/04/21 0817  AST '21 15 16  '$ --  17  ALT 11 9  6  --  8  ALKPHOS 57 40 31*  --  36*  BILITOT 1.2 1.1 0.8  --  0.7  PROT 7.6 5.6* 4.7*  --  4.5*  ALBUMIN 4.5 3.3* 2.5* 2.1* 2.1*   No results for input(s): "LIPASE", "AMYLASE" in the last 168 hours. No results for input(s): "AMMONIA" in the last 168 hours. Diabetic: No results for input(s): "HGBA1C" in the last 72 hours. No results for input(s): "GLUCAP" in the last 168 hours. Cardiac Enzymes: No results for input(s): "CKTOTAL", "CKMB", "CKMBINDEX", "TROPONINI" in the last 168 hours. No results for input(s): "PROBNP" in the last 8760 hours. Coagulation Profile: No results for input(s): "INR", "PROTIME" in the last 168 hours. Thyroid Function Tests: No results for input(s): "TSH", "T4TOTAL", "FREET4", "T3FREE", "THYROIDAB" in the last 72 hours. Lipid Profile: No results for input(s): "CHOL", "HDL", "LDLCALC", "TRIG", "CHOLHDL", "LDLDIRECT" in the last 72 hours. Anemia Panel: Recent Labs    12/02/21 0637  VITAMINB12 74*  FOLATE 5.1*  FERRITIN 120  TIBC 203*  IRON 7*  RETICCTPCT 0.7   Urine analysis: No results found for: "COLORURINE", "APPEARANCEUR", "LABSPEC", "PHURINE",  "GLUCOSEU", "HGBUR", "BILIRUBINUR", "KETONESUR", "PROTEINUR", "UROBILINOGEN", "NITRITE", "LEUKOCYTESUR" Sepsis Labs: Invalid input(s): "PROCALCITONIN", "LACTICIDVEN"  Microbiology: No results found for this or any previous visit (from the past 240 hour(s)).  Radiology Studies: No results found.    Arnell Slivinski T. [place]  If 7PM-7AM, please contact night-coverage [email or web address] 12/04/2021, 3:18 PM

## 2021-12-05 LAB — COMPREHENSIVE METABOLIC PANEL
ALT: 7 U/L (ref 0–44)
AST: 15 U/L (ref 15–41)
Albumin: 2 g/dL — ABNORMAL LOW (ref 3.5–5.0)
Alkaline Phosphatase: 39 U/L (ref 38–126)
Anion gap: 4 — ABNORMAL LOW (ref 5–15)
BUN: 13 mg/dL (ref 8–23)
CO2: 26 mmol/L (ref 22–32)
Calcium: 7.6 mg/dL — ABNORMAL LOW (ref 8.9–10.3)
Chloride: 108 mmol/L (ref 98–111)
Creatinine, Ser: 0.89 mg/dL (ref 0.44–1.00)
GFR, Estimated: >60 mL/min (ref >60)
Glucose, Bld: 95 mg/dL (ref 70–99)
Potassium: 4 mmol/L (ref 3.5–5.1)
Sodium: 138 mmol/L (ref 135–145)
Total Bilirubin: 0.7 mg/dL (ref 0.3–1.2)
Total Protein: 4.3 g/dL — ABNORMAL LOW (ref 6.5–8.1)

## 2021-12-05 LAB — CBC
HCT: 21.3 % — ABNORMAL LOW (ref 36.0–46.0)
HCT: 22.3 % — ABNORMAL LOW (ref 36.0–46.0)
Hemoglobin: 6.9 g/dL — CL (ref 12.0–15.0)
Hemoglobin: 7.3 g/dL — ABNORMAL LOW (ref 12.0–15.0)
MCH: 29.9 pg (ref 26.0–34.0)
MCH: 30.2 pg (ref 26.0–34.0)
MCHC: 32.4 g/dL (ref 30.0–36.0)
MCHC: 32.7 g/dL (ref 30.0–36.0)
MCV: 92.2 fL (ref 80.0–100.0)
MCV: 92.1 fL (ref 80.0–100.0)
Platelets: 173 10*3/uL (ref 150–400)
Platelets: 173 10*3/uL (ref 150–400)
RBC: 2.31 MIL/uL — ABNORMAL LOW (ref 3.87–5.11)
RBC: 2.42 MIL/uL — ABNORMAL LOW (ref 3.87–5.11)
RDW: 14.2 % (ref 11.5–15.5)
RDW: 14.2 % (ref 11.5–15.5)
WBC: 5.4 10*3/uL (ref 4.0–10.5)
WBC: 5.8 10*3/uL (ref 4.0–10.5)
nRBC: 0 % (ref 0.0–0.2)
nRBC: 0 % (ref 0.0–0.2)

## 2021-12-05 LAB — PHOSPHORUS: Phosphorus: 2.2 mg/dL — ABNORMAL LOW (ref 2.5–4.6)

## 2021-12-05 LAB — ABO/RH: ABO/RH(D): O POS

## 2021-12-05 LAB — PREPARE RBC (CROSSMATCH)

## 2021-12-05 LAB — MAGNESIUM: Magnesium: 1.5 mg/dL — ABNORMAL LOW (ref 1.7–2.4)

## 2021-12-05 MED ORDER — SODIUM CHLORIDE 0.9% IV SOLUTION: Freq: Once | INTRAVENOUS | Status: AC

## 2021-12-05 MED ORDER — SODIUM PHOSPHATES 45 MMOLE/15ML IV SOLN
15.0000 mmol | Freq: Once | INTRAVENOUS | Status: AC
  Administered 2021-12-05: 15 mmol via INTRAVENOUS
  Filled 2021-12-05: qty 5

## 2021-12-05 MED ORDER — MAGNESIUM SULFATE 2 GM/50ML IV SOLN
2.0000 g | Freq: Once | INTRAVENOUS | Status: AC
  Administered 2021-12-05: 2 g via INTRAVENOUS
  Filled 2021-12-05: qty 50

## 2021-12-05 NOTE — Progress Notes (Signed)
[facility] Progress Note  4 Days Post-Op  Subjective: Patient seen and examined.  She is resting comfortably in bed.  She tolerated full liquids without nausea and vomiting.  She continues to move her bowels without issue.  She denies any blood in her bowel movements or dark bowel movements.  Her pain is relatively well controlled at this time.  Objective: Vital signs in last 24 hours: Temp:  [98.6 F (37 C)-99.8 F (37.7 C)] 98.6 F (37 C) (10/15 0450) Pulse Rate:  [73-82] 73 (10/15 0450) BP: (93-102)/(47-63) 102/63 (10/15 0450) SpO2:  [94 %-96 %] 94 % (10/15 0450) Last BM Date : 12/04/21  Intake/Output from previous day: 10/14 0701 - 10/15 0700 In: 799.2 [P.O.:720; IV Piggyback:79.2] Out: -  Intake/Output this shift: No intake/output data recorded.  General appearance: alert, cooperative, and no distress GI: Abdomen soft, mild distention, no percussion tenderness, incisional tenderness to palpation; no rigidity, guarding, rebound tenderness; right groin incision C/C/I with skin staples in place, mild erythema and ecchymosis, stable from yesterday  Lab Results:  Recent Labs    12/04/21 0817 12/05/21 0447  WBC 7.5 5.4  HGB 7.8* 6.9*  HCT 24.6* 21.3*  PLT 191 173   BMET Recent Labs    12/04/21 0817 12/05/21 0447  NA 136 138  K 3.6 4.0  CL 107 108  CO2 24 26  GLUCOSE 102* 95  BUN 25* 13  CREATININE 1.23* 0.89  CALCIUM 7.3* 7.6*   PT/INR No results for input(s): "LABPROT", "INR" in the last 72 hours.  Studies/Results: No results found.  Anti-infectives: Anti-infectives (From admission, onward)    Start     Dose/Rate Route Frequency Ordered Stop   12/01/21 2000  cefoTEtan (CEFOTAN) 1 g in sodium chloride 0.9 % 100 mL IVPB        1 g 200 mL/hr over 30 Minutes Intravenous Every 12 hours 12/01/21 1353 12/02/21 2319   12/01/21 1032  ceFAZolin (ANCEF) 2-4 GM/100ML-% IVPB       Note to Pharmacy: Myna Bright: cabinet override       12/01/21 1032 12/01/21 2244       Assessment/Plan:  Patient is an 81 year old female who was admitted status post right femoral hernia repair with McVay and small bowel resection on 10/11 for an incarcerated right femoral hernia.   -Soft diet -PRN pain medications and antiemetics -Hemoglobin trended down to 6.9 from 7.8 -Recommend transfusion of 1 unit PRBCs -Continue to monitor hemoglobin levels-incision site, thigh, and labia stable in regards to ecchymosis -Patient without bright red blood or dark black bowel movements per nurse. -Appreciate hospitalist recommendations   LOS: 5 days    Stacyann Mcconaughy A Mashanda Ishibashi 12/05/2021

## 2021-12-05 NOTE — Progress Notes (Signed)
Blood consent obtained and placed in chart. Blood transfusion started @ 1545.

## 2021-12-05 NOTE — Progress Notes (Signed)
PROGRESS NOTE  Robin [name] YKD:[id number] DOB: [birth year]   PCP: Chevis Pretty, FNP  Patient is from: Home  DOA: 11/30/2021 LOS: 5  Chief complaints Chief Complaint  Patient presents with   Abdominal Pain     Brief Narrative / Interim history: 81 year old F with PMH of HTN, HLD, osteoporosis and vitamin D deficiency presenting to ED with increased right inguinal pain after picking up a box, and admitted for partial SBO in the setting of right femoral hernia.    General surgery consulted, she underwent primary repair of femoral hernia with McVay repair and resection of the small bowel and primary anastomosis on 10/11.  NG tube discontinued on 10/13. General surgery following.  Advance diet to soft diet.  Patient had gradual but significant drop in Hgb from 12.8 on admission to 6.9.  Transfused 1 unit.  Anemia panel with severe iron and B12 deficiency.  Receiving IV iron and B12 injection.   Subjective: Seen and examined earlier this morning.  No major events overnight of this morning.  Hemoglobin down to 6.9 earlier this morning.  Improved to 7.3 after stopping IV fluid.  Patient denies melena or hematochezia.  No ecchymosis, hematoma or signs of bleeding at surgical site.  Reported surgical EBL "minimal".  Objective: Vitals:   12/04/21 0605 12/04/21 1303 12/04/21 2138 12/05/21 0450  BP: (!) 103/59 (!) 93/52 (!) 95/47 102/63  Pulse: 76 77 82 73  Resp: 16     Temp: 98.4 F (36.9 C) 98.8 F (37.1 C) 99.8 F (37.7 C) 98.6 F (37 C)  TempSrc: Oral Oral    SpO2: 94% 94% 96% 94%  Weight:      Height:        Examination:  GENERAL: No apparent distress.  Nontoxic. HEENT: MMM.  Vision and hearing grossly intact.  NECK: Supple.  No apparent JVD.  RESP:  No IWOB.  Fair aeration bilaterally. CVS:  RRR. Heart sounds normal.  ABD/GI/GU: BS+. Abd soft, NTND.  MSK/EXT:  Moves extremities. No apparent deformity. No edema.  SKIN: Surgical site DCI.  Staples in  place. NEURO: Awake and alert. Oriented appropriately.  No apparent focal neuro deficit. PSYCH: Calm. Normal affect.   Procedures:  10/11-Primary repair femoral hernia with McVay Repair, resection of small bowel and primary anastomosis   Microbiology summarized: None  Assessment and plan: Principal Problem:   Strangulated femoral hernia Active Problems:   Osteoporosis   Mixed hyperlipidemia   Vitamin D insufficiency   Underweight   Essential hypertension   Abdominal pain   SBO (small bowel obstruction) (HCC)   Hyperkalemia   Hypercalcemia   Renal lesion   Failure to thrive in adult   Vomiting  Strangulated femoral hernia: s/p hernia repair and resection of small bowel with primary anastomosis on 10/11.  Having bowel movements and tolerating diet. -General surgery following-advance diet to soft. -Discontinue IV fluid -General surgery following.   AKI: Resolved. Recent Labs    12/21/20 1522 11/30/21 1620 11/30/21 1633 12/01/21 0438 12/02/21 3976 12/03/21 0438 12/04/21 0817 12/05/21 0447  BUN 8 34* 31* 25* 20 31* 25* 13  CREATININE 0.75 1.67* 1.70* 1.20* 1.09* 1.59* 1.23* 0.89  -Discontinue IV fluid -Avoid nephrotoxic meds  Hyperkalemia/hypomagnesemia/hypophosphatemia: Improved. -Monitor replenish as appropriate-  Non-anion gap metabolic acidosis: Resolved.   Iron deficiency anemia/vitamin B12 deficiency: Drop in Hgb likely dilutional.  Surgical EBL "minimal".  Anemia panel with severe iron deficiency and B12 deficiency.  Patient denies melena or hematochezia.  Recent Labs  12/21/20 1522 11/30/21 1620 11/30/21 1633 12/01/21 0438 12/02/21 9622 12/03/21 0438 12/04/21 0817 12/05/21 0447 12/05/21 1002  HGB 10.8* 12.8 12.9 9.9* 8.3* 9.3* 7.8* 6.9* 7.3*  -Received IV ferric gluconate 250 mg daily x2 doses -Vitamin B12 injection followed by p.o. -Transfuse 1 unit -Discontinue IV fluid -Monitor H&H -Check Hemoccult -SCD for VTE prophylaxis  Left renal  lesion: CT A/P showed indeterminate 11 mm exophytic lesion arising from the upper left kidney -Renal MRI on a nonemergent basis was recommended   Essential hypertension: Blood pressure within acceptable range. -Continue IV hydralazine 10 mg every 6 hours as needed for SBP > 170   Mixed hyperlipidemia Osteoporosis-on Prolia injection outpatient. History of Vitamin D deficiency-vitamin D level normal.   Failure to thrive in an adult/underweight: Prealbumin low at 11. Body mass index is 16.6 kg/m. Nutrition Problem: Inadequate oral intake Etiology: acute illness Signs/Symptoms: per patient/family report, meal completion < 25%, mild muscle depletion, mild fat depletion (clear liquid diet) Interventions: Boost Breeze   DVT prophylaxis:  Place and maintain sequential compression device Start: 12/05/21 0815 SCDs Start: 11/30/21 2303  Code Status: Full code Family Communication: None at bedside Level of care: Telemetry Status is: Inpatient Remains inpatient appropriate because: Strangulated femoral hernia and AKI   Final disposition: Home with home health once medically ready. Consultants:  General surgery  Sch Meds:  Scheduled Meds:  sodium chloride   Intravenous Once   atorvastatin  80 mg Oral Daily   [START ON 12/06/2021] vitamin B-12  1,000 mcg Oral Daily   escitalopram  10 mg Oral Daily   feeding supplement  1 Container Oral TID BM    Continuous Infusions:  sodium phosphate 15 mmol in dextrose 5 % 250 mL infusion 15 mmol (12/05/21 1151)   PRN Meds:.acetaminophen **OR** acetaminophen, fentaNYL (SUBLIMAZE) injection, hydrALAZINE, ondansetron **OR** ondansetron (ZOFRAN) IV, mouth rinse  Antimicrobials: Anti-infectives (From admission, onward)    Start     Dose/Rate Route Frequency Ordered Stop   12/01/21 2000  cefoTEtan (CEFOTAN) 1 g in sodium chloride 0.9 % 100 mL IVPB        1 g 200 mL/hr over 30 Minutes Intravenous Every 12 hours 12/01/21 1353 12/02/21 2319    12/01/21 1032  ceFAZolin (ANCEF) 2-4 GM/100ML-% IVPB       Note to Pharmacy: Myna Bright: cabinet override      12/01/21 1032 12/01/21 2244        I have personally reviewed the following labs and images: CBC: Recent Labs  Lab 11/30/21 1620 11/30/21 1633 12/02/21 0637 12/03/21 0438 12/04/21 0817 12/05/21 0447 12/05/21 1002  WBC 9.1   < > 2.5* 8.2 7.5 5.4 5.8  NEUTROABS 7.7  --   --   --   --   --   --   HGB 12.8   < > 8.3* 9.3* 7.8* 6.9* 7.3*  HCT 39.5   < > 25.0* 28.9* 24.6* 21.3* 22.3*  MCV 91.4   < > 92.3 93.5 93.2 92.2 92.1  PLT 308   < > 170 206 191 173 173   < > = values in this interval not displayed.   BMP &GFR Recent Labs  Lab 12/01/21 0438 12/02/21 0637 12/03/21 0438 12/04/21 0817 12/05/21 0447  NA 139 139 141 136 138  K 4.6 4.2 4.3 3.6 4.0  CL 112* 111 113* 107 108  CO2 20* 23 21* 24 26  GLUCOSE 118* 98 110* 102* 95  BUN 25* 20 31* 25* 13  CREATININE  1.20* 1.09* 1.59* 1.23* 0.89  CALCIUM 8.4* 7.6* 7.3* 7.3* 7.6*  MG 1.4* 2.0 2.1 1.7 1.5*  PHOS 3.0 2.3* 3.4 <1.0* 2.2*   Estimated Creatinine Clearance: 33.8 mL/min (by C-G formula based on SCr of 0.89 mg/dL). Liver & Pancreas: Recent Labs  Lab 11/30/21 1620 12/01/21 0438 12/02/21 0637 12/03/21 0438 12/04/21 0817 12/05/21 0447  AST '21 15 16  '$ --  17 15  ALT '11 9 6  '$ --  8 7  ALKPHOS 57 40 31*  --  36* 39  BILITOT 1.2 1.1 0.8  --  0.7 0.7  PROT 7.6 5.6* 4.7*  --  4.5* 4.3*  ALBUMIN 4.5 3.3* 2.5* 2.1* 2.1* 2.0*   No results for input(s): "LIPASE", "AMYLASE" in the last 168 hours. No results for input(s): "AMMONIA" in the last 168 hours. Diabetic: No results for input(s): "HGBA1C" in the last 72 hours. No results for input(s): "GLUCAP" in the last 168 hours. Cardiac Enzymes: No results for input(s): "CKTOTAL", "CKMB", "CKMBINDEX", "TROPONINI" in the last 168 hours. No results for input(s): "PROBNP" in the last 8760 hours. Coagulation Profile: No results for input(s): "INR", "PROTIME" in  the last 168 hours. Thyroid Function Tests: No results for input(s): "TSH", "T4TOTAL", "FREET4", "T3FREE", "THYROIDAB" in the last 72 hours. Lipid Profile: No results for input(s): "CHOL", "HDL", "LDLCALC", "TRIG", "CHOLHDL", "LDLDIRECT" in the last 72 hours. Anemia Panel: No results for input(s): "VITAMINB12", "FOLATE", "FERRITIN", "TIBC", "IRON", "RETICCTPCT" in the last 72 hours.  Urine analysis: No results found for: "COLORURINE", "APPEARANCEUR", "LABSPEC", "PHURINE", "GLUCOSEU", "HGBUR", "BILIRUBINUR", "KETONESUR", "PROTEINUR", "UROBILINOGEN", "NITRITE", "LEUKOCYTESUR" Sepsis Labs: Invalid input(s): "PROCALCITONIN", "LACTICIDVEN"  Microbiology: No results found for this or any previous visit (from the past 240 hour(s)).  Radiology Studies: No results found.    Jaida Basurto T. Matthews  If 7PM-7AM, please contact night-coverage [email or web address] 12/05/2021, 1:37 PM

## 2021-12-06 DIAGNOSIS — K56609 Unspecified intestinal obstruction, unspecified as to partial versus complete obstruction: Secondary | ICD-10-CM | POA: Diagnosis not present

## 2021-12-06 DIAGNOSIS — R627 Adult failure to thrive: Secondary | ICD-10-CM | POA: Diagnosis not present

## 2021-12-06 DIAGNOSIS — K4130 Unilateral femoral hernia, with obstruction, without gangrene, not specified as recurrent: Secondary | ICD-10-CM | POA: Diagnosis not present

## 2021-12-06 DIAGNOSIS — R109 Unspecified abdominal pain: Secondary | ICD-10-CM | POA: Diagnosis not present

## 2021-12-06 LAB — BPAM RBC
Blood Product Expiration Date: 202311112359
ISSUE DATE / TIME: 202310151533
Unit Type and Rh: 5100

## 2021-12-06 LAB — TYPE AND SCREEN
ABO/RH(D): O POS
Antibody Screen: NEGATIVE
Unit division: 0

## 2021-12-06 LAB — RENAL FUNCTION PANEL
Albumin: 2.1 g/dL — ABNORMAL LOW (ref 3.5–5.0)
Anion gap: 5 (ref 5–15)
BUN: 11 mg/dL (ref 8–23)
CO2: 27 mmol/L (ref 22–32)
Calcium: 8.2 mg/dL — ABNORMAL LOW (ref 8.9–10.3)
Chloride: 105 mmol/L (ref 98–111)
Creatinine, Ser: 0.86 mg/dL (ref 0.44–1.00)
GFR, Estimated: >60 mL/min (ref >60)
Glucose, Bld: 92 mg/dL (ref 70–99)
Phosphorus: 3 mg/dL (ref 2.5–4.6)
Potassium: 3.6 mmol/L (ref 3.5–5.1)
Sodium: 137 mmol/L (ref 135–145)

## 2021-12-06 LAB — CBC
HCT: 24.2 % — ABNORMAL LOW (ref 36.0–46.0)
Hemoglobin: 8.2 g/dL — ABNORMAL LOW (ref 12.0–15.0)
MCH: 30.5 pg (ref 26.0–34.0)
MCHC: 33.9 g/dL (ref 30.0–36.0)
MCV: 90 fL (ref 80.0–100.0)
Platelets: 175 10*3/uL (ref 150–400)
RBC: 2.69 MIL/uL — ABNORMAL LOW (ref 3.87–5.11)
RDW: 14.4 % (ref 11.5–15.5)
WBC: 4.9 10*3/uL (ref 4.0–10.5)
nRBC: 0 % (ref 0.0–0.2)

## 2021-12-06 LAB — PHOSPHORUS: Phosphorus: 3 mg/dL (ref 2.5–4.6)

## 2021-12-06 LAB — MAGNESIUM: Magnesium: 1.6 mg/dL — ABNORMAL LOW (ref 1.7–2.4)

## 2021-12-06 MED ORDER — CYANOCOBALAMIN 1000 MCG PO TABS: 1000.0000 ug | ORAL_TABLET | Freq: Every day | ORAL | 1 refills | Status: AC

## 2021-12-06 MED ORDER — MAGNESIUM SULFATE 2 GM/50ML IV SOLN
2.0000 g | Freq: Once | INTRAVENOUS | Status: AC
  Administered 2021-12-06: 2 g via INTRAVENOUS
  Filled 2021-12-06: qty 50

## 2021-12-06 MED ORDER — SENNOSIDES-DOCUSATE SODIUM 8.6-50 MG PO TABS: 1.0000 | ORAL_TABLET | Freq: Two times a day (BID) | ORAL | 0 refills | Status: AC | PRN

## 2021-12-06 MED ORDER — FERROUS SULFATE 325 (65 FE) MG PO TBEC: 325.0000 mg | DELAYED_RELEASE_TABLET | Freq: Two times a day (BID) | ORAL | 1 refills | Status: DC

## 2021-12-06 NOTE — Progress Notes (Signed)
[facility]  Soreness improving.  BP 125/60 (BP Location: Right Arm)   Pulse 63   Temp 98.7 F (37.1 C)   Resp 16   Ht '5\' 3"'$  (1.6 m)   Wt 42.5 kg   SpO2 93%   BMI 16.60 kg/m   Bruising improved. Staples c/d/I and I do not really see any erythema   Diet as tolerated Stool softeners  Future Appointments  Date Time Provider [place]  12/16/2021  1:00 PM Virl Cagey, MD [facility] None  12/20/2021  2:15 PM Chevis Pretty, [place] [facility] None    Curlene Labrum, MD [facility] [address] [place], [place] [address] [phone] (office)

## 2021-12-06 NOTE — Progress Notes (Signed)
Patient stable and ready for discharge home. Ivs removed. Patient dressed herself. Writer went over paperwork with patient and she verbalized understanding.patient discharged via EMS home.

## 2021-12-06 NOTE — TOC Transition Note (Signed)
Transition of Care [facility]) - CM/SW Discharge Note   Patient Details  Name: Robin [name] MRN: [id number] Date of Birth: 02-21-1941  [facility] CM/SW Contact:  Shade Flood, LCSW Phone Number: 12/06/2021, 12:36 PM   Clinical Narrative:     Pt stable for dc home today. Updated Marjory Lies at [facility]. [employer] [facility] will follow up with pt regarding possible delivery of a tub transfer bunch.   No other TOC needs for dc.  Final next level of care: [place] Barriers to Discharge: Barriers Resolved   Patient Goals and CMS Choice Patient states their goals for this hospitalization and ongoing recovery are:: go home   Choice offered to / list presented to : Patient  Discharge Placement                       Discharge Plan and Services                DME Arranged: Tub bench DME Agency: [employer] Date DME Agency Contacted: 12/06/21   Representative spoke with at DME Agency: [place]: PT [place]: [place] Date [place]: 12/03/21   Representative spoke with at [place]: [place] ([place]) Interventions     Readmission Risk Interventions    12/02/2021   12:17 PM  Readmission Risk Prevention Plan  Medication Screening Complete  Transportation Screening Complete

## 2021-12-06 NOTE — Discharge Summary (Signed)
Physician Discharge Summary  Robin [name] DZH:[id number] DOB: 04-25-1940 DOA: 11/30/2021  PCP: Chevis Pretty, FNP  Admit date: 11/30/2021 Discharge date: 12/06/2021 Admitted From: Home Disposition: Home Recommendations for Outpatient Follow-up:  Follow up with PCP in 1 to 2 weeks Outpatient follow-up with general surgery for staple removal and surgical wound check on 12/16/2021 Check CBC and BMP in 1 to 2 weeks Renal MRI outpatient for incidental finding of exophytic renal lesion on CT abdomen and pelvis Please follow up on the following pending results: None  Home Health: PT/CSW Equipment/Devices: None  Discharge Condition: Stable CODE STATUS: Full code  Follow-up Information     [facility] Follow up.   Specialty: Home Health Services Why: PT will call to schedule your first home visit. Contact information: [address] [place] [address] [phone]         Virl Cagey, MD Follow up on 12/16/2021.   Specialty: General Surgery Why: staple removal Contact information: [address] [facility] [address] [phone]         Chevis Pretty, FNP. Schedule an appointment as soon as possible for a visit in 1 week(s).   Specialty: Family Medicine Contact information: [place] [address] [facility] course 81 year old F with PMH of HTN, HLD, osteoporosis and vitamin D deficiency presenting to ED with increased right inguinal pain after picking up a box, and admitted for partial SBO in the setting of right femoral hernia.     General surgery consulted, she underwent primary repair of femoral hernia with McVay repair and resection of the small bowel and primary anastomosis on 10/11.  NG tube discontinued on 10/13.  She  tolerated soft diet and cleared for discharge by general surgery for outpatient follow-up on 12/16/2021.   Patient had gradual but  significant drop in Hgb from 12.8 on admission to 6.9 partly due to hemodilution due to recurrence IV hydration.  Hemoglobin improved to 7.3 once IV fluid stopped.  She was transfused 1 unit with appropriate response.  Anemia panel with severe iron and B12 deficiency.  Receiving IV iron and B12 injection. Discharged on p.o. iron and vitamin B12.  Recommend checking CBC in 1 to 2 weeks.  See individual problem list below for more.   Problems addressed during this hospitalization Principal Problem:   Strangulated femoral hernia Active Problems:   Osteoporosis   Mixed hyperlipidemia   Vitamin D insufficiency   Underweight   Essential hypertension   Abdominal pain   SBO (small bowel obstruction) (HCC)   Hyperkalemia   Hypercalcemia   Renal lesion   Failure to thrive in adult   Vomiting   Strangulated femoral hernia: s/p hernia repair and resection of small bowel with primary anastomosis on 10/11.  Heart regular bowel movements.  Tolerated soft diet. -Outpatient follow-up with general surgery on 12/16/2021   AKI: Resolved.   Hyperkalemia/hypomagnesemia/hypophosphatemia: Received IV magnesium sulfate prior to discharge.  The rest resolved.   Non-anion gap metabolic acidosis: Resolved.   Iron deficiency anemia/vitamin B12 deficiency: Drop in Hgb likely dilutional.  Surgical EBL "minimal".  Anemia panel with severe iron deficiency and B12 deficiency.  No melena or hematochezia.  Transfused 1 unit with appropriate response. Recent Labs    12/21/20 1522 11/30/21 1620 11/30/21 1633 12/01/21 0438 12/02/21 8185 12/03/21 0438 12/04/21 0817 12/05/21 0447 12/05/21 1002 12/06/21 0655  HGB 10.8* 12.8  12.9 9.9* 8.3* 9.3* 7.8* 6.9* 7.3* 8.2*  -Received IV ferric gluconate 250 mg daily x2 doses and discharged on p.o. ferrous sulfate -Vitamin B12 injection followed by p.o. -Bowel regimen   Left renal lesion: CT A/P showed indeterminate 11 mm exophytic lesion arising from the upper left  kidney -Renal MRI on a nonemergent basis was recommended   Essential hypertension: Blood pressure within acceptable range. -Continue IV hydralazine 10 mg every 6 hours as needed for SBP > 170   Mixed hyperlipidemia Osteoporosis-on Prolia injection outpatient. History of Vitamin D deficiency-vitamin D level normal.   Failure to thrive in an adult/underweight:  Nutrition Problem: Inadequate oral intake Etiology: acute illness Signs/Symptoms: per patient/family report, meal completion < 25%, mild muscle depletion, mild fat depletion (clear liquid diet) Interventions: Boost Breeze     Vital signs Vitals:   12/05/21 1600 12/05/21 1902 12/05/21 2033 12/06/21 0502  BP: 119/61 108/60 115/60 125/60  Pulse: 77 68 76 63  Temp: 98.3 F (36.8 C) 98.4 F (36.9 C) 98.2 F (36.8 C) 98.7 F (37.1 C)  Resp: '18 16 20 16  '$ Height:      Weight:      SpO2: 97% 96% 95% 93%  TempSrc:  Oral    BMI (Calculated):         Discharge exam  GENERAL: No apparent distress.  Nontoxic. HEENT: MMM.  Vision and hearing grossly intact.  NECK: Supple.  No apparent JVD.  RESP:  No IWOB.  Fair aeration bilaterally. CVS:  RRR. Heart sounds normal.  ABD/GI/GU: BS+. Abd soft, NTND.  MSK/EXT:  Moves extremities.  Significant muscle mass and subcu fat loss. SKIN: Surgical wound DCI.  Staples in place. NEURO: Awake and alert. Oriented appropriately.  No apparent focal neuro deficit. PSYCH: Calm. Normal affect.   Discharge Instructions Discharge Instructions     Call MD for:  difficulty breathing, headache or visual disturbances   Complete by: As directed    Call MD for:  extreme fatigue   Complete by: As directed    Call MD for:  persistant dizziness or light-headedness   Complete by: As directed    Call MD for:  persistant nausea and vomiting   Complete by: As directed    Call MD for:  redness, tenderness, or signs of infection (pain, swelling, redness, odor or green/yellow discharge around incision  site)   Complete by: As directed    Call MD for:  severe uncontrolled pain   Complete by: As directed    Diet general   Complete by: As directed    Discharge instructions   Complete by: As directed    It has been a pleasure taking care of you!  You were hospitalized due to bowel obstruction and hernia for which you have had a surgery.  Please follow-up with the surgeon on 12/16/2021 to have the staples removed.  You can shower but do not lie in bath tub until the surgical wound heals completely.  Follow-up with your primary care doctor in 1 to 2 weeks or sooner if needed.  Please review your new medication list and the directions on your medications before you take them.   Take care,   Increase activity slowly   Complete by: As directed    No wound care   Complete by: As directed       Allergies as of 12/06/2021   No Known Allergies      Medication List     TAKE these medications  aspirin EC 81 MG tablet Take 81 mg by mouth daily.   atorvastatin 80 MG tablet Commonly known as: LIPITOR Take 1 tablet (80 mg total) by mouth daily.   cyanocobalamin 1000 MCG tablet Take 1 tablet (1,000 mcg total) by mouth daily. Start taking on: December 07, 2021   Ensure Plus Liqd Take 1 Can by mouth daily. Dx:  R63.6 (underweight)   escitalopram 10 MG tablet Commonly known as: Lexapro Take 1 tablet (10 mg total) by mouth daily.   ferrous sulfate 325 (65 FE) MG EC tablet Take 1 tablet (325 mg total) by mouth 2 (two) times daily.   lisinopril 10 MG tablet Commonly known as: ZESTRIL Take 1 tablet (10 mg total) by mouth daily.   Prolia 60 MG/ML Sosy injection Generic drug: denosumab BRING TO OFFICE FOR ADMINISTRATION. ADMINISTER IN UPPER ARM, THIGH OR ABDOMEN   senna-docusate 8.6-50 MG tablet Commonly known as: Senokot-S Take 1 tablet by mouth 2 (two) times daily between meals as needed for moderate constipation.   Vitamin D 50 MCG (2000 UT) tablet Take 4000IU on saturdays  and sundays and 2000IU all other days. What changed:  how much to take how to take this when to take this additional instructions               Durable Medical Equipment  (From admission, onward)           Start     Ordered   12/06/21 1107  For home use only DME Other see comment  Once       Comments: Tub Bench  Question:  Length of Need  Answer:  Lifetime   12/06/21 1107            Consultations: General surgery  Procedures/Studies: 10/11-Primary repair femoral hernia with McVay Repair, resection of small bowel and primary anastomosis    DG Chest Port 1 View  Result Date: 12/01/2021 CLINICAL DATA:  [id number] Encounter for imaging study to confirm nasogastric (NG) tube placement 195093 EXAM: PORTABLE CHEST 1 VIEW COMPARISON:  Radiograph 11/18/2019 FINDINGS: Nasogastric tube tip and side port overlie the stomach. Cardiomediastinal silhouette is within normal limits. Pulmonary hyperinflation. No airspace consolidation. No pleural effusion. No pneumothorax. 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 Dilated loop of small bowel in the right hemiabdomen. IMPRESSION: Nasogastric tube tip and side port overlie the stomach. Dilated loop of small bowel in the right hemiabdomen, potentially reflecting obstruction as noted on recent CT. Electronically Signed   By: Maurine Simmering M.D.   On: 12/01/2021 13:37   CT ABDOMEN PELVIS W CONTRAST  Result Date: 11/30/2021 CLINICAL DATA:  Hernia, complicated possible incarcerated L inguinal hernia EXAM: CT ABDOMEN AND PELVIS WITH CONTRAST TECHNIQUE: Multidetector CT imaging of the abdomen and pelvis was performed using the standard protocol following bolus administration of intravenous contrast. RADIATION DOSE REDUCTION: This exam was performed according to the departmental dose-optimization program which includes automated exposure control, adjustment of the mA and/or kV according to patient size and/or use of  iterative reconstruction technique. CONTRAST:  79m OMNIPAQUE IOHEXOL 300 MG/ML  SOLN COMPARISON:  None Available. FINDINGS: Lower chest: Clear lung bases. Hepatobiliary: 18 and 24 mm cysts in the left hepatic lobe. Additional scattered subcentimeter hypodensities are too small to accurately characterize but also likely cysts. No specific imaging follow-up is recommended. No suspicious liver lesion. No calcified gallstone or pericholecystic inflammation. Probable gallbladder Phrygian cap. No biliary dilatation. Pancreas: No ductal dilatation or inflammation. Spleen: Normal in size  without focal abnormality. Small splenule inferiorly. 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 Given patient age and size, no specific imaging follow-up is needed. Adrenals/Urinary Tract: No adrenal nodule. No hydronephrosis. There is thinning of the bilateral renal parenchyma, more so on the left. Multiple low-density lesions within both kidneys, majority are too small to characterize but likely cysts. No imaging follow-up of these lesions is needed. However there is an exophytic 11 mm lesion arising from the upper left kidney with Hounsfield units of 83, indeterminate. No visible renal stones. Partially distended urinary bladder, normal for degree of distension. Stomach/Bowel: Right inguinal hernia with narrow neck contains a short segment of fluid-filled small bowel. There is no definite associated fat stranding or inflammation, although the small bowel proximal to this is prominent and mildly dilated. No bowel pneumatosis. The exiting small bowel is decompressed. Small hiatal hernia with otherwise decompressed stomach. The colon is near completely decompressed. There are enteric chain sutures in the ascending colon. The appendix is not seen. Vascular/Lymphatic: Moderate aortic atherosclerosis. No aneurysm. Patent portal and splenic veins. Patent mesenteric veins. No mesenteric venous gas. Reproductive: Uterus and bilateral  adnexa are unremarkable. Other: Small to moderate volume of free fluid in the dependent pelvis is likely reactive. No free air or focal fluid collection. Musculoskeletal: The bones are diffusely under mineralized. Scoliosis and degenerative change in the spine. There are no acute or suspicious osseous abnormalities. IMPRESSION: 1. Right inguinal hernia with narrow neck contains a short segment of fluid-filled small bowel. There is no definite associated fat stranding or inflammation, although the small bowel proximal to this is prominent and mildly dilated, suspicious for early or partial small bowel obstruction. 2. Small to moderate volume of free fluid in the dependent pelvis is likely reactive. 3. Indeterminate 11 mm exophytic lesion arising from the upper left kidney. Recommend renal protocol MRI for characterization on a nonemergent basis. Aortic Atherosclerosis (ICD10-I70.0). Electronically Signed   By: Keith Rake M.D.   On: 11/30/2021 18:57       The results of significant diagnostics from this hospitalization (including imaging, microbiology, ancillary and laboratory) are listed below for reference.     Microbiology: No results found for this or any previous visit (from the past 240 hour(s)).   Labs:  CBC: Recent Labs  Lab 11/30/21 1620 11/30/21 1633 12/03/21 0438 12/04/21 0817 12/05/21 0447 12/05/21 1002 12/06/21 0655  WBC 9.1   < > 8.2 7.5 5.4 5.8 4.9  NEUTROABS 7.7  --   --   --   --   --   --   HGB 12.8   < > 9.3* 7.8* 6.9* 7.3* 8.2*  HCT 39.5   < > 28.9* 24.6* 21.3* 22.3* 24.2*  MCV 91.4   < > 93.5 93.2 92.2 92.1 90.0  PLT 308   < > 206 191 173 173 175   < > = values in this interval not displayed.   BMP &GFR Recent Labs  Lab 12/02/21 0637 12/03/21 0438 12/04/21 0817 12/05/21 0447 12/06/21 0655  NA 139 141 136 138 137  K 4.2 4.3 3.6 4.0 3.6  CL 111 113* 107 108 105  CO2 23 21* '24 26 27  '$ GLUCOSE 98 110* 102* 95 92  BUN 20 31* 25* 13 11  CREATININE 1.09*  1.59* 1.23* 0.89 0.86  CALCIUM 7.6* 7.3* 7.3* 7.6* 8.2*  MG 2.0 2.1 1.7 1.5* 1.6*  PHOS 2.3* 3.4 <1.0* 2.2* 3.0  3.0   Estimated Creatinine Clearance: 35 mL/min (  by C-G formula based on SCr of 0.86 mg/dL). Liver & Pancreas: Recent Labs  Lab 11/30/21 1620 12/01/21 0438 12/02/21 4431 12/03/21 0438 12/04/21 0817 12/05/21 0447 12/06/21 0655  AST '21 15 16  '$ --  17 15  --   ALT '11 9 6  '$ --  8 7  --   ALKPHOS 57 40 31*  --  36* 39  --   BILITOT 1.2 1.1 0.8  --  0.7 0.7  --   PROT 7.6 5.6* 4.7*  --  4.5* 4.3*  --   ALBUMIN 4.5 3.3* 2.5* 2.1* 2.1* 2.0* 2.1*   No results for input(s): "LIPASE", "AMYLASE" in the last 168 hours. No results for input(s): "AMMONIA" in the last 168 hours. Diabetic: No results for input(s): "HGBA1C" in the last 72 hours. No results for input(s): "GLUCAP" in the last 168 hours. Cardiac Enzymes: No results for input(s): "CKTOTAL", "CKMB", "CKMBINDEX", "TROPONINI" in the last 168 hours. No results for input(s): "PROBNP" in the last 8760 hours. Coagulation Profile: No results for input(s): "INR", "PROTIME" in the last 168 hours. Thyroid Function Tests: No results for input(s): "TSH", "T4TOTAL", "FREET4", "T3FREE", "THYROIDAB" in the last 72 hours. Lipid Profile: No results for input(s): "CHOL", "HDL", "LDLCALC", "TRIG", "CHOLHDL", "LDLDIRECT" in the last 72 hours. Anemia Panel: No results for input(s): "VITAMINB12", "FOLATE", "FERRITIN", "TIBC", "IRON", "RETICCTPCT" in the last 72 hours. Urine analysis: No results found for: "COLORURINE", "APPEARANCEUR", "LABSPEC", "PHURINE", "GLUCOSEU", "HGBUR", "BILIRUBINUR", "KETONESUR", "PROTEINUR", "UROBILINOGEN", "NITRITE", "LEUKOCYTESUR" Sepsis Labs: Invalid input(s): "PROCALCITONIN", "LACTICIDVEN"   SIGNED:  Mercy Riding, MD  [facility] 12/06/2021, 4:34 PM

## 2021-12-06 NOTE — Evaluation (Signed)
Occupational Therapy Evaluation Patient Details Name: Robin [name] MRN: [id number] DOB: [birth year] Today's Date: 12/06/2021   History of Present Illness 81 y.o. F admitted on 11/30/21 due to worsening pain at the R inguinal hernia site. pt underwent femoral hernia repair on 12/01/21. PMH significant of hypertension, osteoporosis, vitamin D deficiency, hyperlipidemia.   Clinical Impression   Pt admitted for concerns listed above. PTA pt reported that she was independent with all ADL's and IADL's. At this time, pt presents with increased weakness and balance deficits, requiring min guard. She has support at home from family and will not require further skilled OT at this time. Acute OT will sign off.       Recommendations for follow up therapy are one component of a multi-disciplinary discharge planning process, led by the attending physician.  Recommendations may be updated based on patient status, additional functional criteria and insurance authorization.   Follow Up Recommendations  No OT follow up    Assistance Recommended at Discharge Set up Supervision/Assistance  Patient can return home with the following A little help with bathing/dressing/bathroom;Assistance with cooking/housework    Functional Status Assessment  Patient has had a recent decline in their functional status and demonstrates the ability to make significant improvements in function in a reasonable and predictable amount of time.  Equipment Recommendations  None recommended by OT    Recommendations for Other Services       Precautions / Restrictions Precautions Precautions: Fall Restrictions Weight Bearing Restrictions: No      Mobility Bed Mobility Overal bed mobility: Modified Independent                  Transfers Overall transfer level: Needs assistance Equipment used: None Transfers: Sit to/from Stand Sit to Stand: Min guard           General transfer comment: min guard for  increased time and safety      Balance Overall balance assessment: Needs assistance Sitting-balance support: No upper extremity supported, Feet supported Sitting balance-Leahy Scale: Good     Standing balance support: No upper extremity supported Standing balance-Leahy Scale: Fair Standing balance comment: fair without RW, good with RW                           ADL either performed or assessed with clinical judgement   ADL Overall ADL's : Needs assistance/impaired                                       General ADL Comments: Min guard overall for safety, moves slowly to complete tasks.     Vision Baseline Vision/History: 0 No visual deficits Ability to See in Adequate Light: 0 Adequate Patient Visual Report: No change from baseline Vision Assessment?: No apparent visual deficits     Perception     Praxis      Pertinent Vitals/Pain Pain Assessment Pain Assessment: No/denies pain     Hand Dominance Right   Extremity/Trunk Assessment Upper Extremity Assessment Upper Extremity Assessment: Overall WFL for tasks assessed   Lower Extremity Assessment Lower Extremity Assessment: Defer to PT evaluation   Cervical / Trunk Assessment Cervical / Trunk Assessment: Normal   Communication Communication Communication: No difficulties   Cognition Arousal/Alertness: Awake/alert Behavior During Therapy: WFL for tasks assessed/performed Overall Cognitive Status: Within Functional Limits for tasks assessed  General Comments  VSS on RA    Exercises     Shoulder Instructions      Home Living Family/patient expects to be discharged to:: Private residence Living Arrangements: Children Available Help at Discharge: Family;Neighbor;Available 24 hours/day Type of Home: Apartment Home Access: Elevator     Home Layout: One level     Bathroom Shower/Tub: Teacher, early years/pre:  Standard     Home Equipment: Hand held shower head;Shower seat;Grab bars - tub/shower          Prior Functioning/Environment Prior Level of Function : Independent/Modified Independent             Mobility Comments: 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, drives ADLs Comments: independent, son can assist with iADLs        OT Problem List: Decreased strength;Decreased activity tolerance;Impaired balance (sitting and/or standing)      OT Treatment/Interventions:      OT Goals(Current goals can be found in the care plan section) Acute Rehab OT Goals Patient Stated Goal: To go home OT Goal Formulation: With patient Time For Goal Achievement: 12/06/21 Potential to Achieve Goals: Good  OT Frequency:      Co-evaluation              AM-PAC OT "6 Clicks" Daily Activity     Outcome Measure Help from another person eating meals?: None Help from another person taking care of personal grooming?: A Little Help from another person toileting, which includes using toliet, bedpan, or urinal?: A Little Help from another person bathing (including washing, rinsing, drying)?: A Little Help from another person to put on and taking off regular upper body clothing?: None Help from another person to put on and taking off regular lower body clothing?: A Little 6 Click Score: 20   End of Session Equipment Utilized During Treatment: Gait belt Nurse Communication: Mobility status  Activity Tolerance: Patient tolerated treatment well Patient left: in bed;with call bell/phone within reach  OT Visit Diagnosis: Unsteadiness on feet (R26.81);Other abnormalities of gait and mobility (R26.89);Muscle weakness (generalized) (M62.81)                Time: 0086-7619 OT Time Calculation (min): 12 min Charges:  OT General Charges $OT Visit: 1 Visit OT Evaluation $OT Eval Low Complexity: [place], OTR/L [place] 12/06/2021, 10:34 AM

## 2021-12-07 ENCOUNTER — Telehealth: Payer: Self-pay

## 2021-12-07 ENCOUNTER — Telehealth: Payer: Self-pay | Admitting: Nurse Practitioner

## 2021-12-07 DIAGNOSIS — N289 Disorder of kidney and ureter, unspecified: Secondary | ICD-10-CM | POA: Diagnosis not present

## 2021-12-07 DIAGNOSIS — H269 Unspecified cataract: Secondary | ICD-10-CM | POA: Diagnosis not present

## 2021-12-07 DIAGNOSIS — Z9181 History of falling: Secondary | ICD-10-CM | POA: Diagnosis not present

## 2021-12-07 DIAGNOSIS — Z87891 Personal history of nicotine dependence: Secondary | ICD-10-CM | POA: Diagnosis not present

## 2021-12-07 DIAGNOSIS — E782 Mixed hyperlipidemia: Secondary | ICD-10-CM | POA: Diagnosis not present

## 2021-12-07 DIAGNOSIS — Z48815 Encounter for surgical aftercare following surgery on the digestive system: Secondary | ICD-10-CM | POA: Diagnosis not present

## 2021-12-07 DIAGNOSIS — D519 Vitamin B12 deficiency anemia, unspecified: Secondary | ICD-10-CM | POA: Diagnosis not present

## 2021-12-07 DIAGNOSIS — E559 Vitamin D deficiency, unspecified: Secondary | ICD-10-CM | POA: Diagnosis not present

## 2021-12-07 DIAGNOSIS — I10 Essential (primary) hypertension: Secondary | ICD-10-CM | POA: Diagnosis not present

## 2021-12-07 DIAGNOSIS — D509 Iron deficiency anemia, unspecified: Secondary | ICD-10-CM | POA: Diagnosis not present

## 2021-12-07 DIAGNOSIS — Z86018 Personal history of other benign neoplasm: Secondary | ICD-10-CM | POA: Diagnosis not present

## 2021-12-07 DIAGNOSIS — M810 Age-related osteoporosis without current pathological fracture: Secondary | ICD-10-CM | POA: Diagnosis not present

## 2021-12-07 DIAGNOSIS — K4130 Unilateral femoral hernia, with obstruction, without gangrene, not specified as recurrent: Secondary | ICD-10-CM | POA: Diagnosis not present

## 2021-12-07 DIAGNOSIS — I4891 Unspecified atrial fibrillation: Secondary | ICD-10-CM | POA: Diagnosis not present

## 2021-12-07 DIAGNOSIS — R627 Adult failure to thrive: Secondary | ICD-10-CM | POA: Diagnosis not present

## 2021-12-07 DIAGNOSIS — Z7982 Long term (current) use of aspirin: Secondary | ICD-10-CM | POA: Diagnosis not present

## 2021-12-07 NOTE — Patient Outreach (Signed)
  Care Coordination TOC Note Transition Care Management Follow-up Telephone Call Date of discharge and from where: [facility] 11/30/21-12/06/21 How have you been since you were released from the hospital? "I am feeling pretty well, am not having any pain". Any questions or concerns? No  Items Reviewed: Did the pt receive and understand the discharge instructions provided? Yes  Medications obtained and verified? Yes  Other? No  Any new allergies since your discharge? No  Dietary orders reviewed? Yes Do you have support at home? Yes   Home Care and Equipment/Supplies: Were home health services ordered? yes If so, what is the name of the agency? [facility]  Has the agency set up a time to come to the patient's home? Yes Were any new equipment or medical supplies ordered?  No What is the name of the medical supply agency? N/A Were you able to get the supplies/equipment? N/A Do you have any questions related to the use of the equipment or supplies? No  Functional Questionnaire: (I = Independent and D = Dependent) ADLs: I  Bathing/Dressing- I  Meal Prep- Needs assistance  Eating- I  Maintaining continence- I  Transferring/Ambulation- I  Managing Meds- I  Follow up appointments reviewed:  PCP Hospital f/u appt confirmed? Yes  Scheduled to see Dr. Hassell Done on 12/14/21 @ 11:45. [facility] f/u appt confirmed? Yes  Scheduled to see Blake Divine, surgery on 12/16/21 @ 1:00. Are transportation arrangements needed? No  If their condition worsens, is the pt aware to call PCP or go to the Emergency Dept.? Yes Was the patient provided with contact information for the PCP's office or ED? Yes Was to pt encouraged to call back with questions or concerns? Yes  SDOH assessments and interventions completed:   Yes  Care Coordination Interventions Activated:  Yes   Care Coordination Interventions:  No Care Coordination interventions needed at this time.   Encounter  Outcome:  Pt. Visit Completed

## 2021-12-07 NOTE — Telephone Encounter (Signed)
[place] called to let MMM know that patients start of care for [facility] started today, 12/07/2021.

## 2021-12-14 ENCOUNTER — Encounter: Payer: Self-pay | Admitting: Nurse Practitioner

## 2021-12-14 ENCOUNTER — Ambulatory Visit (INDEPENDENT_AMBULATORY_CARE_PROVIDER_SITE_OTHER): Payer: Medicare Other | Admitting: Nurse Practitioner

## 2021-12-14 VITALS — BP 117/68 | HR 69 | Temp 97.9°F | Resp 20 | Ht 63.0 in | Wt 99.0 lb

## 2021-12-14 DIAGNOSIS — Z9889 Other specified postprocedural states: Secondary | ICD-10-CM

## 2021-12-14 DIAGNOSIS — K56609 Unspecified intestinal obstruction, unspecified as to partial versus complete obstruction: Secondary | ICD-10-CM

## 2021-12-14 DIAGNOSIS — Z7689 Persons encountering health services in other specified circumstances: Secondary | ICD-10-CM

## 2021-12-14 DIAGNOSIS — Z8719 Personal history of other diseases of the digestive system: Secondary | ICD-10-CM | POA: Diagnosis not present

## 2021-12-14 NOTE — Progress Notes (Signed)
   Subjective:    Patient ID: Robin [name], female    DOB: 15-Sep-1940, 81 y.o.   MRN: [id number]   Chief Complaint: Hospitalization Follow-up   HPI  Today's visit was for Transitional Care Management.  The patient was discharged from [facility] on 1010/23 with a primary diagnosis of incarcerated inguinal hernia.   Contact with the patient and/or caregiver, by a clinical staff member, was made on 12/07/21 and was documented as a telephone encounter within the EMR.  Through chart review and discussion with the patient I have determined that management of their condition is of moderate complexity.      Patient went to the ED with right groin pain and ended up having an incarcerated hernia. She had to have emergency surgery on 12/01/21. She is doing well. Denies any pain. She is still fatigued but otherwise is doing well.    Review of Systems  Constitutional:  Negative for diaphoresis.  Eyes:  Negative for pain.  Respiratory:  Negative for shortness of breath.   Cardiovascular:  Positive for leg swelling. Negative for chest pain and palpitations.  Gastrointestinal:  Negative for abdominal pain.  Endocrine: Negative for polydipsia.  Skin:  Negative for rash.  Neurological:  Negative for dizziness, weakness and headaches.  Hematological:  Does not bruise/bleed easily.  All other systems reviewed and are negative.      Objective:   Physical Exam Constitutional:      Appearance: Normal appearance.  Cardiovascular:     Rate and Rhythm: Normal rate and regular rhythm.     Heart sounds: Normal heart sounds.  Pulmonary:     Effort: Pulmonary effort is normal.     Breath sounds: Normal breath sounds.  Skin:    General: Skin is warm.     Comments: Right groin incision- edges well approximated- no erythema or edema  Neurological:     General: No focal deficit present.     Mental Status: She is alert and oriented to person, place, and time.  Psychiatric:        Mood and  Affect: Mood normal.        Behavior: Behavior normal.    BP 117/68   Pulse 69   Temp 97.9 F (36.6 C) (Temporal)   Resp 20   Ht '5\' 3"'$  (1.6 m)   Wt 99 lb (44.9 kg)   SpO2 96%   BMI 17.54 kg/m         Assessment & Plan:   Robin [name] in today with chief complaint of Hospitalization Follow-up   1. Encounter for support and coordination of transition of care Hospital records reviewed  2. S/P inguinal hernia repair Report any problems Keep follow up appointment with surgeon    The above assessment and management plan was discussed with the patient. The patient verbalized understanding of and has agreed to the management plan. Patient is aware to call the clinic if symptoms persist or worsen. Patient is aware when to return to the clinic for a follow-up visit. Patient educated on when it is appropriate to go to the emergency department.   [name] Done, FNP

## 2021-12-14 NOTE — Patient Instructions (Signed)

## 2021-12-16 ENCOUNTER — Encounter: Payer: Medicare Other | Admitting: General Surgery

## 2021-12-16 DIAGNOSIS — Z87891 Personal history of nicotine dependence: Secondary | ICD-10-CM | POA: Diagnosis not present

## 2021-12-16 DIAGNOSIS — H269 Unspecified cataract: Secondary | ICD-10-CM | POA: Diagnosis not present

## 2021-12-16 DIAGNOSIS — N289 Disorder of kidney and ureter, unspecified: Secondary | ICD-10-CM | POA: Diagnosis not present

## 2021-12-16 DIAGNOSIS — Z86018 Personal history of other benign neoplasm: Secondary | ICD-10-CM | POA: Diagnosis not present

## 2021-12-16 DIAGNOSIS — D519 Vitamin B12 deficiency anemia, unspecified: Secondary | ICD-10-CM | POA: Diagnosis not present

## 2021-12-16 DIAGNOSIS — Z48815 Encounter for surgical aftercare following surgery on the digestive system: Secondary | ICD-10-CM | POA: Diagnosis not present

## 2021-12-16 DIAGNOSIS — D509 Iron deficiency anemia, unspecified: Secondary | ICD-10-CM | POA: Diagnosis not present

## 2021-12-16 DIAGNOSIS — E559 Vitamin D deficiency, unspecified: Secondary | ICD-10-CM | POA: Diagnosis not present

## 2021-12-16 DIAGNOSIS — I10 Essential (primary) hypertension: Secondary | ICD-10-CM | POA: Diagnosis not present

## 2021-12-16 DIAGNOSIS — M810 Age-related osteoporosis without current pathological fracture: Secondary | ICD-10-CM | POA: Diagnosis not present

## 2021-12-16 DIAGNOSIS — K4130 Unilateral femoral hernia, with obstruction, without gangrene, not specified as recurrent: Secondary | ICD-10-CM | POA: Diagnosis not present

## 2021-12-16 DIAGNOSIS — I4891 Unspecified atrial fibrillation: Secondary | ICD-10-CM | POA: Diagnosis not present

## 2021-12-16 DIAGNOSIS — Z7982 Long term (current) use of aspirin: Secondary | ICD-10-CM | POA: Diagnosis not present

## 2021-12-16 DIAGNOSIS — Z9181 History of falling: Secondary | ICD-10-CM | POA: Diagnosis not present

## 2021-12-16 DIAGNOSIS — E782 Mixed hyperlipidemia: Secondary | ICD-10-CM | POA: Diagnosis not present

## 2021-12-16 DIAGNOSIS — R627 Adult failure to thrive: Secondary | ICD-10-CM | POA: Diagnosis not present

## 2021-12-17 ENCOUNTER — Telehealth: Payer: Self-pay | Admitting: *Deleted

## 2021-12-17 NOTE — Telephone Encounter (Signed)
Call placed to [place] [phone]~ telephone.   Gave VO for [facility] SN for post hospitalization management, post surgical management and staple removal to Thurmond Butts, [facility], [place] Licensed conveyancer.   Thurmond Butts reports that the earliest [facility] SN would be able to visit patient to remove staples is Monday, 12/20/2021.   Call placed to patient and patient made aware.   Dr. Constance Haw to be made aware.

## 2021-12-17 NOTE — Telephone Encounter (Signed)
Surgical Date: 12/01/2021 Procedure: SBO Resection/ Femoral Hernia Repair  Received call from patient [phone]~ telephone.   Reports that she needs to change post op appointment as she missed transportation. Patient uses [facility] transportation which requires 3 business days for scheduling. Appointment scheduled for 12/22/2021.   Noted that patient requires staple removal.   Patient currently under care of [place] [phone]~ telephone. Patient receives [facility] PT services.   Call placed to [place] to add [facility] SN services. [place] for [place], [facility] RN case Freight forwarder.

## 2021-12-19 DIAGNOSIS — Z87891 Personal history of nicotine dependence: Secondary | ICD-10-CM | POA: Diagnosis not present

## 2021-12-19 DIAGNOSIS — M810 Age-related osteoporosis without current pathological fracture: Secondary | ICD-10-CM | POA: Diagnosis not present

## 2021-12-19 DIAGNOSIS — Z7982 Long term (current) use of aspirin: Secondary | ICD-10-CM | POA: Diagnosis not present

## 2021-12-19 DIAGNOSIS — E782 Mixed hyperlipidemia: Secondary | ICD-10-CM | POA: Diagnosis not present

## 2021-12-19 DIAGNOSIS — R627 Adult failure to thrive: Secondary | ICD-10-CM | POA: Diagnosis not present

## 2021-12-19 DIAGNOSIS — D519 Vitamin B12 deficiency anemia, unspecified: Secondary | ICD-10-CM | POA: Diagnosis not present

## 2021-12-19 DIAGNOSIS — E559 Vitamin D deficiency, unspecified: Secondary | ICD-10-CM | POA: Diagnosis not present

## 2021-12-19 DIAGNOSIS — Z48815 Encounter for surgical aftercare following surgery on the digestive system: Secondary | ICD-10-CM | POA: Diagnosis not present

## 2021-12-19 DIAGNOSIS — I4891 Unspecified atrial fibrillation: Secondary | ICD-10-CM | POA: Diagnosis not present

## 2021-12-19 DIAGNOSIS — N289 Disorder of kidney and ureter, unspecified: Secondary | ICD-10-CM | POA: Diagnosis not present

## 2021-12-19 DIAGNOSIS — Z86018 Personal history of other benign neoplasm: Secondary | ICD-10-CM | POA: Diagnosis not present

## 2021-12-19 DIAGNOSIS — K4130 Unilateral femoral hernia, with obstruction, without gangrene, not specified as recurrent: Secondary | ICD-10-CM | POA: Diagnosis not present

## 2021-12-19 DIAGNOSIS — D509 Iron deficiency anemia, unspecified: Secondary | ICD-10-CM | POA: Diagnosis not present

## 2021-12-19 DIAGNOSIS — I10 Essential (primary) hypertension: Secondary | ICD-10-CM | POA: Diagnosis not present

## 2021-12-19 DIAGNOSIS — Z9181 History of falling: Secondary | ICD-10-CM | POA: Diagnosis not present

## 2021-12-19 DIAGNOSIS — H269 Unspecified cataract: Secondary | ICD-10-CM | POA: Diagnosis not present

## 2021-12-20 ENCOUNTER — Ambulatory Visit (INDEPENDENT_AMBULATORY_CARE_PROVIDER_SITE_OTHER): Payer: Medicare Other | Admitting: Nurse Practitioner

## 2021-12-20 ENCOUNTER — Telehealth: Payer: Self-pay | Admitting: Nurse Practitioner

## 2021-12-20 ENCOUNTER — Encounter: Payer: Self-pay | Admitting: Nurse Practitioner

## 2021-12-20 VITALS — BP 127/77 | HR 96 | Temp 97.9°F | Resp 20 | Ht 63.0 in | Wt 94.0 lb

## 2021-12-20 DIAGNOSIS — I10 Essential (primary) hypertension: Secondary | ICD-10-CM | POA: Diagnosis not present

## 2021-12-20 DIAGNOSIS — M810 Age-related osteoporosis without current pathological fracture: Secondary | ICD-10-CM

## 2021-12-20 DIAGNOSIS — R636 Underweight: Secondary | ICD-10-CM

## 2021-12-20 DIAGNOSIS — E782 Mixed hyperlipidemia: Secondary | ICD-10-CM | POA: Diagnosis not present

## 2021-12-20 DIAGNOSIS — E559 Vitamin D deficiency, unspecified: Secondary | ICD-10-CM | POA: Diagnosis not present

## 2021-12-20 DIAGNOSIS — F411 Generalized anxiety disorder: Secondary | ICD-10-CM

## 2021-12-20 DIAGNOSIS — E875 Hyperkalemia: Secondary | ICD-10-CM | POA: Diagnosis not present

## 2021-12-20 DIAGNOSIS — R531 Weakness: Secondary | ICD-10-CM

## 2021-12-20 MED ORDER — ATORVASTATIN CALCIUM 80 MG PO TABS: 80.0000 mg | ORAL_TABLET | Freq: Every day | ORAL | 1 refills | Status: DC

## 2021-12-20 MED ORDER — ESCITALOPRAM OXALATE 10 MG PO TABS: 10.0000 mg | ORAL_TABLET | Freq: Every day | ORAL | 1 refills | Status: DC

## 2021-12-20 MED ORDER — LISINOPRIL 10 MG PO TABS: 10.0000 mg | ORAL_TABLET | Freq: Every day | ORAL | 1 refills | Status: DC

## 2021-12-20 MED ORDER — FERROUS SULFATE 325 (65 FE) MG PO TBEC: 325.0000 mg | DELAYED_RELEASE_TABLET | Freq: Two times a day (BID) | ORAL | 1 refills | Status: DC

## 2021-12-20 NOTE — Telephone Encounter (Signed)
RTC given VO for HH frequency

## 2021-12-20 NOTE — Progress Notes (Addendum)
Subjective:    Patient ID: Robin [name], female    DOB: 02-21-1941, 81 y.o.   MRN: [id number]  Robin [name] comes in today with chief complaint of Medical Management of Chronic Issues   HPI : 1. Essential hypertension No c/o chest pain, sob or headache. Does not check blood pressure at  home. BP Readings from Last 3 Encounters:  12/20/21 127/77  12/14/21 117/68  12/06/21 125/60     2. Hyperkalemia No c/o of muscle cramps Lab Results  Component Value Date   K 3.6 12/06/2021     3. Hypercalcemia No issues that she is aware of. Lab Results  Component Value Date   CALCIUM 8.2 (L) 12/06/2021   PHOS 3.0 12/06/2021   PHOS 3.0 12/06/2021     4. Mixed hyperlipidemia Doe snot have a very good appetite. Is on lipitor. Has a very poor appetite Lab Results  Component Value Date   CHOL 238 (H) 12/21/2020   HDL 47 12/21/2020   LDLCALC 168 (H) 12/21/2020   TRIG 129 12/21/2020   CHOLHDL 5.1 (H) 12/21/2020     5. Vitamin D insufficiency Is on a daily vitamin d supplement Last vitamin D Lab Results  Component Value Date   VD25OH 52.65 12/02/2021     6. GAD (generalized anxiety disorder) Is on lexapro daily in which she says helps    12/20/2021    1:51 PM 12/14/2021   11:50 AM 06/18/2021    2:44 PM 02/04/2021    9:26 AM  GAD 7 : Generalized Anxiety Score  Nervous, Anxious, on Edge 0 0 0 0  Control/stop worrying 0 0 0 0  Worry too much - different things 0 0 1 0  Trouble relaxing 0 0 0 0  Restless 0 0 0 0  Easily annoyed or irritable 0 0 0 0  Afraid - awful might happen 0 0 0 0  Total GAD 7 Score 0 0 1 0  Anxiety Difficulty Not difficult at all Not difficult at all Somewhat difficult Not difficult at all      7. Age related osteoporosis, unspecified pathological fracture presence She is on prolia injections. Her last dexascan was done on 02/04/21.  T score was -3.2  8. Underweight Wt Readings from Last 3 Encounters:  12/20/21 94 lb (42.6 kg)   12/14/21 99 lb (44.9 kg)  11/30/21 93 lb 11.1 oz (42.5 kg)   BMI Readings from Last 3 Encounters:  12/20/21 16.65 kg/m  12/14/21 17.54 kg/m  11/30/21 16.60 kg/m    Complaint: Patient is needing help with bathing, laundry and light house work. Sh ehas become very weak an unable to do.  Outpatient Encounter Medications as of 12/20/2021  Medication Sig   aspirin EC 81 MG tablet Take 81 mg by mouth daily.   atorvastatin (LIPITOR) 80 MG tablet Take 1 tablet (80 mg total) by mouth daily.   Cholecalciferol (VITAMIN D) 2000 UNITS tablet Take 4000IU on saturdays and sundays and 2000IU all other days. (Patient taking differently: Take 2,000 Units by mouth See admin instructions. Take 1 tablet (2000 units) on Mondays through Fridays and take 2 tablets (4000 units) by mouth on Saturdays & Sundays.)   cyanocobalamin 1000 MCG tablet Take 1 tablet (1,000 mcg total) by mouth daily.   denosumab (PROLIA) 60 MG/ML SOSY injection BRING TO OFFICE FOR ADMINISTRATION. ADMINISTER IN UPPER ARM, THIGH OR ABDOMEN   ENSURE PLUS (ENSURE PLUS) LIQD Take 1 Can by mouth daily. Dx:  R63.6 (underweight)  escitalopram (LEXAPRO) 10 MG tablet Take 1 tablet (10 mg total) by mouth daily.   ferrous sulfate 325 (65 FE) MG EC tablet Take 1 tablet (325 mg total) by mouth 2 (two) times daily.   lisinopril (ZESTRIL) 10 MG tablet Take 1 tablet (10 mg total) by mouth daily.   senna-docusate (SENOKOT-S) 8.6-50 MG tablet Take 1 tablet by mouth 2 (two) times daily between meals as needed for moderate constipation.   No facility-administered encounter medications on file as of 12/20/2021.       Review of Systems  Constitutional:  Negative for diaphoresis.  Eyes:  Negative for pain.  Respiratory:  Negative for shortness of breath.   Cardiovascular:  Negative for chest pain, palpitations and leg swelling.  Gastrointestinal:  Negative for abdominal pain.  Endocrine: Negative for polydipsia.  Skin:  Negative for rash.   Neurological:  Negative for dizziness, weakness and headaches.  Hematological:  Does not bruise/bleed easily.  All other systems reviewed and are negative.      Objective:   Physical Exam Vitals and nursing note reviewed.  Constitutional:      General: She is not in acute distress.    Appearance: Normal appearance. She is well-developed.  HENT:     Head: Normocephalic.     Right Ear: Tympanic membrane normal.     Left Ear: Tympanic membrane normal.     Nose: Nose normal.     Mouth/Throat:     Mouth: Mucous membranes are moist.  Eyes:     Pupils: Pupils are equal, round, and reactive to light.  Neck:     Vascular: No carotid bruit or JVD.  Cardiovascular:     Rate and Rhythm: Normal rate and regular rhythm.     Heart sounds: Normal heart sounds.  Pulmonary:     Effort: Pulmonary effort is normal. No respiratory distress.     Breath sounds: Normal breath sounds. No wheezing or rales.  Chest:     Chest wall: No tenderness.  Abdominal:     General: Bowel sounds are normal. There is no distension or abdominal bruit.     Palpations: Abdomen is soft. There is no hepatomegaly, splenomegaly, mass or pulsatile mass.     Tenderness: There is no abdominal tenderness.  Musculoskeletal:        General: Normal range of motion.     Cervical back: Normal range of motion and neck supple.     Comments: Ambulating with walker- slow and steady  Lymphadenopathy:     Cervical: No cervical adenopathy.  Skin:    General: Skin is warm and dry.     Comments: Right inguinal incsion- wound edges well approximate- no erythema or drainage  Neurological:     Mental Status: She is alert and oriented to person, place, and time.     Deep Tendon Reflexes: Reflexes are normal and symmetric.  Psychiatric:        Behavior: Behavior normal.        Thought Content: Thought content normal.        Judgment: Judgment normal.    BP 127/77   Pulse 96   Temp 97.9 F (36.6 C) (Temporal)   Resp 20   Ht 5'  3" (1.6 m)   Wt 94 lb (42.6 kg)   SpO2 97%   BMI 16.65 kg/m   22 staples removed from right groin area      Assessment & Plan:   Robin [name] comes in today with chief complaint of  Medical Management of Chronic Issues   Diagnosis and orders addressed:  1. Essential hypertension Low sodium diet - lisinopril (ZESTRIL) 10 MG tablet; Take 1 tablet (10 mg total) by mouth daily.  Dispense: 90 tablet; Refill: 1 - CBC with Differential/Platelet - CMP14+EGFR  2. Hyperkalemia Labs pending  3. Hypercalcemia Labs pending  4. Mixed hyperlipidemia Low fta diet - atorvastatin (LIPITOR) 80 MG tablet; Take 1 tablet (80 mg total) by mouth daily.  Dispense: 90 tablet; Refill: 1 - Lipid panel  5. Vitamin D insufficiency Continue daily vitamin d supplement  6. GAD (generalized anxiety disorder) Stress management - escitalopram (LEXAPRO) 10 MG tablet; Take 1 tablet (10 mg total) by mouth daily.  Dispense: 90 tablet; Refill: 1  7. Age related osteoporosis, unspecified pathological fracture presence Continue prolia  8. Underweight Increase calories in diet  9. Weakness Ref HH for helps with ADLS - Ambulatory referral to Home Health  10. Staple removal No special wound care needed  Labs pending Health Maintenance reviewed Diet and exercise encouraged  Follow up plan: 6 months   [place], FNP

## 2021-12-20 NOTE — Patient Instructions (Signed)

## 2021-12-22 ENCOUNTER — Ambulatory Visit (INDEPENDENT_AMBULATORY_CARE_PROVIDER_SITE_OTHER): Payer: Medicare Other | Admitting: General Surgery

## 2021-12-22 DIAGNOSIS — D509 Iron deficiency anemia, unspecified: Secondary | ICD-10-CM | POA: Diagnosis not present

## 2021-12-22 DIAGNOSIS — H269 Unspecified cataract: Secondary | ICD-10-CM | POA: Diagnosis not present

## 2021-12-22 DIAGNOSIS — Z48815 Encounter for surgical aftercare following surgery on the digestive system: Secondary | ICD-10-CM | POA: Diagnosis not present

## 2021-12-22 DIAGNOSIS — Z87891 Personal history of nicotine dependence: Secondary | ICD-10-CM | POA: Diagnosis not present

## 2021-12-22 DIAGNOSIS — M810 Age-related osteoporosis without current pathological fracture: Secondary | ICD-10-CM | POA: Diagnosis not present

## 2021-12-22 DIAGNOSIS — I10 Essential (primary) hypertension: Secondary | ICD-10-CM | POA: Diagnosis not present

## 2021-12-22 DIAGNOSIS — R627 Adult failure to thrive: Secondary | ICD-10-CM | POA: Diagnosis not present

## 2021-12-22 DIAGNOSIS — D519 Vitamin B12 deficiency anemia, unspecified: Secondary | ICD-10-CM | POA: Diagnosis not present

## 2021-12-22 DIAGNOSIS — K56609 Unspecified intestinal obstruction, unspecified as to partial versus complete obstruction: Secondary | ICD-10-CM

## 2021-12-22 DIAGNOSIS — Z86018 Personal history of other benign neoplasm: Secondary | ICD-10-CM | POA: Diagnosis not present

## 2021-12-22 DIAGNOSIS — Z7982 Long term (current) use of aspirin: Secondary | ICD-10-CM | POA: Diagnosis not present

## 2021-12-22 DIAGNOSIS — K4130 Unilateral femoral hernia, with obstruction, without gangrene, not specified as recurrent: Secondary | ICD-10-CM | POA: Diagnosis not present

## 2021-12-22 DIAGNOSIS — Z9181 History of falling: Secondary | ICD-10-CM | POA: Diagnosis not present

## 2021-12-22 DIAGNOSIS — N289 Disorder of kidney and ureter, unspecified: Secondary | ICD-10-CM | POA: Diagnosis not present

## 2021-12-22 DIAGNOSIS — E559 Vitamin D deficiency, unspecified: Secondary | ICD-10-CM | POA: Diagnosis not present

## 2021-12-22 DIAGNOSIS — E782 Mixed hyperlipidemia: Secondary | ICD-10-CM | POA: Diagnosis not present

## 2021-12-22 DIAGNOSIS — I4891 Unspecified atrial fibrillation: Secondary | ICD-10-CM | POA: Diagnosis not present

## 2021-12-22 DIAGNOSIS — K4190 Unilateral femoral hernia, without obstruction or gangrene, not specified as recurrent: Secondary | ICD-10-CM

## 2021-12-22 NOTE — Patient Instructions (Signed)
Monitor hte right inguinal region. Swelling should improve.

## 2021-12-22 NOTE — Progress Notes (Signed)
[facility]  Doing well. Eating and having Bms. Says just a little sore on the right groin region.    R inguinal region healing, no erythema or drainage, staples removed, swollen area especially superior on abdomen, no recurrence  Patient s/p femoral hernia repair with [place] and SBR. Doing well.   Keep stools regular No heavy lifting > 10 lbs, excessive bending, pushing, pulling, or squatting for 6-8 weeks after surgery.  Monitor swelling it should improve.    Future Appointments  Date Time Provider [place]  12/22/2021  3:30 PM Virl Cagey, MD [facility] None  12/27/2021 11:15 AM [facility]-ANNUAL WELLNESS VISIT [facility] None  01/25/2022 11:15 AM Virl Cagey, MD [facility] None  06/20/2022  2:00 PM Chevis Pretty, [place] [facility] None   Curlene Labrum, MD [facility] [address] [place], [place] [address] [phone] (office)

## 2021-12-24 ENCOUNTER — Ambulatory Visit (INDEPENDENT_AMBULATORY_CARE_PROVIDER_SITE_OTHER): Payer: Medicare Other

## 2021-12-24 DIAGNOSIS — I4891 Unspecified atrial fibrillation: Secondary | ICD-10-CM

## 2021-12-24 DIAGNOSIS — N289 Disorder of kidney and ureter, unspecified: Secondary | ICD-10-CM | POA: Diagnosis not present

## 2021-12-24 DIAGNOSIS — E782 Mixed hyperlipidemia: Secondary | ICD-10-CM | POA: Diagnosis not present

## 2021-12-24 DIAGNOSIS — M810 Age-related osteoporosis without current pathological fracture: Secondary | ICD-10-CM

## 2021-12-24 DIAGNOSIS — R627 Adult failure to thrive: Secondary | ICD-10-CM | POA: Diagnosis not present

## 2021-12-24 DIAGNOSIS — Z86018 Personal history of other benign neoplasm: Secondary | ICD-10-CM | POA: Diagnosis not present

## 2021-12-24 DIAGNOSIS — Z9181 History of falling: Secondary | ICD-10-CM | POA: Diagnosis not present

## 2021-12-24 DIAGNOSIS — H269 Unspecified cataract: Secondary | ICD-10-CM | POA: Diagnosis not present

## 2021-12-24 DIAGNOSIS — K4130 Unilateral femoral hernia, with obstruction, without gangrene, not specified as recurrent: Secondary | ICD-10-CM | POA: Diagnosis not present

## 2021-12-24 DIAGNOSIS — I10 Essential (primary) hypertension: Secondary | ICD-10-CM | POA: Diagnosis not present

## 2021-12-24 DIAGNOSIS — D509 Iron deficiency anemia, unspecified: Secondary | ICD-10-CM | POA: Diagnosis not present

## 2021-12-24 DIAGNOSIS — E559 Vitamin D deficiency, unspecified: Secondary | ICD-10-CM | POA: Diagnosis not present

## 2021-12-24 DIAGNOSIS — D519 Vitamin B12 deficiency anemia, unspecified: Secondary | ICD-10-CM

## 2021-12-24 DIAGNOSIS — Z87891 Personal history of nicotine dependence: Secondary | ICD-10-CM | POA: Diagnosis not present

## 2021-12-24 DIAGNOSIS — Z48815 Encounter for surgical aftercare following surgery on the digestive system: Secondary | ICD-10-CM | POA: Diagnosis not present

## 2021-12-24 DIAGNOSIS — Z7982 Long term (current) use of aspirin: Secondary | ICD-10-CM | POA: Diagnosis not present

## 2021-12-27 ENCOUNTER — Ambulatory Visit (INDEPENDENT_AMBULATORY_CARE_PROVIDER_SITE_OTHER): Payer: Medicare Other

## 2021-12-27 VITALS — Ht 62.0 in | Wt 99.0 lb

## 2021-12-27 DIAGNOSIS — Z0000 Encounter for general adult medical examination without abnormal findings: Secondary | ICD-10-CM

## 2021-12-27 NOTE — Patient Instructions (Signed)
Robin [name] , Thank you for taking time to come for your Medicare Wellness Visit. I appreciate your ongoing commitment to your health goals. Please review the following plan we discussed and let me know if I can assist you in the future.   These are the goals we discussed:  Goals      Develop Plan Of Care For Management of HTN     DIET - EAT MORE FRUITS AND VEGETABLES        This is a list of the screening recommended for you and due dates:  Health Maintenance  Topic Date Due   COVID-19 Vaccine (3 - Moderna series) 12/30/2021*   Zoster (Shingles) Vaccine (1 of 2) 03/16/2022*   Mammogram  01/20/2022   Medicare Annual Wellness Visit  12/28/2022   DEXA scan (bone density measurement)  02/05/2023   Tetanus Vaccine  05/29/2024   Colon Cancer Screening  04/22/2025   Pneumonia Vaccine  Completed   Flu Shot  Completed   HPV Vaccine  Aged Out  *Topic was postponed. The date shown is not the original due date.    Advanced directives: in Chart   Conditions/risks identified: Aim for 30 minutes of exercise or brisk walking, 6-8 glasses of water, and 5 servings of fruits and vegetables each day.   Next appointment: Follow up in one year for your annual wellness visit    Preventive Care 65 Years and Older, Female Preventive care refers to lifestyle choices and visits with your health care provider that can promote health and wellness. What does preventive care include? A yearly physical exam. This is also called an annual well check. Dental exams once or twice a year. Routine eye exams. Ask your health care provider how often you should have your eyes checked. Personal lifestyle choices, including: Daily care of your teeth and gums. Regular physical activity. Eating a healthy diet. Avoiding tobacco and drug use. Limiting alcohol use. Practicing safe sex. Taking low-dose aspirin every day. Taking vitamin and mineral supplements as recommended by your health care provider. What happens  during an annual well check? The services and screenings done by your health care provider during your annual well check will depend on your age, overall health, lifestyle risk factors, and family history of disease. Counseling  Your health care provider may ask you questions about your: Alcohol use. Tobacco use. Drug use. Emotional well-being. Home and relationship well-being. Sexual activity. Eating habits. History of falls. Memory and ability to understand (cognition). Work and work Statistician. Reproductive health. Screening  You may have the following tests or measurements: Height, weight, and BMI. Blood pressure. Lipid and cholesterol levels. These may be checked every 5 years, or more frequently if you are over 11 years old. Skin check. Lung cancer screening. You may have this screening every year starting at age 51 if you have a 30-pack-year history of smoking and currently smoke or have quit within the past 15 years. Fecal occult blood test (FOBT) of the stool. You may have this test every year starting at age 63. Flexible sigmoidoscopy or colonoscopy. You may have a sigmoidoscopy every 5 years or a colonoscopy every 10 years starting at age 98. Hepatitis C blood test. Hepatitis B blood test. Sexually transmitted disease (STD) testing. Diabetes screening. This is done by checking your blood sugar (glucose) after you have not eaten for a while (fasting). You may have this done every 1-3 years. Bone density scan. This is done to screen for osteoporosis. You may have this  done starting at age 95. Mammogram. This may be done every 1-2 years. Talk to your health care provider about how often you should have regular mammograms. Talk with your health care provider about your test results, treatment options, and if necessary, the need for more tests. Vaccines  Your health care provider may recommend certain vaccines, such as: Influenza vaccine. This is recommended every  year. Tetanus, diphtheria, and acellular pertussis (Tdap, Td) vaccine. You may need a Td booster every 10 years. Zoster vaccine. You may need this after age 20. Pneumococcal 13-valent conjugate (PCV13) vaccine. One dose is recommended after age 13. Pneumococcal polysaccharide (PPSV23) vaccine. One dose is recommended after age 81. Talk to your health care provider about which screenings and vaccines you need and how often you need them. This information is not intended to replace advice given to you by your health care provider. Make sure you discuss any questions you have with your health care provider. Document Released: 03/06/2015 Document Revised: 10/28/2015 Document Reviewed: 12/09/2014 Elsevier Interactive Patient Education  2017 [employer] Prevention in the Home Falls can cause injuries. They can happen to people of all ages. There are many things you can do to make your home safe and to help prevent falls. What can I do on the outside of my home? Regularly fix the edges of walkways and driveways and fix any cracks. Remove anything that might make you trip as you walk through a door, such as a raised step or threshold. Trim any bushes or trees on the path to your home. Use bright outdoor lighting. Clear any walking paths of anything that might make someone trip, such as rocks or tools. Regularly check to see if handrails are loose or broken. Make sure that both sides of any steps have handrails. Any raised decks and porches should have guardrails on the edges. Have any leaves, snow, or ice cleared regularly. Use sand or salt on walking paths during winter. Clean up any spills in your garage right away. This includes oil or grease spills. What can I do in the bathroom? Use night lights. Install grab bars by the toilet and in the tub and shower. Do not use towel bars as grab bars. Use non-skid mats or decals in the tub or shower. If you need to sit down in the shower, use a  plastic, non-slip stool. Keep the floor dry. Clean up any water that spills on the floor as soon as it happens. Remove soap buildup in the tub or shower regularly. Attach bath mats securely with double-sided non-slip rug tape. Do not have throw rugs and other things on the floor that can make you trip. What can I do in the bedroom? Use night lights. Make sure that you have a light by your bed that is easy to reach. Do not use any sheets or blankets that are too big for your bed. They should not hang down onto the floor. Have a firm chair that has side arms. You can use this for support while you get dressed. Do not have throw rugs and other things on the floor that can make you trip. What can I do in the kitchen? Clean up any spills right away. Avoid walking on wet floors. Keep items that you use a lot in easy-to-reach places. If you need to reach something above you, use a strong step stool that has a grab bar. Keep electrical cords out of the way. Do not use floor polish or wax  that makes floors slippery. If you must use wax, use non-skid floor wax. Do not have throw rugs and other things on the floor that can make you trip. What can I do with my stairs? Do not leave any items on the stairs. Make sure that there are handrails on both sides of the stairs and use them. Fix handrails that are broken or loose. Make sure that handrails are as long as the stairways. Check any carpeting to make sure that it is firmly attached to the stairs. Fix any carpet that is loose or worn. Avoid having throw rugs at the top or bottom of the stairs. If you do have throw rugs, attach them to the floor with carpet tape. Make sure that you have a light switch at the top of the stairs and the bottom of the stairs. If you do not have them, ask someone to add them for you. What else can I do to help prevent falls? Wear shoes that: Do not have high heels. Have rubber bottoms. Are comfortable and fit you  well. Are closed at the toe. Do not wear sandals. If you use a stepladder: Make sure that it is fully opened. Do not climb a closed stepladder. Make sure that both sides of the stepladder are locked into place. Ask someone to hold it for you, if possible. Clearly mark and make sure that you can see: Any grab bars or handrails. First and last steps. Where the edge of each step is. Use tools that help you move around (mobility aids) if they are needed. These include: Canes. Walkers. Scooters. Crutches. Turn on the lights when you go into a dark area. Replace any light bulbs as soon as they burn out. Set up your furniture so you have a clear path. Avoid moving your furniture around. If any of your floors are uneven, fix them. If there are any pets around you, be aware of where they are. Review your medicines with your doctor. Some medicines can make you feel dizzy. This can increase your chance of falling. Ask your doctor what other things that you can do to help prevent falls. This information is not intended to replace advice given to you by your health care provider. Make sure you discuss any questions you have with your health care provider. Document Released: 12/04/2008 Document Revised: 07/16/2015 Document Reviewed: 03/14/2014 Elsevier Interactive Patient Education  2017 [employer].

## 2021-12-27 NOTE — Progress Notes (Signed)
Subjective:   Robin [name] is a 81 y.o. female who presents for Medicare Annual (Subsequent) preventive examination.   I connected with  [name] on 12/27/21 by a audio enabled telemedicine application and verified that I am speaking with the correct person using two identifiers.  Patient Location: Home  Provider Location: Home Office  I discussed the limitations of evaluation and management by telemedicine. The patient expressed understanding and agreed to proceed.  Review of Systems     Cardiac Risk Factors include: advanced age (>54mn, >>31women)     Objective:    Today's Vitals   12/27/21 1136  Weight: 99 lb (44.9 kg)  Height: '5\' 2"'$  (1.575 m)   Body mass index is 18.11 kg/m.     12/27/2021   11:39 AM 12/01/2021    9:29 AM 11/30/2021   11:17 PM 11/30/2021   11:05 PM 11/30/2021    3:29 PM 07/05/2021   12:57 PM 12/01/2020   11:31 AM  Advanced Directives  Does Patient Have a Medical Advance Directive? Yes Yes Yes No;Yes Yes Yes Yes  Type of AParamedicof AMoclipsLiving will  HGemLiving will  Living will;Healthcare Power of [place]or[place]Living will  Does patient want to make changes to medical advance directive? No - Patient declined  No - Patient declined      Copy of H[place]achwoodin Chart? Yes - validated most recent copy scanned in chart (See row information)  No - copy requested    No - copy requested  Would patient like information on creating a medical advance directive? No - Patient declined No - Patient declined  No - Patient declined       Current Medications (verified) Outpatient Encounter Medications as of 12/27/2021  Medication Sig   aspirin EC 81 MG tablet Take 81 mg by mouth daily.   atorvastatin (LIPITOR) 80 MG tablet Take 1 tablet (80 mg total) by mouth daily.   Cholecalciferol (VITAMIN D) 2000 UNITS tablet Take 4000IU on saturdays and sundays and  2000IU all other days. (Patient taking differently: Take 2,000 Units by mouth See admin instructions. Take 1 tablet (2000 units) on Mondays through Fridays and take 2 tablets (4000 units) by mouth on Saturdays & Sundays.)   cyanocobalamin 1000 MCG tablet Take 1 tablet (1,000 mcg total) by mouth daily.   denosumab (PROLIA) 60 MG/ML SOSY injection BRING TO OFFICE FOR ADMINISTRATION. ADMINISTER IN UPPER ARM, THIGH OR ABDOMEN   ENSURE PLUS (ENSURE PLUS) LIQD Take 1 Can by mouth daily. Dx:  R63.6 (underweight)   escitalopram (LEXAPRO) 10 MG tablet Take 1 tablet (10 mg total) by mouth daily.   ferrous sulfate 325 (65 FE) MG EC tablet Take 1 tablet (325 mg total) by mouth 2 (two) times daily.   lisinopril (ZESTRIL) 10 MG tablet Take 1 tablet (10 mg total) by mouth daily.   senna-docusate (SENOKOT-S) 8.6-50 MG tablet Take 1 tablet by mouth 2 (two) times daily between meals as needed for moderate constipation.   No facility-administered encounter medications on file as of 12/27/2021.    Allergies (verified) Patient has no known allergies.   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 Sharlett Iles: 5cm circumferential fungating tumor at the hepatic flexure, path tubulovillous adenoma.    COLONOSCOPY  01/02/2006   Dr. Sharlett Iles: large spreading tubulovillous adenoma distal to  ileocolonic anastomosis. Multiple polyps around anastomosis of different sizes,    COLONOSCOPY  02/21/2006   Dr. Sharlett Iles. Referred to Dr. Georgette Dover for resection   COLONOSCOPY N/A 07/18/2014   Procedure: COLONOSCOPY;  Surgeon: Daneil Dolin, MD;  Location: [facility];  Service: Endoscopy;  Laterality: N/A;  1230   COLONOSCOPY WITH PROPOFOL N/A 04/22/2020   Procedure: COLONOSCOPY WITH PROPOFOL;  Surgeon: Rogene Houston, MD;  Location: [facility];  Service: Endoscopy;  Laterality: N/A;  am   EYE SURGERY     FEMORAL HERNIA REPAIR Right 12/01/2021   Procedure: HERNIA REPAIR FEMORAL, McVay Repair;  Surgeon: Virl Cagey, MD;  Location: [facility];  Service: General;  Laterality: Right;   LAPAROSCOPIC LYSIS OF ADHESIONS     PARTIAL COLECTOMY  02/21/2005   due to tubulovillous adenoma   TUBAL LIGATION     Family History  Problem Relation Age of Onset   Diabetes Mother    Heart disease Mother    Cancer Mother        patient unsure of type   Hip fracture Mother    Heart disease Father        antigioplasty   Cancer Sister        lung   COPD Sister    Heart disease Sister    Atrial fibrillation Sister    Heart disease Sister        stent in left leg   Varicose Veins Sister    Deep vein thrombosis Sister    Heart disease Brother    COPD Brother    COPD Brother    Huntington's disease Daughter    Carpal tunnel syndrome Daughter    Brain cancer Son    Colon cancer Neg Hx    Social History   Socioeconomic History   Marital status: Widowed    Spouse name: Not on file   Number of children: 6   Years of education: Not on file   Highest education level: Not on file  Occupational History   Occupation: retired  Tobacco Use   Smoking status: Former    Years: 1.00    Types: Cigarettes    Quit date: 11/01/1959    Years since quitting: 62.1   Smokeless tobacco: Never  Vaping Use   Vaping Use: Never used  Substance and Sexual Activity   Alcohol use: No    Alcohol/week: 0.0  standard drinks of alcohol   Drug use: No   Sexual activity: Never  Other Topics Concern   Not on file  Social History Narrative   Her son lives with her   Lives in apartment with elevator   Had 6 children - 3 sons, 3 daughters, one son passed at age 37   Social Determinants of Health   Financial Resource Strain: [place]  (12/27/2021)   Overall Financial Resource Strain ([place])  Difficulty of Paying Living Expenses: Not hard at all  Food Insecurity: No Food Insecurity (12/27/2021)   Hunger Vital Sign    Worried About Running Out of Food in the Last Year: Never true    Ran Out of Food in the Last Year: Never true  Transportation Needs: No Transportation Needs (12/27/2021)   PRAPARE - Hydrologist (Medical): No    Lack of Transportation (Non-Medical): No  Physical Activity: Insufficiently Active (12/27/2021)   Exercise Vital Sign    Days of Exercise per Week: 1 day    Minutes of Exercise per Session: 60 min  Stress: No Stress Concern Present (12/27/2021)   [place]    Feeling of Stress : Not at all  Social Connections: Moderately Isolated (12/27/2021)   Social Connection and Isolation Panel [NHANES]    Frequency of Communication with Friends and Family: More than three times a week    Frequency of Social Gatherings with Friends and Family: More than three times a week    Attends Religious Services: More than 4 times per year    Active Member of [employer] or Organizations: No    Attends Archivist Meetings: Never    Marital Status: Widowed    Tobacco Counseling Counseling given: Not Answered   Clinical Intake:  Pre-visit preparation completed: Yes  Pain : No/denies pain     Nutritional Risks: None Diabetes: No  How often do you need to have someone help you when you read instructions, pamphlets, or other written materials from your doctor or pharmacy?: 1 -  Never  Diabetic?no   Interpreter Needed?: No  Information entered by :: Jadene Pierini, LPN   Activities of Daily Living    12/27/2021   11:40 AM 11/30/2021   11:14 PM  In your present state of health, do you have any difficulty performing the following activities:  Hearing? 0   Vision? 0   Difficulty concentrating or making decisions? 0   Walking or climbing stairs? 1   Dressing or bathing? 1   Doing errands, shopping? 1 0  Preparing Food and eating ? Y   Using the Toilet? N   In the past six months, have you accidently leaked urine? Y   Do you have problems with loss of bowel control? N   Managing your Medications? N   Managing your Finances? N   Housekeeping or managing your Housekeeping? Y     Patient Care Team: Chevis Pretty, FNP as PCP - General (Nurse Practitioner) Daneil Dolin, MD as Consulting Physician (Gastroenterology)  Indicate any recent Medical Services you may have received from other than [facility] providers in the past year (date may be approximate).     Assessment:   This is a routine wellness examination for Anjel.  Hearing/Vision screen Vision Screening - Comments:: Annual eye exams wears glasses   Dietary issues and exercise activities discussed: Current Exercise Habits: Home exercise routine, Time (Minutes): 60, Frequency (Times/Week): 1, Weekly Exercise (Minutes/Week): 60, Intensity: Mild, Exercise limited by: Other - see comments (Surgery recovery)   Goals Addressed             This Visit's Progress    DIET - EAT MORE FRUITS AND VEGETABLES   On track      Depression Screen    12/27/2021   11:38 AM 12/20/2021    1:51 PM 12/14/2021   11:49 AM 11/09/2021    1:50 PM 06/18/2021  2:44 PM 02/04/2021    9:26 AM 12/21/2020    2:53 PM  PHQ 2/9 Scores  PHQ - 2 Score 0 0 0 0 0 0 0  PHQ- 9 Score 0 '1 2  1 3 3    '$ Fall Risk    12/27/2021   11:37 AM 12/20/2021    1:51 PM 12/14/2021   11:49 AM 06/18/2021    2:44 PM 02/04/2021     9:26 AM  Fall Risk   Falls in the past year? 0 0 0 0 0  Number falls in past yr: 0      Injury with Fall? 0      Risk for fall due to : No Fall Risks      Follow up Falls prevention discussed        [place]:  Any stairs in or around the home? No  If so, are there any without handrails? No  Home free of loose throw rugs in walkways, pet beds, electrical cords, etc? Yes  Adequate lighting in your home to reduce risk of falls? Yes   ASSISTIVE DEVICES UTILIZED TO PREVENT FALLS:  Life alert? No  Use of a cane, walker or w/c? Yes  Grab bars in the bathroom? No  Shower chair or bench in shower? Yes  Elevated toilet seat or a handicapped toilet? Yes       06/09/2016   10:28 AM 06/08/2015    2:32 PM 12/04/2014   10:40 AM 05/30/2014   12:59 PM  MMSE - Mini Mental State Exam  Orientation to time '5 5 5 5  '$ Orientation to Place '5 5 5 5  '$ Registration '3 3 3 3  '$ Attention/ Calculation '5 4 5 5  '$ Recall '3 3 3 3  '$ Language- name 2 objects '2 2 2 2  '$ Language- repeat '1 1 1 1  '$ Language- follow 3 step command '3 3 3 3  '$ Language- read & follow direction '1 1 1 1  '$ Write a sentence '1 1 1 1  '$ Copy design '1 1 1 1  '$ Total score '30 29 30 30        '$ 12/27/2021   11:41 AM  6CIT Screen  What Year? 0 points  What month? 0 points  What time? 0 points  Count back from 20 0 points  Months in reverse 0 points  Repeat phrase 0 points  Total Score 0 points    Immunizations Immunization History  Administered Date(s) Administered   Fluad Quad(high Dose 65+) 11/18/2019, 12/21/2020, 12/02/2021   Influenza, High Dose Seasonal PF 12/06/2016, 12/20/2017   Influenza, Quadrivalent, Recombinant, Inj, Pf 12/10/2018   Influenza,inj,Quad PF,6+ Mos 12/05/2012, 11/28/2013, 12/04/2014, 11/06/2018   Moderna SARS-COV2 Booster Vaccination 01/11/2020   Moderna Sars-Covid-2 Vaccination 04/18/2019, 05/17/2019   Pneumococcal Conjugate-13 05/30/2014   Pneumococcal Polysaccharide-23  12/05/2012   Tdap 05/30/2014   Zoster, Live 10/03/2014    TDAP status: Up to date  Flu Vaccine status: Up to date  Pneumococcal vaccine status: Up to date  Covid-19 vaccine status: Completed vaccines  Qualifies for Shingles Vaccine? Yes   Zostavax completed No   Shingrix Completed?: No.    Education has been provided regarding the importance of this vaccine. Patient has been advised to call insurance company to determine out of pocket expense if they have not yet received this vaccine. Advised may also receive vaccine at local pharmacy or Health Dept. Verbalized acceptance and understanding.  Screening Tests Health Maintenance  Topic Date Due  COVID-19 Vaccine (3 - Moderna series) 12/30/2021 (Originally 03/07/2020)   Zoster Vaccines- Shingrix (1 of 2) 03/16/2022 (Originally 09/21/1991)   MAMMOGRAM  01/20/2022   Medicare Annual Wellness (AWV)  12/28/2022   DEXA SCAN  02/05/2023   TETANUS/TDAP  05/29/2024   COLONOSCOPY (Pts 45-68yr Insurance coverage will need to be confirmed)  04/22/2025   Pneumonia Vaccine 80 Years old  Completed   INFLUENZA VACCINE  Completed   HPV VACCINES  Aged Out    Health Maintenance  There are no preventive care reminders to display for this patient.   Colorectal cancer screening: No longer required.   Mammogram status: No longer required due to age.  Bone Density status: Completed 02/04/2021. Results reflect: Bone density results: OSTEOPENIA. Repeat every 5 years.  Lung Cancer Screening: (Low Dose CT Chest recommended if Age 80-80years, 30 pack-year currently smoking OR have quit w/in 15years.) does not qualify.   Lung Cancer Screening Referral: n/a  Additional Screening:  Hepatitis C Screening: does not qualify;   Vision Screening: Recommended annual ophthalmology exams for early detection of glaucoma and other disorders of the eye. Is the patient up to date with their annual eye exam?  Yes  Who is the provider or what is the name of  the office in which the patient attends annual eye exams? [facility]  If pt is not established with a provider, would they like to be referred to a provider to establish care? No .   Dental Screening: Recommended annual dental exams for proper oral hygiene  Community Resource Referral / Chronic Care Management: CRR required this visit?  No   CCM required this visit?  No      Plan:     I have personally reviewed and noted the following in the patient's chart:   Medical and social history Use of alcohol, tobacco or illicit drugs  Current medications and supplements including opioid prescriptions. Patient is not currently taking opioid prescriptions. Functional ability and status Nutritional status Physical activity Advanced directives List of other physicians Hospitalizations, surgeries, and ER visits in previous 12 months Vitals Screenings to include cognitive, depression, and falls Referrals and appointments  In addition, I have reviewed and discussed with patient certain preventive protocols, quality metrics, and best practice recommendations. A written personalized care plan for preventive services as well as general preventive health recommendations were provided to patient.     LNNEKA BLANDA LPN   [id number]  Nurse Notes: None

## 2021-12-28 DIAGNOSIS — Z86018 Personal history of other benign neoplasm: Secondary | ICD-10-CM | POA: Diagnosis not present

## 2021-12-28 DIAGNOSIS — E782 Mixed hyperlipidemia: Secondary | ICD-10-CM | POA: Diagnosis not present

## 2021-12-28 DIAGNOSIS — Z9181 History of falling: Secondary | ICD-10-CM | POA: Diagnosis not present

## 2021-12-28 DIAGNOSIS — I4891 Unspecified atrial fibrillation: Secondary | ICD-10-CM | POA: Diagnosis not present

## 2021-12-28 DIAGNOSIS — N289 Disorder of kidney and ureter, unspecified: Secondary | ICD-10-CM | POA: Diagnosis not present

## 2021-12-28 DIAGNOSIS — Z87891 Personal history of nicotine dependence: Secondary | ICD-10-CM | POA: Diagnosis not present

## 2021-12-28 DIAGNOSIS — I10 Essential (primary) hypertension: Secondary | ICD-10-CM | POA: Diagnosis not present

## 2021-12-28 DIAGNOSIS — E559 Vitamin D deficiency, unspecified: Secondary | ICD-10-CM | POA: Diagnosis not present

## 2021-12-28 DIAGNOSIS — Z48815 Encounter for surgical aftercare following surgery on the digestive system: Secondary | ICD-10-CM | POA: Diagnosis not present

## 2021-12-28 DIAGNOSIS — Z7982 Long term (current) use of aspirin: Secondary | ICD-10-CM | POA: Diagnosis not present

## 2021-12-28 DIAGNOSIS — D509 Iron deficiency anemia, unspecified: Secondary | ICD-10-CM | POA: Diagnosis not present

## 2021-12-28 DIAGNOSIS — H269 Unspecified cataract: Secondary | ICD-10-CM | POA: Diagnosis not present

## 2021-12-28 DIAGNOSIS — D519 Vitamin B12 deficiency anemia, unspecified: Secondary | ICD-10-CM | POA: Diagnosis not present

## 2021-12-28 DIAGNOSIS — M810 Age-related osteoporosis without current pathological fracture: Secondary | ICD-10-CM | POA: Diagnosis not present

## 2021-12-28 DIAGNOSIS — R627 Adult failure to thrive: Secondary | ICD-10-CM | POA: Diagnosis not present

## 2021-12-28 DIAGNOSIS — K4130 Unilateral femoral hernia, with obstruction, without gangrene, not specified as recurrent: Secondary | ICD-10-CM | POA: Diagnosis not present

## 2021-12-31 ENCOUNTER — Other Ambulatory Visit: Payer: Self-pay | Admitting: Nurse Practitioner

## 2022-01-03 DIAGNOSIS — Z48815 Encounter for surgical aftercare following surgery on the digestive system: Secondary | ICD-10-CM | POA: Diagnosis not present

## 2022-01-03 DIAGNOSIS — Z9181 History of falling: Secondary | ICD-10-CM | POA: Diagnosis not present

## 2022-01-03 DIAGNOSIS — Z87891 Personal history of nicotine dependence: Secondary | ICD-10-CM | POA: Diagnosis not present

## 2022-01-03 DIAGNOSIS — R627 Adult failure to thrive: Secondary | ICD-10-CM | POA: Diagnosis not present

## 2022-01-03 DIAGNOSIS — Z7982 Long term (current) use of aspirin: Secondary | ICD-10-CM | POA: Diagnosis not present

## 2022-01-03 DIAGNOSIS — K4130 Unilateral femoral hernia, with obstruction, without gangrene, not specified as recurrent: Secondary | ICD-10-CM | POA: Diagnosis not present

## 2022-01-03 DIAGNOSIS — N289 Disorder of kidney and ureter, unspecified: Secondary | ICD-10-CM | POA: Diagnosis not present

## 2022-01-03 DIAGNOSIS — D509 Iron deficiency anemia, unspecified: Secondary | ICD-10-CM | POA: Diagnosis not present

## 2022-01-03 DIAGNOSIS — I4891 Unspecified atrial fibrillation: Secondary | ICD-10-CM | POA: Diagnosis not present

## 2022-01-03 DIAGNOSIS — E782 Mixed hyperlipidemia: Secondary | ICD-10-CM | POA: Diagnosis not present

## 2022-01-03 DIAGNOSIS — H269 Unspecified cataract: Secondary | ICD-10-CM | POA: Diagnosis not present

## 2022-01-03 DIAGNOSIS — D519 Vitamin B12 deficiency anemia, unspecified: Secondary | ICD-10-CM | POA: Diagnosis not present

## 2022-01-03 DIAGNOSIS — I10 Essential (primary) hypertension: Secondary | ICD-10-CM | POA: Diagnosis not present

## 2022-01-03 DIAGNOSIS — M810 Age-related osteoporosis without current pathological fracture: Secondary | ICD-10-CM | POA: Diagnosis not present

## 2022-01-03 DIAGNOSIS — Z86018 Personal history of other benign neoplasm: Secondary | ICD-10-CM | POA: Diagnosis not present

## 2022-01-03 DIAGNOSIS — E559 Vitamin D deficiency, unspecified: Secondary | ICD-10-CM | POA: Diagnosis not present

## 2022-01-11 ENCOUNTER — Telehealth: Payer: Self-pay

## 2022-01-12 ENCOUNTER — Telehealth: Payer: Self-pay | Admitting: Nurse Practitioner

## 2022-01-12 DIAGNOSIS — H269 Unspecified cataract: Secondary | ICD-10-CM | POA: Diagnosis not present

## 2022-01-12 DIAGNOSIS — D519 Vitamin B12 deficiency anemia, unspecified: Secondary | ICD-10-CM | POA: Diagnosis not present

## 2022-01-12 DIAGNOSIS — M810 Age-related osteoporosis without current pathological fracture: Secondary | ICD-10-CM | POA: Diagnosis not present

## 2022-01-12 DIAGNOSIS — Z48815 Encounter for surgical aftercare following surgery on the digestive system: Secondary | ICD-10-CM | POA: Diagnosis not present

## 2022-01-12 DIAGNOSIS — Z87891 Personal history of nicotine dependence: Secondary | ICD-10-CM | POA: Diagnosis not present

## 2022-01-12 DIAGNOSIS — I10 Essential (primary) hypertension: Secondary | ICD-10-CM | POA: Diagnosis not present

## 2022-01-12 DIAGNOSIS — N289 Disorder of kidney and ureter, unspecified: Secondary | ICD-10-CM | POA: Diagnosis not present

## 2022-01-12 DIAGNOSIS — Z9181 History of falling: Secondary | ICD-10-CM | POA: Diagnosis not present

## 2022-01-12 DIAGNOSIS — Z7982 Long term (current) use of aspirin: Secondary | ICD-10-CM | POA: Diagnosis not present

## 2022-01-12 DIAGNOSIS — E559 Vitamin D deficiency, unspecified: Secondary | ICD-10-CM | POA: Diagnosis not present

## 2022-01-12 DIAGNOSIS — E782 Mixed hyperlipidemia: Secondary | ICD-10-CM | POA: Diagnosis not present

## 2022-01-12 DIAGNOSIS — R627 Adult failure to thrive: Secondary | ICD-10-CM | POA: Diagnosis not present

## 2022-01-12 DIAGNOSIS — K4130 Unilateral femoral hernia, with obstruction, without gangrene, not specified as recurrent: Secondary | ICD-10-CM | POA: Diagnosis not present

## 2022-01-12 DIAGNOSIS — I4891 Unspecified atrial fibrillation: Secondary | ICD-10-CM | POA: Diagnosis not present

## 2022-01-12 DIAGNOSIS — D509 Iron deficiency anemia, unspecified: Secondary | ICD-10-CM | POA: Diagnosis not present

## 2022-01-12 DIAGNOSIS — Z86018 Personal history of other benign neoplasm: Secondary | ICD-10-CM | POA: Diagnosis not present

## 2022-01-17 NOTE — Telephone Encounter (Signed)
Please ordr

## 2022-01-18 NOTE — Telephone Encounter (Signed)
TC back to Lorena w/ [place] I had called [place] office this morning and gave VO for SW, did not think any of the [facility] agencies had aides, I am in the process of filling out a PCS form for the patient. Robin [name] believes that the SW is helping her w/ CAPS & PCS as well.

## 2022-01-20 DIAGNOSIS — I4891 Unspecified atrial fibrillation: Secondary | ICD-10-CM | POA: Diagnosis not present

## 2022-01-20 DIAGNOSIS — Z7982 Long term (current) use of aspirin: Secondary | ICD-10-CM | POA: Diagnosis not present

## 2022-01-20 DIAGNOSIS — Z48815 Encounter for surgical aftercare following surgery on the digestive system: Secondary | ICD-10-CM | POA: Diagnosis not present

## 2022-01-20 DIAGNOSIS — Z9181 History of falling: Secondary | ICD-10-CM | POA: Diagnosis not present

## 2022-01-20 DIAGNOSIS — K4130 Unilateral femoral hernia, with obstruction, without gangrene, not specified as recurrent: Secondary | ICD-10-CM | POA: Diagnosis not present

## 2022-01-20 DIAGNOSIS — R627 Adult failure to thrive: Secondary | ICD-10-CM | POA: Diagnosis not present

## 2022-01-20 DIAGNOSIS — I10 Essential (primary) hypertension: Secondary | ICD-10-CM | POA: Diagnosis not present

## 2022-01-20 DIAGNOSIS — Z86018 Personal history of other benign neoplasm: Secondary | ICD-10-CM | POA: Diagnosis not present

## 2022-01-20 DIAGNOSIS — D509 Iron deficiency anemia, unspecified: Secondary | ICD-10-CM | POA: Diagnosis not present

## 2022-01-20 DIAGNOSIS — E559 Vitamin D deficiency, unspecified: Secondary | ICD-10-CM | POA: Diagnosis not present

## 2022-01-20 DIAGNOSIS — E782 Mixed hyperlipidemia: Secondary | ICD-10-CM | POA: Diagnosis not present

## 2022-01-20 DIAGNOSIS — Z87891 Personal history of nicotine dependence: Secondary | ICD-10-CM | POA: Diagnosis not present

## 2022-01-20 DIAGNOSIS — N289 Disorder of kidney and ureter, unspecified: Secondary | ICD-10-CM | POA: Diagnosis not present

## 2022-01-20 DIAGNOSIS — D519 Vitamin B12 deficiency anemia, unspecified: Secondary | ICD-10-CM | POA: Diagnosis not present

## 2022-01-20 DIAGNOSIS — H269 Unspecified cataract: Secondary | ICD-10-CM | POA: Diagnosis not present

## 2022-01-20 DIAGNOSIS — M810 Age-related osteoporosis without current pathological fracture: Secondary | ICD-10-CM | POA: Diagnosis not present

## 2022-01-25 ENCOUNTER — Encounter: Payer: Self-pay | Admitting: General Surgery

## 2022-01-25 ENCOUNTER — Ambulatory Visit (INDEPENDENT_AMBULATORY_CARE_PROVIDER_SITE_OTHER): Payer: Medicare Other | Admitting: General Surgery

## 2022-01-25 VITALS — BP 139/77 | HR 72 | Temp 98.6°F | Resp 12 | Ht 62.0 in | Wt 94.0 lb

## 2022-01-25 DIAGNOSIS — K56609 Unspecified intestinal obstruction, unspecified as to partial versus complete obstruction: Secondary | ICD-10-CM

## 2022-01-25 DIAGNOSIS — K4190 Unilateral femoral hernia, without obstruction or gangrene, not specified as recurrent: Secondary | ICD-10-CM

## 2022-01-25 NOTE — Patient Instructions (Signed)
Activity as tolerated. Diet as tolerated.

## 2022-01-26 DIAGNOSIS — Z87891 Personal history of nicotine dependence: Secondary | ICD-10-CM | POA: Diagnosis not present

## 2022-01-26 DIAGNOSIS — E782 Mixed hyperlipidemia: Secondary | ICD-10-CM | POA: Diagnosis not present

## 2022-01-26 DIAGNOSIS — E559 Vitamin D deficiency, unspecified: Secondary | ICD-10-CM | POA: Diagnosis not present

## 2022-01-26 DIAGNOSIS — I10 Essential (primary) hypertension: Secondary | ICD-10-CM | POA: Diagnosis not present

## 2022-01-26 DIAGNOSIS — K4130 Unilateral femoral hernia, with obstruction, without gangrene, not specified as recurrent: Secondary | ICD-10-CM | POA: Diagnosis not present

## 2022-01-26 DIAGNOSIS — M810 Age-related osteoporosis without current pathological fracture: Secondary | ICD-10-CM | POA: Diagnosis not present

## 2022-01-26 DIAGNOSIS — H269 Unspecified cataract: Secondary | ICD-10-CM | POA: Diagnosis not present

## 2022-01-26 DIAGNOSIS — I4891 Unspecified atrial fibrillation: Secondary | ICD-10-CM | POA: Diagnosis not present

## 2022-01-26 DIAGNOSIS — Z7982 Long term (current) use of aspirin: Secondary | ICD-10-CM | POA: Diagnosis not present

## 2022-01-26 DIAGNOSIS — D519 Vitamin B12 deficiency anemia, unspecified: Secondary | ICD-10-CM | POA: Diagnosis not present

## 2022-01-26 DIAGNOSIS — D509 Iron deficiency anemia, unspecified: Secondary | ICD-10-CM | POA: Diagnosis not present

## 2022-01-26 DIAGNOSIS — Z48815 Encounter for surgical aftercare following surgery on the digestive system: Secondary | ICD-10-CM | POA: Diagnosis not present

## 2022-01-26 DIAGNOSIS — Z9181 History of falling: Secondary | ICD-10-CM | POA: Diagnosis not present

## 2022-01-26 DIAGNOSIS — N289 Disorder of kidney and ureter, unspecified: Secondary | ICD-10-CM | POA: Diagnosis not present

## 2022-01-26 DIAGNOSIS — R627 Adult failure to thrive: Secondary | ICD-10-CM | POA: Diagnosis not present

## 2022-01-26 DIAGNOSIS — Z86018 Personal history of other benign neoplasm: Secondary | ICD-10-CM | POA: Diagnosis not present

## 2022-01-27 NOTE — Progress Notes (Signed)
[facility]  Doing well. Having Bms. Getting around well.  BP 139/77   Pulse 72   Temp 98.6 F (37 C) (Oral)   Resp 12   Ht '5\' 2"'$  (1.575 m)   Wt 94 lb (42.6 kg)   SpO2 98%   BMI 17.19 kg/m  Right inguinal incision healing, induration improving. Still a rim of indurated tissue but improving, swelling improving.  Patient s/p R McVay femoral hernia repair and SBR. Doing well.    Activity as tolerated. Diet as tolerated.   Curlene Labrum, MD [facility] [address] [place], [place] [address] [phone] (office)

## 2022-01-28 ENCOUNTER — Telehealth: Payer: Self-pay | Admitting: Nurse Practitioner

## 2022-01-28 NOTE — Telephone Encounter (Signed)
LMOVM to Morene Antu w/ [facility], PCS was completed & faxed to [place] at [phone] on 01/27/22

## 2022-02-02 DIAGNOSIS — Z48815 Encounter for surgical aftercare following surgery on the digestive system: Secondary | ICD-10-CM | POA: Diagnosis not present

## 2022-02-02 DIAGNOSIS — I4891 Unspecified atrial fibrillation: Secondary | ICD-10-CM | POA: Diagnosis not present

## 2022-02-02 DIAGNOSIS — Z87891 Personal history of nicotine dependence: Secondary | ICD-10-CM | POA: Diagnosis not present

## 2022-02-02 DIAGNOSIS — M810 Age-related osteoporosis without current pathological fracture: Secondary | ICD-10-CM | POA: Diagnosis not present

## 2022-02-02 DIAGNOSIS — E782 Mixed hyperlipidemia: Secondary | ICD-10-CM | POA: Diagnosis not present

## 2022-02-02 DIAGNOSIS — N289 Disorder of kidney and ureter, unspecified: Secondary | ICD-10-CM | POA: Diagnosis not present

## 2022-02-02 DIAGNOSIS — Z9181 History of falling: Secondary | ICD-10-CM | POA: Diagnosis not present

## 2022-02-02 DIAGNOSIS — D519 Vitamin B12 deficiency anemia, unspecified: Secondary | ICD-10-CM | POA: Diagnosis not present

## 2022-02-02 DIAGNOSIS — Z86018 Personal history of other benign neoplasm: Secondary | ICD-10-CM | POA: Diagnosis not present

## 2022-02-02 DIAGNOSIS — H269 Unspecified cataract: Secondary | ICD-10-CM | POA: Diagnosis not present

## 2022-02-02 DIAGNOSIS — E559 Vitamin D deficiency, unspecified: Secondary | ICD-10-CM | POA: Diagnosis not present

## 2022-02-02 DIAGNOSIS — Z7982 Long term (current) use of aspirin: Secondary | ICD-10-CM | POA: Diagnosis not present

## 2022-02-02 DIAGNOSIS — R627 Adult failure to thrive: Secondary | ICD-10-CM | POA: Diagnosis not present

## 2022-02-02 DIAGNOSIS — D509 Iron deficiency anemia, unspecified: Secondary | ICD-10-CM | POA: Diagnosis not present

## 2022-02-02 DIAGNOSIS — K4130 Unilateral femoral hernia, with obstruction, without gangrene, not specified as recurrent: Secondary | ICD-10-CM | POA: Diagnosis not present

## 2022-02-02 DIAGNOSIS — I10 Essential (primary) hypertension: Secondary | ICD-10-CM | POA: Diagnosis not present

## 2022-02-26 IMAGING — DX DG HIP (WITH OR WITHOUT PELVIS) 2-3V*L*
3 series · 3 of 3 positions shown · non-contrast
Comparison: None.

CLINICAL DATA: Hip pain.

EXAM:
DG HIP (WITH OR WITHOUT PELVIS) 2-3V LEFT

[pelvis ap]
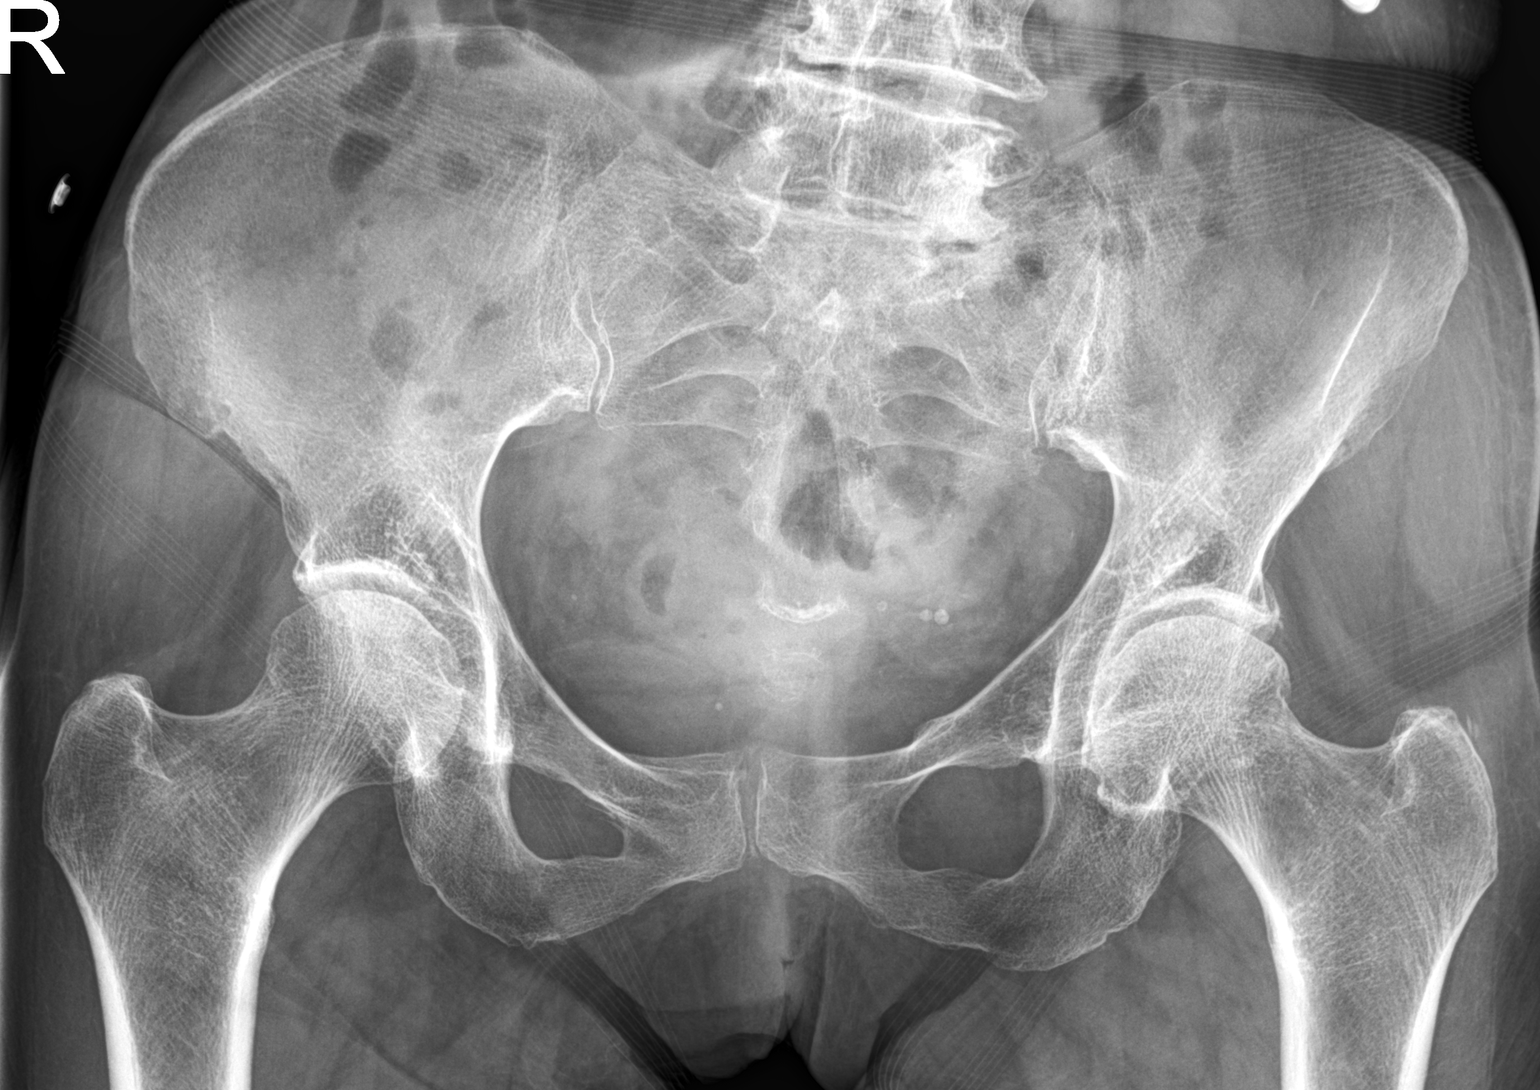

[hip ap]
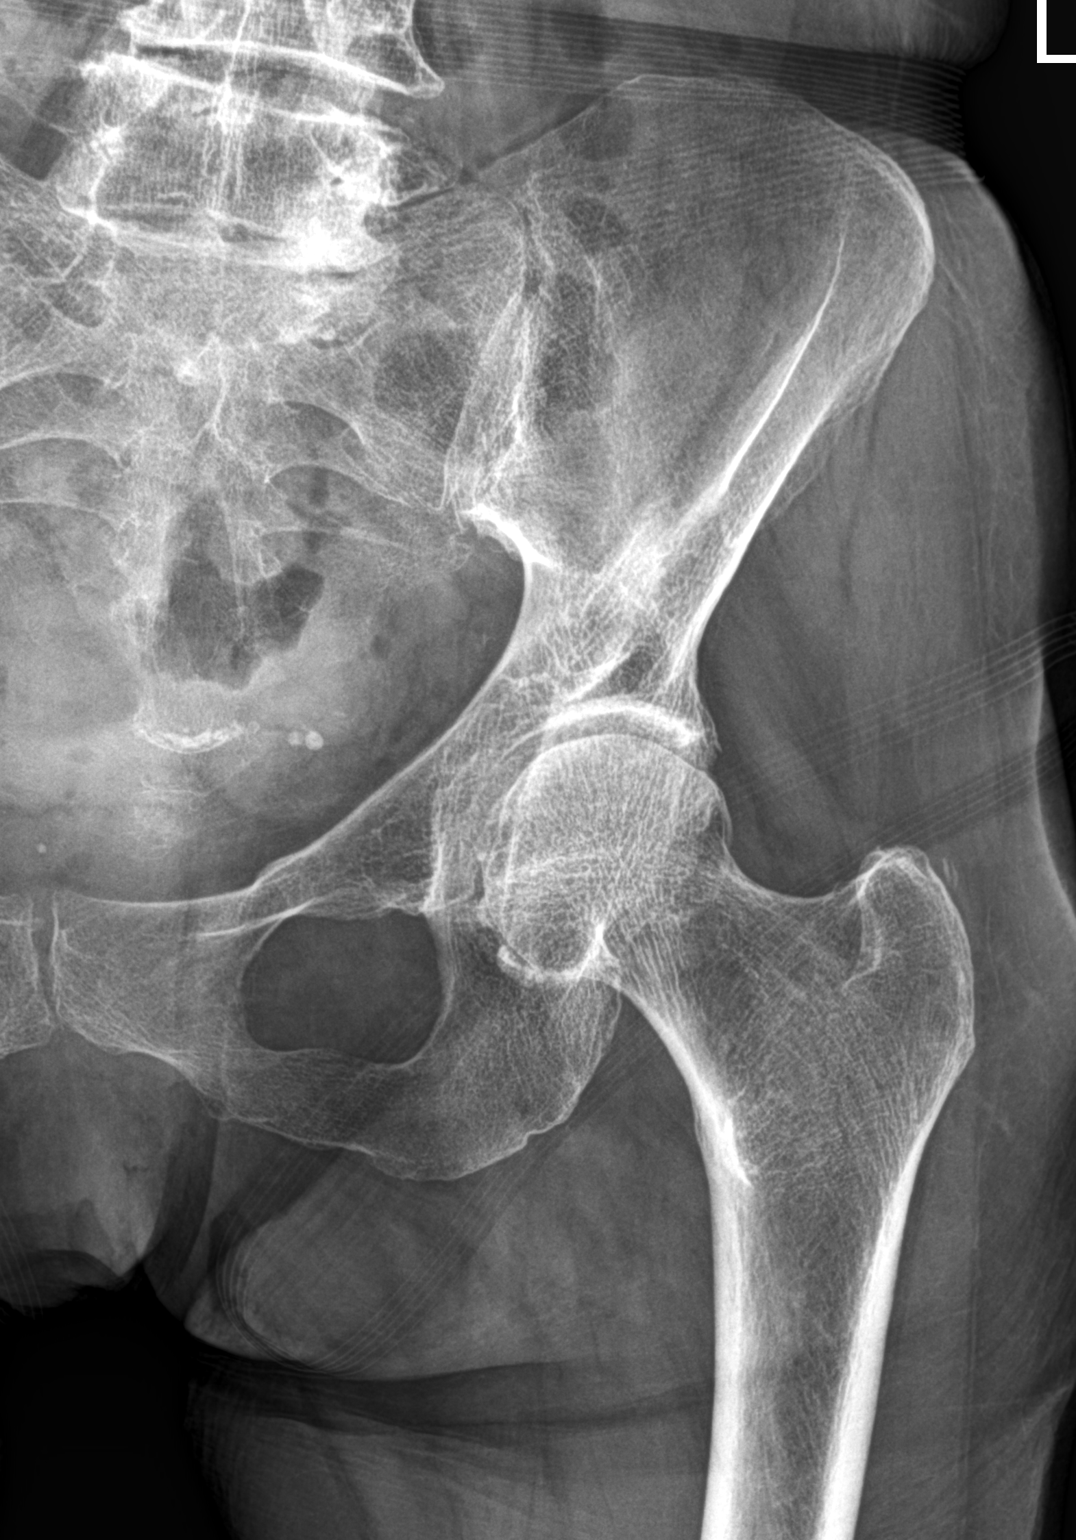

[hip lat]
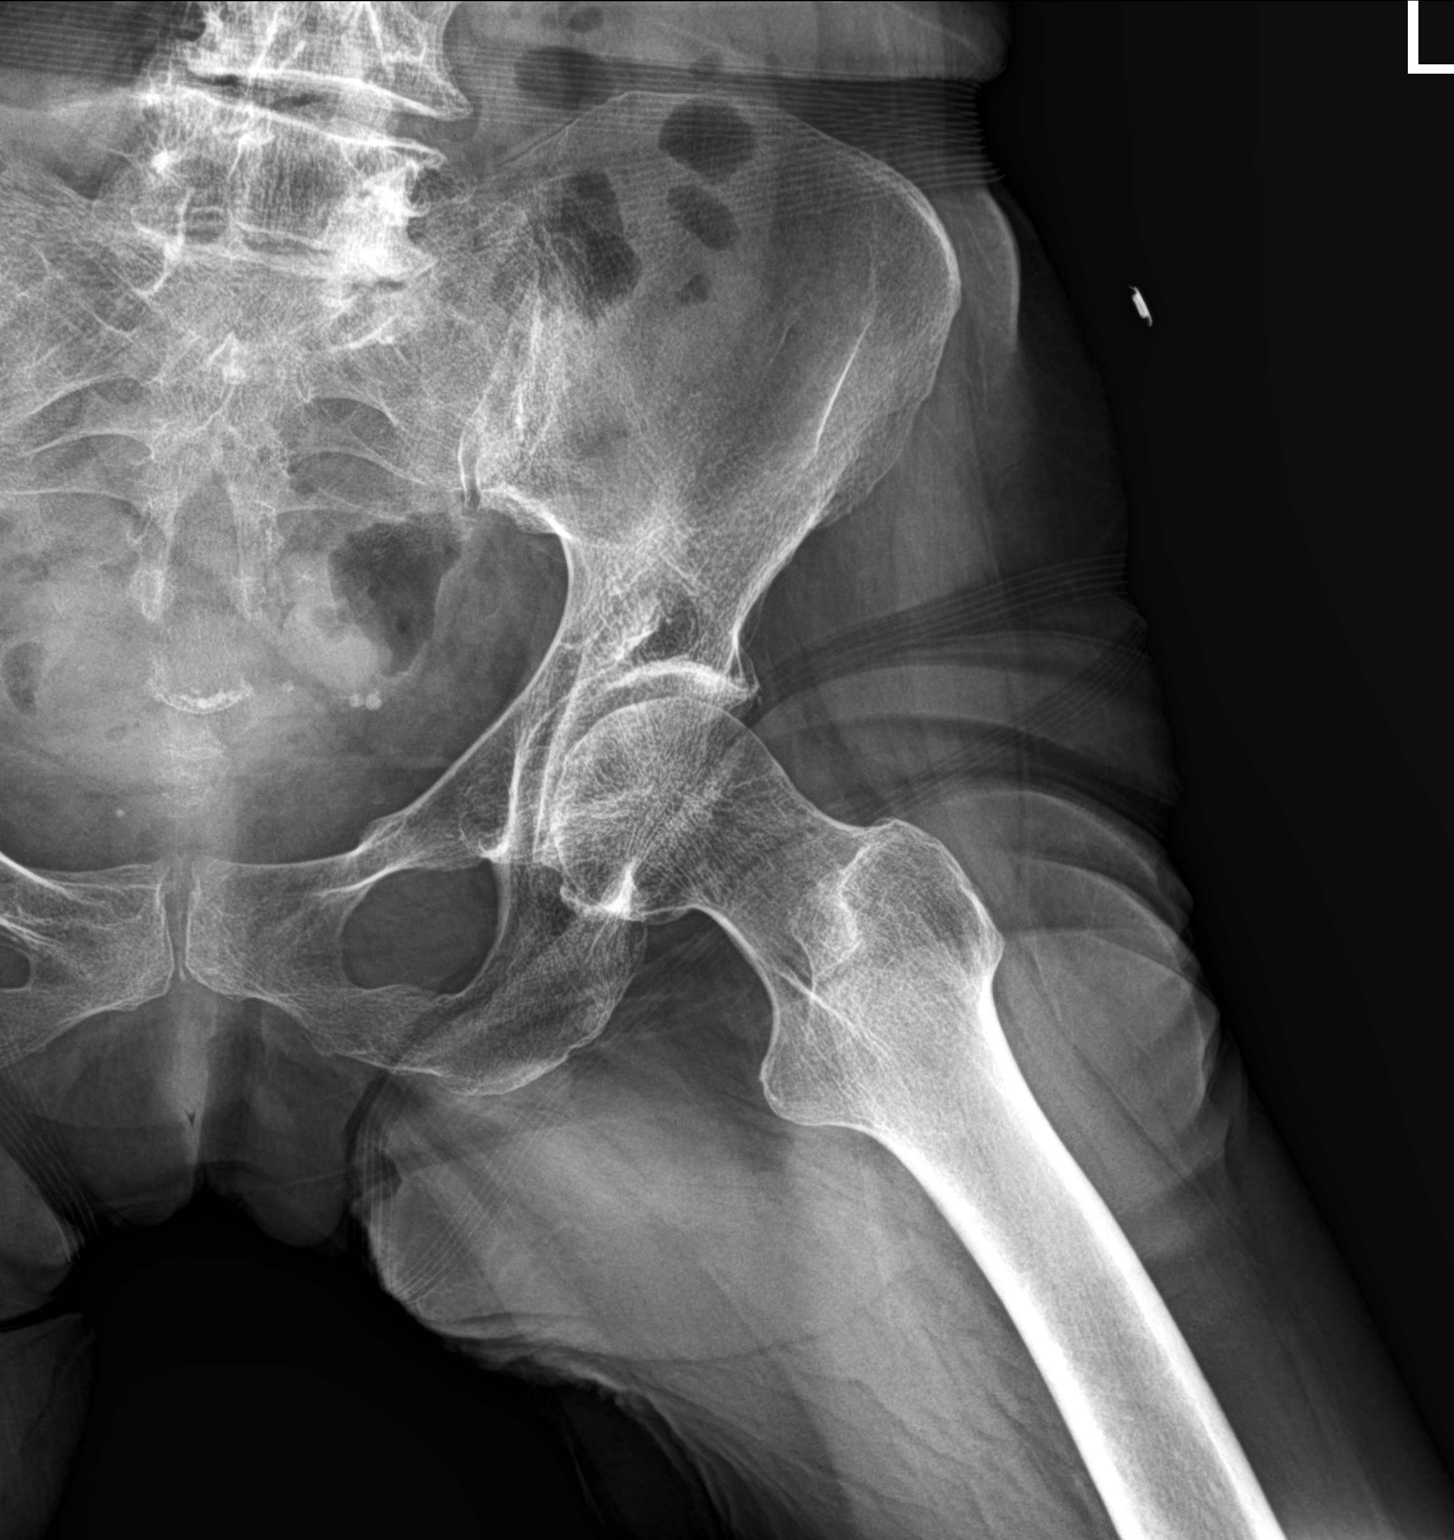

[3 of 3 positions shown; findings below may reference images not displayed]

FINDINGS: There is no evidence of an acute hip fracture or dislocation. A
chronic appearing deformity is seen involving the left superior
pubic ramus. Mild to moderate severity degenerative changes are seen
in the form of joint space narrowing and acetabular sclerosis.
Degenerative changes are also seen within the visualized portion of
the lower lumbar spine.
IMPRESSION: Mild to moderate severity degenerative changes without evidence of
an acute osseous abnormality.

## 2022-04-08 ENCOUNTER — Telehealth: Payer: Self-pay | Admitting: Nurse Practitioner

## 2022-04-08 MED ORDER — PROLIA 60 MG/ML ~~LOC~~ SOSY: PREFILLED_SYRINGE | SUBCUTANEOUS | 0 refills | Status: DC

## 2022-04-08 NOTE — Telephone Encounter (Signed)
Prolia sent to pharmacy for patient. Patient needs to pick up prescription and call to schedule an appointment to receive injection.

## 2022-04-13 ENCOUNTER — Ambulatory Visit (INDEPENDENT_AMBULATORY_CARE_PROVIDER_SITE_OTHER): Payer: 59

## 2022-04-13 DIAGNOSIS — M810 Age-related osteoporosis without current pathological fracture: Secondary | ICD-10-CM

## 2022-04-13 MED ORDER — DENOSUMAB 60 MG/ML ~~LOC~~ SOSY
60.0000 mg | PREFILLED_SYRINGE | Freq: Once | SUBCUTANEOUS | Status: AC
  Administered 2022-04-13: 60 mg via SUBCUTANEOUS

## 2022-04-13 NOTE — Progress Notes (Signed)
Prolia injection given to right thigh per patient request. Patient tolerated well. Patient supplied medication

## 2022-04-14 ENCOUNTER — Telehealth: Payer: Self-pay

## 2022-04-14 NOTE — Telephone Encounter (Signed)
Pt came in the office on 2/21 and stopped by nurses station B asking if Shelah Lewandowsky had completed a form to help her get a shower in her apartment. States that Shelah Lewandowsky should have the form.

## 2022-04-18 NOTE — Telephone Encounter (Signed)
TC to pt letting her know that I mailed the CAPS application to her apartment complex but usually the apartment complexes have their own forms/applications for these type of requests. Pt will check with the apartment complex management and I gave her my direct fax# [phone] for them to fax it to.

## 2022-05-29 DIAGNOSIS — K5900 Constipation, unspecified: Secondary | ICD-10-CM | POA: Diagnosis not present

## 2022-05-29 DIAGNOSIS — K6289 Other specified diseases of anus and rectum: Secondary | ICD-10-CM | POA: Diagnosis not present

## 2022-05-29 DIAGNOSIS — Z7982 Long term (current) use of aspirin: Secondary | ICD-10-CM | POA: Diagnosis not present

## 2022-05-29 DIAGNOSIS — K625 Hemorrhage of anus and rectum: Secondary | ICD-10-CM | POA: Diagnosis not present

## 2022-05-29 DIAGNOSIS — E7800 Pure hypercholesterolemia, unspecified: Secondary | ICD-10-CM | POA: Diagnosis not present

## 2022-05-29 DIAGNOSIS — Z87891 Personal history of nicotine dependence: Secondary | ICD-10-CM | POA: Diagnosis not present

## 2022-05-29 DIAGNOSIS — K644 Residual hemorrhoidal skin tags: Secondary | ICD-10-CM | POA: Diagnosis not present

## 2022-05-29 DIAGNOSIS — Z79899 Other long term (current) drug therapy: Secondary | ICD-10-CM | POA: Diagnosis not present

## 2022-06-20 ENCOUNTER — Ambulatory Visit (INDEPENDENT_AMBULATORY_CARE_PROVIDER_SITE_OTHER): Payer: 59 | Admitting: Nurse Practitioner

## 2022-06-20 ENCOUNTER — Encounter: Payer: Self-pay | Admitting: Nurse Practitioner

## 2022-06-20 VITALS — BP 85/66 | HR 79 | Temp 98.0°F | Resp 20 | Ht 62.0 in | Wt 94.0 lb

## 2022-06-20 DIAGNOSIS — E875 Hyperkalemia: Secondary | ICD-10-CM | POA: Diagnosis not present

## 2022-06-20 DIAGNOSIS — R2681 Unsteadiness on feet: Secondary | ICD-10-CM

## 2022-06-20 DIAGNOSIS — R636 Underweight: Secondary | ICD-10-CM | POA: Diagnosis not present

## 2022-06-20 DIAGNOSIS — I10 Essential (primary) hypertension: Secondary | ICD-10-CM

## 2022-06-20 DIAGNOSIS — F411 Generalized anxiety disorder: Secondary | ICD-10-CM

## 2022-06-20 DIAGNOSIS — M810 Age-related osteoporosis without current pathological fracture: Secondary | ICD-10-CM | POA: Diagnosis not present

## 2022-06-20 DIAGNOSIS — Z681 Body mass index (BMI) 19 or less, adult: Secondary | ICD-10-CM

## 2022-06-20 DIAGNOSIS — E559 Vitamin D deficiency, unspecified: Secondary | ICD-10-CM

## 2022-06-20 DIAGNOSIS — N289 Disorder of kidney and ureter, unspecified: Secondary | ICD-10-CM

## 2022-06-20 DIAGNOSIS — E782 Mixed hyperlipidemia: Secondary | ICD-10-CM

## 2022-06-20 MED ORDER — FERROUS SULFATE 325 (65 FE) MG PO TBEC: 325.0000 mg | DELAYED_RELEASE_TABLET | Freq: Two times a day (BID) | ORAL | 1 refills | Status: DC

## 2022-06-20 MED ORDER — PROLIA 60 MG/ML ~~LOC~~ SOSY: PREFILLED_SYRINGE | SUBCUTANEOUS | 0 refills | Status: DC

## 2022-06-20 MED ORDER — ATORVASTATIN CALCIUM 80 MG PO TABS: 80.0000 mg | ORAL_TABLET | Freq: Every day | ORAL | 1 refills | Status: DC

## 2022-06-20 MED ORDER — ESCITALOPRAM OXALATE 10 MG PO TABS: 10.0000 mg | ORAL_TABLET | Freq: Every day | ORAL | 1 refills | Status: DC

## 2022-06-20 NOTE — Patient Instructions (Signed)
Fall Prevention in the Home, Adult Falls can cause injuries and can happen to people of all ages. There are many things you can do to make your home safer and to help prevent falls. What actions can I take to prevent falls? General information Use good lighting in all rooms. Make sure to: Replace any light bulbs that burn out. Turn on the lights in dark areas and use night-lights. Keep items that you use often in easy-to-reach places. Lower the shelves around your home if needed. Move furniture so that there are clear paths around it. Do not use throw rugs or other things on the floor that can make you trip. If any of your floors are uneven, fix them. Add color or contrast paint or tape to clearly mark and help you see: Grab bars or handrails. First and last steps of staircases. Where the edge of each step is. If you use a ladder or stepladder: Make sure that it is fully opened. Do not climb a closed ladder. Make sure the sides of the ladder are locked in place. Have someone hold the ladder while you use it. Know where your pets are as you move through your home. What can I do in the bathroom?     Keep the floor dry. Clean up any water on the floor right away. Remove soap buildup in the bathtub or shower. Buildup makes bathtubs and showers slippery. Use non-skid mats or decals on the floor of the bathtub or shower. Attach bath mats securely with double-sided, non-slip rug tape. If you need to sit down in the shower, use a non-slip stool. Install grab bars by the toilet and in the bathtub and shower. Do not use towel bars as grab bars. What can I do in the bedroom? Make sure that you have a light by your bed that is easy to reach. Do not use any sheets or blankets on your bed that hang to the floor. Have a firm chair or bench with side arms that you can use for support when you get dressed. What can I do in the kitchen? Clean up any spills right away. If you need to reach something  above you, use a step stool with a grab bar. Keep electrical cords out of the way. Do not use floor polish or wax that makes floors slippery. What can I do with my stairs? Do not leave anything on the stairs. Make sure that you have a light switch at the top and the bottom of the stairs. Make sure that there are handrails on both sides of the stairs. Fix handrails that are broken or loose. Install non-slip stair treads on all your stairs if they do not have carpet. Avoid having throw rugs at the top or bottom of the stairs. Choose a carpet that does not hide the edge of the steps on the stairs. Make sure that the carpet is firmly attached to the stairs. Fix carpet that is loose or worn. What can I do on the outside of my home? Use bright outdoor lighting. Fix the edges of walkways and driveways and fix any cracks. Clear paths of anything that can make you trip, such as tools or rocks. Add color or contrast paint or tape to clearly mark and help you see anything that might make you trip as you walk through a door, such as a raised step or threshold. Trim any bushes or trees on paths to your home. Check to see if handrails are loose   or broken and that both sides of all steps have handrails. Install guardrails along the edges of any raised decks and porches. Have leaves, snow, or ice cleared regularly. Use sand, salt, or ice melter on paths if you live where there is ice and snow during the winter. Clean up any spills in your garage right away. This includes grease or oil spills. What other actions can I take? Review your medicines with your doctor. Some medicines can cause dizziness or changes in blood pressure, which increase your risk of falling. Wear shoes that: Have a low heel. Do not wear high heels. Have rubber bottoms and are closed at the toe. Feel good on your feet and fit well. Use tools that help you move around if needed. These include: Canes. Walkers. Scooters. Crutches. Ask  your doctor what else you can do to help prevent falls. This may include seeing a physical therapist to learn to do exercises to move better and get stronger. Where to find more information Centers for Disease Control and Prevention, STEAD[employer]: cdc.gov National Institute on Aging: nia.nih.gov National Institute on Aging: nia.nih.gov Contact a doctor if: You are afraid of falling at home. You feel weak, drowsy, or dizzy at home. You fall at home. Get help right away if you: Lose consciousness or have trouble moving after a fall. Have a fall that causes a head injury. These symptoms may be an emergency. Get help right away. Call 911. Do not wait to see if the symptoms will go away. Do not drive yourself to the hospital. This information is not intended to replace advice given to you by your health care provider. Make sure you discuss any questions you have with your health care provider. Document Revised: 10/11/2021 Document Reviewed: 10/11/2021 Elsevier Patient Education  2023 Elsevier Inc.  

## 2022-06-20 NOTE — Progress Notes (Addendum)
Subjective:    Patient ID: Robin [name], female    DOB: Sep 21, 1940, 82 y.o.   MRN: [id number]   Chief Complaint: medical management of chronic issues     HPI:  Robin [name] is a 82 y.o. who identifies as a female who was assigned female at birth.   Social history: Lives with: daughter Work history: retired   Water engineer in today for follow up of the following chronic medical issues:  1. Essential hypertension No c/o chest pain, sob or headache. Does not check blood pressure at home. BP Readings from Last 3 Encounters:  01/25/22 139/77  12/20/21 127/77  12/14/21 117/68     2. Mixed hyperlipidemia Does not watch diet and does no dedicated exercise. Takes lipitor daily- may change to crestor if still high at next visit Lab Results  Component Value Date   CHOL 238 (H) 12/21/2020   HDL 47 12/21/2020   LDLCALC 168 (H) 12/21/2020   TRIG 129 12/21/2020   CHOLHDL 5.1 (H) 12/21/2020     3. Hyperkalemia Denies any muscle cramping Lab Results  Component Value Date   K 3.6 12/06/2021     4. Hypercalcemia Lab Results  Component Value Date   CALCIUM 8.2 (L) 12/06/2021   PHOS 3.0 12/06/2021   PHOS 3.0 12/06/2021     5. GAD (generalized anxiety disorder) Has been on lexapro for several years.    06/20/2022    1:55 PM 12/20/2021    1:51 PM 12/14/2021   11:50 AM 06/18/2021    2:44 PM  GAD 7 : Generalized Anxiety Score  Nervous, Anxious, on Edge 0 0 0 0  Control/stop worrying 0 0 0 0  Worry too much - different things 0 0 0 1  Trouble relaxing 0 0 0 0  Restless 0 0 0 0  Easily annoyed or irritable 0 0 0 0  Afraid - awful might happen 0 0 0 0  Total GAD 7 Score 0 0 0 1  Anxiety Difficulty Not difficult at all Not difficult at all Not difficult at all Somewhat difficult       06/20/2022    1:55 PM 12/27/2021   11:38 AM 12/20/2021    1:51 PM  Depression screen PHQ 2/9  Decreased Interest 0 0 0  Down, Depressed, Hopeless 0 0 0  PHQ - 2 Score 0 0 0  Altered  sleeping 0 0 0  Tired, decreased energy 0 0 1  Change in appetite 0 0 0  Feeling bad or failure about yourself  0 0 0  Trouble concentrating 0 0 0  Moving slowly or fidgety/restless 0 0 0  Suicidal thoughts 0 0 0  PHQ-9 Score 0 0 1  Difficult doing work/chores Not difficult at all Not difficult at all Somewhat difficult     6. Age related osteoporosis, unspecified pathological fracture presence Last dexascan was done on 02/03/21. Her t score was -3.2. she does no weight bearing exercise.   7. Vitamin D insufficiency Is on daily vitamin d supplement Last vitamin D Lab Results  Component Value Date   VD25OH 52.65 12/02/2021     8. Underweight Has maintained weight Wt Readings from Last 3 Encounters:  06/20/22 94 lb (42.6 kg)  01/25/22 94 lb (42.6 kg)  12/27/21 99 lb (44.9 kg)   BMI Readings from Last 3 Encounters:  06/20/22 17.19 kg/m  01/25/22 17.19 kg/m  12/27/21 18.11 kg/m     9. Renal lesion Had a small lesion show up  on recent CT scan. Needs nonurgent MRI- will schedule   New complaints: Patient has trouble going from sitting to standing. Needs help getting up at times.   No Known Allergies Outpatient Encounter Medications as of 06/20/2022  Medication Sig   aspirin EC 81 MG tablet Take 81 mg by mouth daily.   atorvastatin (LIPITOR) 80 MG tablet Take 1 tablet (80 mg total) by mouth daily.   Cholecalciferol (VITAMIN D) 2000 UNITS tablet Take 4000IU on saturdays and sundays and 2000IU all other days. (Patient taking differently: Take 2,000 Units by mouth See admin instructions. Take 1 tablet (2000 units) on Mondays through Fridays and take 2 tablets (4000 units) by mouth on Saturdays & Sundays.)   cyanocobalamin 1000 MCG tablet Take 1 tablet (1,000 mcg total) by mouth daily.   denosumab (PROLIA) 60 MG/ML SOSY injection BRING TO OFFICE FOR ADMINISTRATION. ADMINISTER IN UPPER ARM, THIGH OR ABDOMEN   ENSURE PLUS (ENSURE PLUS) LIQD Take 1 Can by mouth daily. Dx:   R63.6 (underweight)   escitalopram (LEXAPRO) 10 MG tablet Take 1 tablet (10 mg total) by mouth daily.   ferrous sulfate 325 (65 FE) MG EC tablet Take 1 tablet (325 mg total) by mouth 2 (two) times daily.   lisinopril (ZESTRIL) 10 MG tablet Take 1 tablet (10 mg total) by mouth daily.   senna-docusate (SENOKOT-S) 8.6-50 MG tablet Take 1 tablet by mouth 2 (two) times daily between meals as needed for moderate constipation.   No facility-administered encounter medications on file as of 06/20/2022.    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 Jarold Motto: 5cm circumferential fungating tumor at the hepatic flexure, path tubulovillous adenoma.    COLONOSCOPY  01/02/2006   Dr. 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 Multiple polyps around anastomosis of different sizes,    COLONOSCOPY  02/21/2006   Dr. Jarold Motto. Referred to Dr. Corliss Skains for resection   COLONOSCOPY N/A 07/18/2014   Procedure: COLONOSCOPY;  Surgeon: Corbin Ade, MD;  Location: [facility];  Service: Endoscopy;  Laterality: N/A;  1230   COLONOSCOPY WITH PROPOFOL N/A 04/22/2020   Procedure: COLONOSCOPY WITH PROPOFOL;  Surgeon: Malissa Hippo, MD;  Location: [facility];  Service: Endoscopy;  Laterality: N/A;  am   EYE SURGERY     FEMORAL HERNIA REPAIR Right 12/01/2021   Procedure: HERNIA REPAIR FEMORAL, McVay Repair;  Surgeon: Lucretia Roers, MD;  Location: [facility];  Service: General;  Laterality: Right;   LAPAROSCOPIC LYSIS OF ADHESIONS     PARTIAL COLECTOMY  02/21/2005   due to tubulovillous adenoma   TUBAL LIGATION       Family History  Problem Relation Age of Onset   Diabetes Mother    Heart disease Mother    Cancer Mother        patient unsure of type   Hip fracture Mother    Heart disease Father        antigioplasty   Cancer Sister        lung   COPD Sister    Heart disease Sister    Atrial fibrillation Sister    Heart  disease Sister        stent in left leg   Varicose Veins Sister    Deep vein thrombosis Sister    Heart disease Brother    COPD Brother    COPD Brother    Huntington's disease Daughter    Carpal tunnel syndrome Daughter    Brain cancer Son    Colon cancer Neg Hx       Controlled substance contract: n/a     Review of Systems  Constitutional:  Negative for diaphoresis.  Eyes:  Negative for pain.  Respiratory:  Negative for shortness of breath.   Cardiovascular:  Negative for chest pain, palpitations and leg swelling.  Gastrointestinal:  Negative for abdominal pain.  Endocrine: Negative for polydipsia.  Skin:  Negative for rash.  Neurological:  Negative for dizziness, weakness and headaches.  Hematological:  Does not bruise/bleed easily.  All other systems reviewed and are negative.      Objective:   Physical Exam Vitals and nursing note reviewed.  Constitutional:      General: She is not in acute distress.    Appearance: Normal appearance. She is well-developed.  HENT:     Head: Normocephalic.     Right Ear: Tympanic membrane normal.     Left Ear: Tympanic membrane normal.     Nose: Nose normal.     Mouth/Throat:     Mouth: Mucous membranes are moist.  Eyes:     Pupils: Pupils are equal, round, and reactive to light.  Neck:     Vascular: No carotid bruit or JVD.  Cardiovascular:     Rate and Rhythm: Normal rate and regular rhythm.     Heart sounds: Normal heart sounds.  Pulmonary:     Effort: Pulmonary effort is normal. No respiratory distress.     Breath sounds: Normal breath sounds. No wheezing or rales.  Chest:     Chest wall: No  tenderness.  Abdominal:     General: Bowel sounds are normal. There is no distension or abdominal bruit.     Palpations: Abdomen is soft. There is no hepatomegaly, splenomegaly, mass or pulsatile mass.     Tenderness: There is no abdominal tenderness.  Musculoskeletal:        General: Normal range of motion.     Cervical back: Normal range of motion and neck supple.  Lymphadenopathy:     Cervical: No cervical adenopathy.  Skin:    General: Skin is warm and dry.  Neurological:     Mental Status: She is alert and oriented to person, place, and time.     Deep Tendon Reflexes: Reflexes are normal and symmetric.  Psychiatric:        Behavior: Behavior normal.        Thought Content: Thought content normal.        Judgment: Judgment normal.    BP (!) 85/66   Pulse 79   Temp 98 F (36.7 C) (Temporal)   Resp 20   Ht 5\' 2"  (1.575 m)   Wt 94 lb (42.6 kg)   SpO2 98%   BMI 17.19 kg/m         Assessment & Plan:   CARAN MILLSON comes in today with chief complaint of Medical Management of Chronic Issues   Diagnosis and orders addressed:  1. Essential hypertension Low sodium diet - CBC with Differential/Platelet - CMP14+EGFR  2. Mixed hyperlipidemia Low fat diet - atorvastatin (LIPITOR) 80 MG tablet; Take 1 tablet (80 mg total) by  mouth daily.  Dispense: 90 tablet; Refill: 1 - Lipid panel  3. Hyperkalemia Labs pending  4. Hypercalcemia Labs pending  5. GAD (generalized anxiety disorder) Stress management - escitalopram (LEXAPRO) 10 MG tablet; Take 1 tablet (10 mg total) by mouth daily.  Dispense: 90 tablet; Refill: 1  6. Age related osteoporosis, unspecified pathological fracture presence Weight bearing exercise  7. Vitamin D insufficiency Continue vitamin d supplement  8. Underweight Continue ensure daily- says getting expensive- can drink chocolate milk in mornings and eat ice cream at night if that is cheaper- need extra calories  9. Renal lesion MIR   ordered - MR Abdomen W Wo Contrast; Future  10. Difficulty standing Needs lift chair to help her up    Labs pending Health Maintenance reviewed Diet and exercise encouraged  Follow up plan: 6 months   Robin Daphine Deutscher, FNP

## 2022-06-21 LAB — CMP14+EGFR
ALT: 8 IU/L (ref 0–32)
AST: 15 IU/L (ref 0–40)
Albumin/Globulin Ratio: 2.1 (ref 1.2–2.2)
Albumin: 4.1 g/dL (ref 3.8–4.8)
Alkaline Phosphatase: 53 IU/L (ref 44–121)
BUN/Creatinine Ratio: 9 — ABNORMAL LOW (ref 12–28)
BUN: 9 mg/dL (ref 8–27)
Bilirubin Total: 0.4 mg/dL (ref 0.0–1.2)
CO2: 23 mmol/L (ref 20–29)
Calcium: 9.7 mg/dL (ref 8.7–10.3)
Chloride: 105 mmol/L (ref 96–106)
Creatinine, Ser: 0.97 mg/dL (ref 0.57–1.00)
Globulin, Total: 2 g/dL (ref 1.5–4.5)
Glucose: 63 mg/dL — ABNORMAL LOW (ref 70–99)
Potassium: 4.4 mmol/L (ref 3.5–5.2)
Sodium: 145 mmol/L — ABNORMAL HIGH (ref 134–144)
Total Protein: 6.1 g/dL (ref 6.0–8.5)
eGFR: 59 mL/min/{1.73_m2} — ABNORMAL LOW (ref >59)

## 2022-06-21 LAB — CBC WITH DIFFERENTIAL/PLATELET
Basophils Absolute: 0 10*3/uL (ref 0.0–0.2)
Basos: 1 %
EOS (ABSOLUTE): 0.1 10*3/uL (ref 0.0–0.4)
Eos: 2 %
Hematocrit: 33.5 % — ABNORMAL LOW (ref 34.0–46.6)
Hemoglobin: 10.7 g/dL — ABNORMAL LOW (ref 11.1–15.9)
Immature Grans (Abs): 0 10*3/uL (ref 0.0–0.1)
Immature Granulocytes: 0 %
Lymphocytes Absolute: 1.3 10*3/uL (ref 0.7–3.1)
Lymphs: 30 %
MCH: 29.5 pg (ref 26.6–33.0)
MCHC: 31.9 g/dL (ref 31.5–35.7)
MCV: 92 fL (ref 79–97)
Monocytes Absolute: 0.4 10*3/uL (ref 0.1–0.9)
Monocytes: 9 %
Neutrophils Absolute: 2.6 10*3/uL (ref 1.4–7.0)
Neutrophils: 58 %
Platelets: 247 10*3/uL (ref 150–450)
RBC: 3.63 x10E6/uL — ABNORMAL LOW (ref 3.77–5.28)
RDW: 13 % (ref 11.7–15.4)
WBC: 4.5 10*3/uL (ref 3.4–10.8)

## 2022-06-21 LAB — LIPID PANEL
Chol/HDL Ratio: 4 ratio (ref 0.0–4.4)
Cholesterol, Total: 241 mg/dL — ABNORMAL HIGH (ref 100–199)
HDL: 60 mg/dL (ref >39)
LDL Chol Calc (NIH): 165 mg/dL — ABNORMAL HIGH (ref 0–99)
Triglycerides: 93 mg/dL (ref 0–149)
VLDL Cholesterol Cal: 16 mg/dL (ref 5–40)

## 2022-07-06 ENCOUNTER — Telehealth: Payer: Self-pay | Admitting: Nurse Practitioner

## 2022-07-08 NOTE — Telephone Encounter (Signed)
TC to pt that I and PCP's nurse have not received a form from her apartment complex, the only thing that has been done is for CAPS program back in January. She will have the manager fax it when she comes back next week.

## 2022-07-19 ENCOUNTER — Ambulatory Visit (HOSPITAL_COMMUNITY)
Admission: RE | Admit: 2022-07-19 | Discharge: 2022-07-19 | Disposition: A | Discharge status: Home / Self Care | Payer: 59 | Source: Ambulatory Visit | Attending: Nurse Practitioner | Admitting: Nurse Practitioner

## 2022-07-19 DIAGNOSIS — N289 Disorder of kidney and ureter, unspecified: Secondary | ICD-10-CM | POA: Diagnosis not present

## 2022-07-19 DIAGNOSIS — N281 Cyst of kidney, acquired: Secondary | ICD-10-CM | POA: Diagnosis not present

## 2022-07-19 MED ORDER — GADOBUTROL 1 MMOL/ML IV SOLN
4.0000 mL | Freq: Once | INTRAVENOUS | Status: AC | PRN
  Administered 2022-07-19: 4 mL via INTRAVENOUS

## 2022-09-02 DIAGNOSIS — K1121 Acute sialoadenitis: Secondary | ICD-10-CM | POA: Diagnosis not present

## 2022-10-06 ENCOUNTER — Telehealth: Payer: Self-pay

## 2022-10-06 ENCOUNTER — Other Ambulatory Visit (HOSPITAL_COMMUNITY): Payer: Self-pay

## 2022-10-06 ENCOUNTER — Telehealth: Payer: Self-pay | Admitting: Nurse Practitioner

## 2022-10-06 NOTE — Telephone Encounter (Signed)
Please check Prolia benefits - last injection: 04/13/22

## 2022-10-06 NOTE — Telephone Encounter (Signed)
Created new encounter for Prolia BIV. Will route encounter back once benefit verification is complete.  

## 2022-10-06 NOTE — Telephone Encounter (Signed)
Prolia VOB initiated via [email or web address]  Next Prolia inj DUE: 10/11/22

## 2022-10-10 ENCOUNTER — Other Ambulatory Visit (HOSPITAL_COMMUNITY): Payer: Self-pay

## 2022-10-10 NOTE — Telephone Encounter (Signed)
Authorization Number: [id number] 10/10/22-10/10/23

## 2022-10-10 NOTE — Telephone Encounter (Signed)
Pt ready for scheduling for PROLIA on or after : 10/11/22  Out-of-pocket cost due at time of visit: $345  Primary: [employer] Prolia co-insurance: 20% Admin fee co-insurance: 20%  Secondary: [place] MEDICAID Prolia co-insurance:  Admin fee co-insurance:   Medical Benefit Details: Date Benefits were checked: 10/10/22 Deductible: NO/ Coinsurance: 20%/ Admin Fee: 20%  Prior Auth: APPROVED PA# [id number]  Expiration Date: 10/10/22-10/10/23   # of doses approved: 2  Pharmacy benefit: Copay $0 If patient wants fill through the pharmacy benefit please send prescription to: [facility], and include estimated need by date in rx notes. Pharmacy will ship medication directly to the office.  Patient not eligible for Prolia Copay Card. Copay Card can make patient's cost as little as $25. Link to apply: [email or web address]://[email or web address]  ** This summary of benefits is an estimation of the patient's out-of-pocket cost. Exact cost may very based on individual plan coverage.

## 2022-10-18 MED ORDER — PROLIA 60 MG/ML ~~LOC~~ SOSY: PREFILLED_SYRINGE | SUBCUTANEOUS | 0 refills | Status: DC

## 2022-10-18 NOTE — Addendum Note (Signed)
Addended by: Tamera Punt on: 10/18/2022 02:21 PM   Modules accepted: Orders

## 2022-10-18 NOTE — Telephone Encounter (Signed)
Prolia sent to pharmacy. No answer at home. Patient needs to pick up prolia and schedule a nurse visit.

## 2022-10-19 NOTE — Telephone Encounter (Signed)
Appt scheduled for 9/4

## 2022-10-26 ENCOUNTER — Ambulatory Visit: Payer: 59

## 2022-10-27 ENCOUNTER — Ambulatory Visit (INDEPENDENT_AMBULATORY_CARE_PROVIDER_SITE_OTHER): Payer: 59

## 2022-10-27 DIAGNOSIS — M810 Age-related osteoporosis without current pathological fracture: Secondary | ICD-10-CM

## 2022-10-27 MED ORDER — DENOSUMAB 60 MG/ML ~~LOC~~ SOSY
60.0000 mg | PREFILLED_SYRINGE | Freq: Once | SUBCUTANEOUS | Status: AC
Start: 2022-10-27 — End: 2022-10-27
  Administered 2022-10-27: 60 mg via SUBCUTANEOUS

## 2022-10-27 NOTE — Progress Notes (Signed)
Prolia injection given to left thigh per patient request. Patient tolerated well.  Patient supplied.

## 2022-12-20 ENCOUNTER — Ambulatory Visit (INDEPENDENT_AMBULATORY_CARE_PROVIDER_SITE_OTHER): Payer: 59

## 2022-12-20 ENCOUNTER — Ambulatory Visit (INDEPENDENT_AMBULATORY_CARE_PROVIDER_SITE_OTHER): Payer: 59 | Admitting: Nurse Practitioner

## 2022-12-20 ENCOUNTER — Encounter: Payer: Self-pay | Admitting: Nurse Practitioner

## 2022-12-20 VITALS — BP 139/81 | HR 63 | Temp 98.2°F | Resp 20 | Ht 62.0 in | Wt 88.0 lb

## 2022-12-20 DIAGNOSIS — E782 Mixed hyperlipidemia: Secondary | ICD-10-CM

## 2022-12-20 DIAGNOSIS — E559 Vitamin D deficiency, unspecified: Secondary | ICD-10-CM

## 2022-12-20 DIAGNOSIS — Z23 Encounter for immunization: Secondary | ICD-10-CM | POA: Diagnosis not present

## 2022-12-20 DIAGNOSIS — E875 Hyperkalemia: Secondary | ICD-10-CM | POA: Diagnosis not present

## 2022-12-20 DIAGNOSIS — M810 Age-related osteoporosis without current pathological fracture: Secondary | ICD-10-CM

## 2022-12-20 DIAGNOSIS — I10 Essential (primary) hypertension: Secondary | ICD-10-CM

## 2022-12-20 DIAGNOSIS — R636 Underweight: Secondary | ICD-10-CM

## 2022-12-20 DIAGNOSIS — R918 Other nonspecific abnormal finding of lung field: Secondary | ICD-10-CM | POA: Diagnosis not present

## 2022-12-20 DIAGNOSIS — F411 Generalized anxiety disorder: Secondary | ICD-10-CM

## 2022-12-20 DIAGNOSIS — R0602 Shortness of breath: Secondary | ICD-10-CM | POA: Diagnosis not present

## 2022-12-20 DIAGNOSIS — I700 Atherosclerosis of aorta: Secondary | ICD-10-CM | POA: Diagnosis not present

## 2022-12-20 MED ORDER — ATORVASTATIN CALCIUM 80 MG PO TABS: 80.0000 mg | ORAL_TABLET | Freq: Every day | ORAL | 1 refills | Status: DC

## 2022-12-20 MED ORDER — FERROUS SULFATE 325 (65 FE) MG PO TBEC: 325.0000 mg | DELAYED_RELEASE_TABLET | Freq: Two times a day (BID) | ORAL | 1 refills | Status: AC

## 2022-12-20 MED ORDER — ESCITALOPRAM OXALATE 10 MG PO TABS
10.0000 mg | ORAL_TABLET | Freq: Every day | ORAL | 1 refills | Status: DC
Start: 2022-12-20 — End: 2023-06-19

## 2022-12-20 NOTE — Progress Notes (Signed)
Subjective:    Patient ID: Robin [name], female    DOB: October 13, 1940, 82 y.o.   MRN: [id number]   Chief Complaint: medical management of chronic issues     HPI:  Robin [name] is a 82 y.o. who identifies as a female who was assigned female at birth.   Social history: Lives with: daughter Work history: retired   Water engineer in today for follow up of the following chronic medical issues:  1. Essential hypertension No c/o chest pain, sob or headache. Does not check blood pressure at home. BP Readings from Last 3 Encounters:  06/20/22 (!) 85/66  01/25/22 139/77  12/20/21 127/77     2. Mixed hyperlipidemia Has a very poor appetite. Does no exercise Lab Results  Component Value Date   CHOL 241 (H) 06/20/2022   HDL 60 06/20/2022   LDLCALC 165 (H) 06/20/2022   TRIG 93 06/20/2022   CHOLHDL 4.0 06/20/2022     3. Hyperkalemia Lab Results  Component Value Date   K 4.4 06/20/2022     4. Hypercalcemia Lab Results  Component Value Date   CALCIUM 9.7 06/20/2022   PHOS 3.0 12/06/2021   PHOS 3.0 12/06/2021     5. Age related osteoporosis, unspecified pathological fracture presence Last dexascan was done on 02/03/21. T score was -3.2. she is on prolia injections. Will repeat dexascan today.  6. Vitamin D insufficiency Is on daily vitamin d supplement  7. Underweight Weight is down 6 lbs. She says she eats a lot and drinkd 2 ensures a day.  Wt Readings from Last 3 Encounters:  12/20/22 88 lb (39.9 kg)  06/20/22 94 lb (42.6 kg)  01/25/22 94 lb (42.6 kg)   BMI Readings from Last 3 Encounters:  12/20/22 16.10 kg/m  06/20/22 17.19 kg/m  01/25/22 17.19 kg/m      New complaints: None today  No Known Allergies Outpatient Encounter Medications as of 12/20/2022  Medication Sig   aspirin EC 81 MG tablet Take 81 mg by mouth daily.   atorvastatin (LIPITOR) 80 MG tablet Take 1 tablet (80 mg total) by mouth daily.   Cholecalciferol (VITAMIN D) 2000 UNITS tablet  Take 4000IU on saturdays and sundays and 2000IU all other days. (Patient taking differently: Take 2,000 Units by mouth See admin instructions. Take 1 tablet (2000 units) on Mondays through Fridays and take 2 tablets (4000 units) by mouth on Saturdays & Sundays.)   cyanocobalamin 1000 MCG tablet Take 1 tablet (1,000 mcg total) by mouth daily.   denosumab (PROLIA) 60 MG/ML SOSY injection BRING TO OFFICE FOR ADMINISTRATION. ADMINISTER IN UPPER ARM, THIGH OR ABDOMEN   ENSURE PLUS (ENSURE PLUS) LIQD Take 1 Can by mouth daily. Dx:  R63.6 (underweight)   escitalopram (LEXAPRO) 10 MG tablet Take 1 tablet (10 mg total) by mouth daily.   ferrous sulfate 325 (65 FE) MG EC tablet Take 1 tablet (325 mg total) by mouth 2 (two) times daily.   senna-docusate (SENOKOT-S) 8.6-50 MG tablet Take 1 tablet by mouth 2 (two) times daily between meals as needed for moderate constipation.   No facility-administered encounter medications on file as of 12/20/2022.    Past Surgical History:  Procedure Laterality Date   BOWEL RESECTION Right 12/01/2021   Procedure: SMALL BOWEL RESECTION;  Surgeon: Lucretia Roers, MD;  Location: [facility];  Service: General;  Laterality: Right;   BREAST EXCISIONAL BIOPSY Left    benign   COLON RESECTION  02/21/2006   of recurrent spreading tubulovillous adenoma  distal to ileocolonic anastomosis   COLONOSCOPY  05/22/2005   Dr. Jarold Motto: 5cm circumferential fungating tumor at the hepatic flexure, path tubulovillous adenoma.    COLONOSCOPY  01/02/2006   Dr. 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 Multiple polyps around anastomosis of different sizes,    COLONOSCOPY  02/21/2006   Dr. Jarold Motto. Referred to Dr. Corliss Skains for resection   COLONOSCOPY N/A 07/18/2014   Procedure: COLONOSCOPY;  Surgeon: Corbin Ade, MD;  Location: [facility];  Service: Endoscopy;  Laterality: N/A;  1230   COLONOSCOPY WITH PROPOFOL N/A 04/22/2020   Procedure:  COLONOSCOPY WITH PROPOFOL;  Surgeon: Malissa Hippo, MD;  Location: [facility];  Service: Endoscopy;  Laterality: N/A;  am   EYE SURGERY     FEMORAL HERNIA REPAIR Right 12/01/2021   Procedure: HERNIA REPAIR FEMORAL, McVay Repair;  Surgeon: Lucretia Roers, MD;  Location: [facility];  Service: General;  Laterality: Right;   LAPAROSCOPIC LYSIS OF ADHESIONS     PARTIAL COLECTOMY  02/21/2005   due to tubulovillous adenoma   TUBAL LIGATION      Family History  Problem Relation Age of Onset   Diabetes Mother    Heart disease Mother    Cancer Mother        patient unsure of type   Hip fracture Mother    Heart disease Father        antigioplasty   Cancer Sister        lung   COPD Sister    Heart disease Sister    Atrial fibrillation Sister    Heart disease Sister        stent in left leg   Varicose Veins Sister    Deep vein thrombosis Sister    Heart disease Brother    COPD Brother    COPD Brother    Huntington's disease Daughter    Carpal tunnel syndrome Daughter    Brain cancer Son    Colon cancer Neg Hx       Controlled substance contract: n/a     Review of Systems  Constitutional:  Negative for diaphoresis.  Eyes:  Negative for pain.  Respiratory:  Negative for shortness of breath.   Cardiovascular:  Negative for chest pain, palpitations and leg swelling.  Gastrointestinal:  Negative for abdominal pain.  Endocrine: Negative for polydipsia.  Skin:  Negative for rash.  Neurological:  Negative for dizziness, weakness and headaches.  Hematological:  Does not bruise/bleed easily.  All other systems reviewed and are negative.      Objective:   Physical Exam Vitals and nursing note reviewed.  Constitutional:      General: She is not in acute distress.    Appearance: Normal appearance. She is well-developed.  HENT:     Head: Normocephalic.     Right Ear: Tympanic membrane normal.     Left Ear: Tympanic membrane normal.     Nose: Nose normal.      Mouth/Throat:     Mouth: Mucous membranes are moist.  Eyes:     Pupils: Pupils are equal, round, and reactive to light.  Neck:     Vascular: No carotid bruit or JVD.  Cardiovascular:     Rate and Rhythm: Normal rate and regular rhythm.     Heart sounds: Normal heart sounds.  Pulmonary:     Effort: Pulmonary effort is normal. No respiratory distress.     Breath sounds: Normal breath sounds. No wheezing or rales.  Chest:  Chest wall: No tenderness.  Abdominal:     General: Bowel sounds are normal. There is no distension or abdominal bruit.     Palpations: Abdomen is soft. There is no hepatomegaly, splenomegaly, mass or pulsatile mass.     Tenderness: There is no abdominal tenderness.  Musculoskeletal:        General: Normal range of motion.     Cervical back: Normal range of motion and neck supple.  Lymphadenopathy:     Cervical: No cervical adenopathy.  Skin:    General: Skin is warm and dry.  Neurological:     Mental Status: She is alert and oriented to person, place, and time.     Deep Tendon Reflexes: Reflexes are normal and symmetric.  Psychiatric:        Behavior: Behavior normal.        Thought Content: Thought content normal.        Judgment: Judgment normal.    BP 139/81   Pulse 63   Temp 98.2 F (36.8 C) (Temporal)   Resp 20   Ht 5\' 2"  (1.575 m)   Wt 88 lb (39.9 kg)   SpO2 90%   BMI 16.10 kg/m    Chest xray- unchanged from previous-Preliminary reading by Paulene Floor, FNP  [facility]      Assessment & Plan:  Robin [name] comes in today with chief complaint of Medical Management of Chronic Issues   Diagnosis and orders addressed:  1. Essential hypertension Low sodium diet  2. Mixed hyperlipidemia Low fat diet - atorvastatin (LIPITOR) 80 MG tablet; Take 1 tablet (80 mg total) by mouth daily.  Dispense: 90 tablet; Refill: 1  3. Hyperkalemia Labs pending  4. Hypercalcemia Labs pending  5. Age related osteoporosis, unspecified pathological  fracture presence Weight bearing exercises  6. Vitamin D insufficiency  7. Underweight Increase calories in diet' make sure eats 3 meals and 3  snacks a day - DG Chest 2 View  8. GAD (generalized anxiety disorder) Stress management - escitalopram (LEXAPRO) 10 MG tablet; Take 1 tablet (10 mg total) by mouth daily.  Dispense: 90 tablet; Refill: 1   Labs pending Health Maintenance reviewed Diet and exercise encouraged  Follow up plan: 6 months   Mary-Margaret Daphine Deutscher, FNP

## 2022-12-20 NOTE — Patient Instructions (Signed)
High-Protein and High-Calorie Diet Eating high-protein and high-calorie foods can help you to gain weight, heal after an injury, and recover after an illness or surgery. The specific amount of daily protein and calories you need depends on: Your body weight. The reason this diet is recommended for you. Generally, a high-protein, high-calorie diet involves: Eating 250-500 extra calories each day. Making sure that you get enough of your daily calories from protein. Ask your health care provider how many of your calories should come from protein. Talk with a health care provider or a dietitian about how much protein and how many calories you need each day. Follow the diet as directed by your health care provider. What are tips for following this plan?  Reading food labels Check the nutrition facts label for calories, grams of fat and protein. Items with more than 4 grams of protein are high-protein foods. Preparing meals Add whole milk, half-and-half, or heavy cream to cereal, pudding, soup, or hot cocoa. Add whole milk to instant breakfast drinks. Add peanut butter to oatmeal or smoothies. Add powdered milk to baked goods, smoothies, or milkshakes. Add powdered milk, cream, or butter to mashed potatoes. Add cheese to cooked vegetables. Make whole-milk yogurt parfaits. Top them with granola, fruit, or nuts. Add cottage cheese to fruit. Add avocado, cheese, or both to sandwiches or salads. Add avocado to smoothies. Add meat, poultry, or seafood to rice, pasta, casseroles, salads, and soups. Use mayonnaise when making egg salad, chicken salad, or tuna salad. Use peanut butter as a dip for fruits and vegetables or as a topping for pretzels, celery, or crackers. Add beans to casseroles, dips, and spreads. Add pureed beans to sauces and soups. Replace calorie-free drinks with calorie-containing drinks, such as milk and fruit juice. Replace water with milk or heavy cream when making foods such as  oatmeal, pudding, or cocoa. Add oil or butter to cooked vegetables and grains. Add cream cheese to sandwiches or as a topping on crackers and bread. Make cream-based pastas and soups. General information Ask your health care provider if you should take a nutritional supplement. Try to eat six small meals each day instead of three large meals. A general goal is to eat every 2 to 3 hours. Eat a balanced diet. In each meal, include one food that is high in protein and one food with fat in it. Keep nutritious snacks available, such as nuts, trail mixes, dried fruit, and yogurt. If you have kidney disease or diabetes, talk with your health care provider about how much protein is safe for you. Too much protein may put extra stress on your kidneys. Drink your calories. Choose high-calorie drinks and have them after your meals. Consider setting a timer to remind you to eat. You will want to eat even if you do not feel very hungry. What high-protein foods should I eat?  Vegetables Soybeans. Peas. Grains Quinoa. Bulgur wheat. Buckwheat. Meats and other proteins Beef, pork, and poultry. Fish and seafood. Eggs. Tofu. Textured vegetable protein (TVP). Peanut butter. Nuts and seeds. Dried beans. Protein powders. Hummus. Dairy Whole milk. Whole-milk yogurt. Powdered milk. Cheese. Cottage Cheese. Eggnog. Beverages High-protein supplement drinks. Soy milk. Other foods Protein bars. The items listed above may not be a complete list of foods and beverages you can eat and drink. Contact a dietitian for more information. What high-calorie foods should I eat? Fruits Dried fruit. Fruit leather. Canned fruit in syrup. Fruit juice. Avocado. Vegetables Vegetables cooked in oil or butter. Fried potatoes. Grains   Pasta. Quick breads. Muffins. Pancakes. Ready-to-eat cereal. Meats and other proteins Peanut butter. Nuts and seeds. Dairy Heavy cream. Whipped cream. Cream cheese. Sour cream. Ice cream. Custard.  Pudding. Whole milk dairy products. Beverages Meal-replacement beverages. Nutrition shakes. Fruit juice. Seasonings and condiments Salad dressing. Mayonnaise. Alfredo sauce. Fruit preserves or jelly. Honey. Syrup. Sweets and desserts Cake. Cookies. Pie. Pastries. Candy bars. Chocolate. Fats and oils Butter or margarine. Oil. Gravy. Other foods Meal-replacement bars. The items listed above may not be a complete list of foods and beverages you can eat and drink. Contact a dietitian for more information. Summary A high-protein, high-calorie diet can help you gain weight or heal faster after an injury, illness, or surgery. To increase your protein and calories, add ingredients such as whole milk, peanut butter, cheese, beans, meat, or seafood to meal items. To get enough extra calories each day, include high-calorie foods and beverages at each meal. Adding a high-calorie drink or shake can be an easy way to help you get enough calories each day. Talk with your healthcare provider or dietitian about the best options for you. This information is not intended to replace advice given to you by your health care provider. Make sure you discuss any questions you have with your health care provider. Document Revised: 01/10/2020 Document Reviewed: 01/12/2020 Elsevier Patient Education  2024 Elsevier Inc.  

## 2022-12-29 ENCOUNTER — Ambulatory Visit: Payer: 59

## 2022-12-29 VITALS — Ht 62.0 in | Wt 88.0 lb

## 2022-12-29 DIAGNOSIS — Z0000 Encounter for general adult medical examination without abnormal findings: Secondary | ICD-10-CM | POA: Diagnosis not present

## 2022-12-29 NOTE — Patient Instructions (Signed)
Robin [name] , Thank you for taking time to come for your Medicare Wellness Visit. I appreciate your ongoing commitment to your health goals. Please review the following plan we discussed and let me know if I can assist you in the future.   Referrals/Orders/Follow-Ups/Clinician Recommendations: Aim for 30 minutes of exercise or brisk walking, 6-8 glasses of water, and 5 servings of fruits and vegetables each day.   This is a list of the screening recommended for you and due dates:  Health Maintenance  Topic Date Due   COVID-19 Vaccine (3 - Moderna risk series) 01/05/2023*   Zoster (Shingles) Vaccine (1 of 2) 03/22/2023*   Mammogram  06/20/2023*   DEXA scan (bone density measurement)  02/05/2023   Medicare Annual Wellness Visit  12/29/2023   DTaP/Tdap/Td vaccine (2 - Td or Tdap) 05/29/2024   Colon Cancer Screening  04/22/2025   Pneumonia Vaccine  Completed   Flu Shot  Completed   HPV Vaccine  Aged Out   Hepatitis C Screening  Discontinued  *Topic was postponed. The date shown is not the original due date.    Advanced directives: (In Chart) A copy of your advanced directives are scanned into your chart should your provider ever need it.  Next Medicare Annual Wellness Visit scheduled for next year: Yes  Insert Preventive Care attachment Insert FALL PREVENTION attachment if needed

## 2022-12-29 NOTE — Progress Notes (Signed)
Subjective:   Robin [name] is a 82 y.o. female who presents for Medicare Annual (Subsequent) preventive examination.  Visit Complete: Virtual I connected with  Robin [name] on 12/29/22 by a audio enabled telemedicine application and verified that I am speaking with the correct person using two identifiers.  Patient Location: Home  Provider Location: Home Office  I discussed the limitations of evaluation and management by telemedicine. The patient expressed understanding and agreed to proceed.  Vital Signs: Because this visit was a virtual/telehealth visit, some criteria may be missing or patient reported. Any vitals not documented were not able to be obtained and vitals that have been documented are patient reported.  Patient Medicare AWV questionnaire was completed by the patient on 12/29/2022; I have confirmed that all information answered by patient is correct and no changes since this date.  Cardiac Risk Factors include: advanced age (>48men, >69 women);dyslipidemia;hypertension     Objective:    Today's Vitals   12/29/22 0902  Weight: 88 lb (39.9 kg)  Height: 5\' 2"  (1.575 m)   Body mass index is 16.1 kg/m.     12/29/2022    9:05 AM 12/27/2021   11:39 AM 12/01/2021    9:29 AM 11/30/2021   11:17 PM 11/30/2021   11:05 PM 11/30/2021    3:29 PM 07/05/2021   12:57 PM  Advanced Directives  Does Patient Have a Medical Advance Directive? Yes Yes Yes Yes No;Yes Yes Yes  Type of Estate agent of [place];Living will Healthcare Power of [place];Living will  Healthcare Power of [place];Living will  Living will;Healthcare Power of Attorney   Does patient want to make changes to medical advance directive?  No - Patient declined  No - Patient declined     Copy of Healthcare Power of Attorney in Chart? No - copy requested Yes - validated most recent copy scanned in chart (See row information)  No - copy requested     Would patient like information on  creating a medical advance directive?  No - Patient declined No - Patient declined  No - Patient declined      Current Medications (verified) Outpatient Encounter Medications as of 12/29/2022  Medication Sig   aspirin EC 81 MG tablet Take 81 mg by mouth daily.   atorvastatin (LIPITOR) 80 MG tablet Take 1 tablet (80 mg total) by mouth daily.   Cholecalciferol (VITAMIN D) 2000 UNITS tablet Take 4000IU on saturdays and sundays and 2000IU all other days. (Patient taking differently: Take 2,000 Units by mouth See admin instructions. Take 1 tablet (2000 units) on Mondays through Fridays and take 2 tablets (4000 units) by mouth on Saturdays & Sundays.)   cyanocobalamin 1000 MCG tablet Take 1 tablet (1,000 mcg total) by mouth daily.   denosumab (PROLIA) 60 MG/ML SOSY injection BRING TO OFFICE FOR ADMINISTRATION. ADMINISTER IN UPPER ARM, THIGH OR ABDOMEN   ENSURE PLUS (ENSURE PLUS) LIQD Take 1 Can by mouth daily. Dx:  R63.6 (underweight)   escitalopram (LEXAPRO) 10 MG tablet Take 1 tablet (10 mg total) by mouth daily.   ferrous sulfate 325 (65 FE) MG EC tablet Take 1 tablet (325 mg total) by mouth 2 (two) times daily.   senna-docusate (SENOKOT-S) 8.6-50 MG tablet Take 1 tablet by mouth 2 (two) times daily between meals as needed for moderate constipation.   No facility-administered encounter medications on file as of 12/29/2022.    Allergies (verified) Patient has no known allergies.   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 Jarold Motto: 5cm circumferential fungating tumor at the hepatic flexure, path tubulovillous adenoma.    COLONOSCOPY  01/02/2006   Dr. 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 Multiple polyps around anastomosis of different sizes,    COLONOSCOPY  02/21/2006   Dr. Jarold Motto. Referred to Dr. Corliss Skains for resection   COLONOSCOPY N/A 07/18/2014   Procedure: COLONOSCOPY;  Surgeon: Corbin Ade, MD;  Location: [facility];  Service: Endoscopy;  Laterality: N/A;  1230   COLONOSCOPY WITH PROPOFOL N/A 04/22/2020   Procedure: COLONOSCOPY WITH PROPOFOL;  Surgeon: Malissa Hippo, MD;  Location: [facility];  Service: Endoscopy;  Laterality: N/A;  am   EYE SURGERY     FEMORAL HERNIA REPAIR Right 12/01/2021   Procedure: HERNIA REPAIR FEMORAL, [place] Repair;  Surgeon: Lucretia Roers, MD;  Location: [facility];  Service: General;  Laterality: Right;   LAPAROSCOPIC LYSIS OF ADHESIONS     PARTIAL COLECTOMY  02/21/2005   due to tubulovillous adenoma   TUBAL LIGATION     Family History  Problem Relation Age of Onset   Diabetes Mother    Heart disease Mother    Cancer Mother        patient unsure of type   Hip fracture Mother    Heart disease Father        antigioplasty   Cancer Sister        lung   COPD Sister    Heart disease Sister    Atrial fibrillation Sister    Heart disease Sister        stent in left leg   Varicose Veins Sister    Deep vein thrombosis Sister    Heart disease Brother    COPD Brother    COPD Brother    Huntington's disease Daughter    Carpal tunnel syndrome Daughter    Brain cancer Son    Colon cancer Neg Hx    Social History   Socioeconomic History   Marital status: Widowed    Spouse name: Not on file   Number of children: 6   Years of education: Not on file   Highest  education level: Not on file  Occupational History   Occupation: retired  Tobacco Use   Smoking status: Former    Current packs/day: 0.00    Types: Cigarettes    Start date: 11/01/1958    Quit date: 11/01/1959    Years since quitting: 63.2   Smokeless tobacco: Never  Vaping Use   Vaping status: Never Used  Substance and Sexual Activity   Alcohol use: No    Alcohol/week: 0.0 standard drinks of alcohol   Drug use: No   Sexual activity: Never  Other Topics Concern   Not on file  Social History Narrative  Her son lives with her   Lives in apartment with elevator   Had 6 children - 3 sons, 3 daughters, one son passed at age 23   Social Determinants of Health   Financial Resource Strain: Low Risk  (12/29/2022)   Overall Financial Resource Strain (CARDIA)    Difficulty of Paying Living Expenses: Not hard at all  Food Insecurity: No Food Insecurity (12/29/2022)   Hunger Vital Sign    Worried About Running Out of Food in the Last Year: Never true    Ran Out of Food in the Last Year: Never true  Transportation Needs: No Transportation Needs (12/29/2022)   PRAPARE - Administrator, Civil Service (Medical): No    Lack of Transportation (Non-Medical): No  Physical Activity: Inactive (12/29/2022)   Exercise Vital Sign    Days of Exercise per Week: 0 days    Minutes of Exercise per Session: 0 min  Stress: No Stress Concern Present (12/29/2022)   [employer] of Occupational Health - Occupational Stress Questionnaire    Feeling of Stress : Not at all  Social Connections: Moderately Isolated (12/29/2022)   Social Connection and Isolation Panel [NHANES]    Frequency of Communication with Friends and Family: More than three times a week    Frequency of Social Gatherings with Friends and Family: More than three times a week    Attends Religious Services: More than 4 times per year    Active Member of [employer] or Organizations: No    Attends Banker Meetings: Never     Marital Status: Widowed    Tobacco Counseling Counseling given: Not Answered   Clinical Intake:  Pre-visit preparation completed: Yes  Pain : No/denies pain     Nutritional Risks: None Diabetes: No  How often do you need to have someone help you when you read instructions, pamphlets, or other written materials from your doctor or pharmacy?: 1 - Never  Interpreter Needed?: No  Information entered by :: Renie Ora, LPN   Activities of Daily Living    12/29/2022    9:05 AM  In your present state of health, do you have any difficulty performing the following activities:  Hearing? 0  Vision? 0  Difficulty concentrating or making decisions? 0  Walking or climbing stairs? 0  Dressing or bathing? 0  Doing errands, shopping? 0  Preparing Food and eating ? N  Using the Toilet? N  In the past six months, have you accidently leaked urine? N  Do you have problems with loss of bowel control? N  Managing your Medications? N  Managing your Finances? N  Housekeeping or managing your Housekeeping? N    Patient Care Team: Bennie Pierini, FNP as PCP - General (Nurse Practitioner) Corbin Ade, MD as Consulting Physician (Gastroenterology)  Indicate any recent Medical Services you may have received from other than [facility] providers in the past year (date may be approximate).     Assessment:   This is a routine wellness examination for Robin [name].  Hearing/Vision screen Vision Screening - Comments:: Wears rx glasses - up to date with routine eye exams with  Dr.johnson    Goals Addressed             This Visit's Progress    DIET - EAT MORE FRUITS AND VEGETABLES   On track      Depression Screen    12/29/2022    9:04 AM 12/20/2022   10:06 AM 06/20/2022    1:55 PM  12/27/2021   11:38 AM 12/20/2021    1:51 PM 12/14/2021   11:49 AM 11/09/2021    1:50 PM  PHQ 2/9 Scores  PHQ - 2 Score 0 0 0 0 0 0 0  PHQ- 9 Score 0 0 0 0 1 2     Fall Risk    12/29/2022    9:03  AM 12/20/2022   10:06 AM 06/20/2022    1:55 PM 12/27/2021   11:37 AM 12/20/2021    1:51 PM  Fall Risk   Falls in the past year? 0 0 1 0 0  Number falls in past yr: 0  1 0   Injury with Fall? 0  0 0   Risk for fall due to : No Fall Risks  History of fall(s) No Fall Risks   Follow up Falls prevention discussed  Education provided Falls prevention discussed     MEDICARE RISK AT HOME: Medicare Risk at Home Any stairs in or around the home?: Yes If so, are there any without handrails?: No Home free of loose throw rugs in walkways, pet beds, electrical cords, etc?: Yes Adequate lighting in your home to reduce risk of falls?: Yes Life alert?: No Use of a cane, walker or w/c?: No Grab bars in the bathroom?: Yes Shower chair or bench in shower?: Yes Elevated toilet seat or a handicapped toilet?: Yes  TIMED UP AND GO:  Was the test performed?  No    Cognitive Function:    06/09/2016   10:28 AM 06/08/2015    2:32 PM 12/04/2014   10:40 AM 05/30/2014   12:59 PM  MMSE - Mini Mental State Exam  Orientation to time 5 5 5 5   Orientation to Place 5 5 5 5   Registration 3 3 3 3   Attention/ Calculation 5 4 5 5   Recall 3 3 3 3   Language- name 2 objects 2 2 2 2   Language- repeat 1 1 1 1   Language- follow 3 step command 3 3 3 3   Language- read & follow direction 1 1 1 1   Write a sentence 1 1 1 1   Copy design 1 1 1 1   Total score 30 29 30 30         12/29/2022    9:05 AM 12/27/2021   11:41 AM  6CIT Screen  What Year? 0 points 0 points  What month? 0 points 0 points  What time? 0 points 0 points  Count back from 20 0 points 0 points  Months in reverse 0 points 0 points  Repeat phrase 0 points 0 points  Total Score 0 points 0 points    Immunizations Immunization History  Administered Date(s) Administered   Fluad Quad(high Dose 65+) 11/18/2019, 12/21/2020, 12/02/2021   Fluad Trivalent(High Dose 65+) 12/20/2022   Influenza, High Dose Seasonal PF 12/06/2016, 12/20/2017   Influenza,  Quadrivalent, Recombinant, Inj, Pf 12/10/2018   Influenza,inj,Quad PF,6+ Mos 12/05/2012, 11/28/2013, 12/04/2014, 11/06/2018   Moderna SARS-COV2 Booster Vaccination 01/11/2020   Moderna Sars-Covid-2 Vaccination 04/18/2019, 05/17/2019   Pneumococcal Conjugate-13 05/30/2014   Pneumococcal Polysaccharide-23 12/05/2012   Tdap 05/30/2014   Zoster, Live 10/03/2014    TDAP status: Up to date  Flu Vaccine status: Up to date  Pneumococcal vaccine status: Up to date  Covid-19 vaccine status: Completed vaccines  Qualifies for Shingles Vaccine? Yes   Zostavax completed No   Shingrix Completed?: No.    Education has been provided regarding the importance of this vaccine. Patient has been advised to call insurance  company to determine out of pocket expense if they have not yet received this vaccine. Advised may also receive vaccine at local pharmacy or Health Dept. Verbalized acceptance and understanding.  Screening Tests Health Maintenance  Topic Date Due   COVID-19 Vaccine (3 - Moderna risk series) 01/05/2023 (Originally 02/08/2020)   Zoster Vaccines- Shingrix (1 of 2) 03/22/2023 (Originally 09/20/1959)   MAMMOGRAM  06/20/2023 (Originally 01/20/2022)   DEXA SCAN  02/05/2023   Medicare Annual Wellness (AWV)  12/29/2023   DTaP/Tdap/Td (2 - Td or Tdap) 05/29/2024   Colonoscopy  04/22/2025   Pneumonia Vaccine 38+ Years old  Completed   INFLUENZA VACCINE  Completed   HPV VACCINES  Aged Out   Hepatitis C Screening  Discontinued    Health Maintenance  There are no preventive care reminders to display for this patient.   Colorectal cancer screening: No longer required.   Mammogram status: No longer required due to age.  Bone Density status: Completed 02/04/2021. Results reflect: Bone density results: OSTEOPOROSIS. Repeat every 2 years.  Lung Cancer Screening: (Low Dose CT Chest recommended if Age 33-80 years, 20 pack-year currently smoking OR have quit w/in 15years.) does not qualify.    Lung Cancer Screening Referral: n/a  Additional Screening:  Hepatitis C Screening: does not qualify; Completed 11/18/2019  Vision Screening: Recommended annual ophthalmology exams for early detection of glaucoma and other disorders of the eye. Is the patient up to date with their annual eye exam?  Yes  Who is the provider or what is the name of the office in which the patient attends annual eye exams? Dr.Johnson  If pt is not established with a provider, would they like to be referred to a provider to establish care? No .   Dental Screening: Recommended annual dental exams for proper oral hygiene   Community Resource Referral / Chronic Care Management: CRR required this visit?  No   CCM required this visit?  No     Plan:     I have personally reviewed and noted the following in the patient's chart:   Medical and social history Use of alcohol, tobacco or illicit drugs  Current medications and supplements including opioid prescriptions. Patient is not currently taking opioid prescriptions. Functional ability and status Nutritional status Physical activity Advanced directives List of other physicians Hospitalizations, surgeries, and ER visits in previous 12 months Vitals Screenings to include cognitive, depression, and falls Referrals and appointments  In addition, I have reviewed and discussed with patient certain preventive protocols, quality metrics, and best practice recommendations. A written personalized care plan for preventive services as well as general preventive health recommendations were provided to patient.     Lorrene Reid, LPN   16/02/958   After Visit Summary: (MyChart) Due to this being a telephonic visit, the after visit summary with patients personalized plan was offered to patient via MyChart   Nurse Notes: none

## 2023-01-27 ENCOUNTER — Encounter: Payer: Self-pay | Admitting: Neurology

## 2023-01-27 DIAGNOSIS — M79641 Pain in right hand: Secondary | ICD-10-CM | POA: Diagnosis not present

## 2023-01-27 DIAGNOSIS — M79642 Pain in left hand: Secondary | ICD-10-CM | POA: Diagnosis not present

## 2023-01-27 DIAGNOSIS — G5603 Carpal tunnel syndrome, bilateral upper limbs: Secondary | ICD-10-CM | POA: Diagnosis not present

## 2023-01-30 ENCOUNTER — Other Ambulatory Visit: Payer: Self-pay

## 2023-01-30 DIAGNOSIS — R202 Paresthesia of skin: Secondary | ICD-10-CM

## 2023-02-07 ENCOUNTER — Other Ambulatory Visit: Payer: 59

## 2023-03-02 DIAGNOSIS — M5432 Sciatica, left side: Secondary | ICD-10-CM | POA: Diagnosis not present

## 2023-03-06 ENCOUNTER — Ambulatory Visit (INDEPENDENT_AMBULATORY_CARE_PROVIDER_SITE_OTHER): Payer: 59 | Admitting: Neurology

## 2023-03-06 DIAGNOSIS — M5412 Radiculopathy, cervical region: Secondary | ICD-10-CM | POA: Diagnosis not present

## 2023-03-06 DIAGNOSIS — G5602 Carpal tunnel syndrome, left upper limb: Secondary | ICD-10-CM

## 2023-03-06 DIAGNOSIS — R202 Paresthesia of skin: Secondary | ICD-10-CM | POA: Diagnosis not present

## 2023-03-06 NOTE — Procedures (Signed)
 [facility]  [address], [address]  [place], [place] [address] Tel: [phone] Fax: [phone] Test Date:  03/06/2023  Patient: Robin [name] DOB: 1940/04/04 Physician: Venetia Potters, MD  Sex: Female Height: 5' 2 Ref Phys: Manus Gobble, MD  ID#: [id number]   Technician:    History: This is a 83 year old female with bilateral hand pain.  NCV & EMG Findings: Extensive electrodiagnostic evaluation of bilateral upper limbs shows: Left median sensory response shows showed prolonged distal peak latency (4.0 ms). Right median, bilateral ulnar, and bilateral radial sensory responses are within normal limits. Right median (APB) motor response shows reduced amplitude (4.4 mV). Bilateral ulnar (ADM) motor responses show reduced amplitudes (L5.6, R5.1 mV). Chronic motor axon loss changes without accompanying active denervation changes are seen in bilateral first dorsal interosseous, bilateral extensor indicis proprius, and right abductor pollicis brevis muscles.    Impression: This is an abnormal study. 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  Evidence of a left median mononeuropathy at or distal to the wrist, consistent with carpal tunnel syndrome, mild in degree electrically. No definitive electrodiagnostic evidence of a right median mononeuropathy at or distal to the wrist (ie: carpal tunnel syndrome).    ___________________________ Venetia Potters, MD    Nerve Conduction Studies Motor Nerve Results    Latency Amplitude F-Lat Segment Distance CV Comment  Site (ms) Norm (mV) Norm (ms)  (cm) (m/s) Norm   Left Median (APB) Motor  Wrist 2.3  < 4.0 7.3  > 5.0        Elbow 7.3 - 7.0 -  Elbow-Wrist 27 54  > 50   Right Median (APB) Motor  Wrist 2.5  < 4.0 *4.4  > 5.0        Elbow 7.2 - 4.2 -  Elbow-Wrist 26 55  > 50    Left Ulnar (ADM) Motor  Wrist 2.1  < 3.1 *5.6  > 7.0        Bel elbow 5.3 - 4.5 -  Bel elbow-Wrist 18 56  > 50   Ab elbow 7.2 - 4.3 -  Ab elbow-Bel elbow 10 53 -   Right Ulnar (ADM) Motor  Wrist 2.4  < 3.1 *5.1  > 7.0        Bel elbow 6.0 - 4.6 -  Bel elbow-Wrist 19 53  > 50   Ab elbow 7.9 - 4.5 -  Ab elbow-Bel elbow 10 53 -    Sensory Sites    Neg Peak Lat Amplitude (O-P) Segment Distance Velocity Comment  Site (ms) Norm (V) Norm  (cm) (ms)   Left Median Sensory  Wrist-Dig II *4.0  < 3.8 11  > 10 Wrist-Dig II 13    Right Median Sensory  Wrist-Dig II 3.4  < 3.8 13  > 10 Wrist-Dig II 13    Left Radial Sensory  Forearm-Wrist 1.98  < 2.8 10  > 10 Forearm-Wrist 10    Right Radial Sensory  Forearm-Wrist 2.7  < 2.8 12  > 10 Forearm-Wrist 10    Left Ulnar Sensory  Wrist-Dig V 3.1  < 3.2 8  > 5 Wrist-Dig V 11    Right Ulnar Sensory  Wrist-Dig V 3.1  < 3.2 13  > 5 Wrist-Dig V 11     Electromyography   Side Muscle Ins.Act Fibs Fasc Recrt Amp Dur Poly  Activation Comment  Right FDI Nml Nml Nml *1- *1+ *1+ Nml Nml N/A  Right EIP Nml Nml Nml *1- *1+ *1+ Nml Nml N/A  Right Pronator teres Nml Nml Nml Nml Nml Nml Nml Nml N/A  Right APB Nml Nml Nml *2- *1+ *1+ Nml Nml N/A  Right Biceps Nml Nml Nml Nml Nml Nml Nml Nml N/A  Right Triceps Nml Nml Nml Nml Nml Nml Nml Nml N/A  Right Deltoid Nml Nml Nml Nml Nml Nml Nml Nml N/A  Left FDI Nml Nml Nml *2- *1+ *1+ Nml Nml N/A  Left EIP Nml Nml Nml *1- *1+ *1+ Nml Nml N/A  Left Pronator teres Nml Nml Nml Nml Nml Nml Nml Nml N/A  Left APB Nml Nml Nml Nml Nml Nml Nml Nml N/A  Left Biceps Nml Nml Nml Nml Nml Nml Nml Nml N/A  Left Triceps Nml Nml Nml Nml Nml Nml Nml Nml N/A  Left Deltoid Nml Nml Nml Nml Nml Nml Nml Nml N/A  Left C7 PSP Nml Nml Nml Nml Nml Nml Nml Nml N/A  Right C7 PSP Nml Nml Nml Nml Nml Nml Nml Nml N/A      Waveforms:  Motor           Sensory

## 2023-03-24 DIAGNOSIS — G5602 Carpal tunnel syndrome, left upper limb: Secondary | ICD-10-CM | POA: Diagnosis not present

## 2023-03-24 DIAGNOSIS — M47812 Spondylosis without myelopathy or radiculopathy, cervical region: Secondary | ICD-10-CM | POA: Diagnosis not present

## 2023-03-24 DIAGNOSIS — M4722 Other spondylosis with radiculopathy, cervical region: Secondary | ICD-10-CM | POA: Diagnosis not present

## 2023-03-24 DIAGNOSIS — M50322 Other cervical disc degeneration at C5-C6 level: Secondary | ICD-10-CM | POA: Diagnosis not present

## 2023-04-11 DIAGNOSIS — M47812 Spondylosis without myelopathy or radiculopathy, cervical region: Secondary | ICD-10-CM | POA: Diagnosis not present

## 2023-04-11 DIAGNOSIS — M50122 Cervical disc disorder at C5-C6 level with radiculopathy: Secondary | ICD-10-CM | POA: Diagnosis not present

## 2023-04-11 DIAGNOSIS — M4722 Other spondylosis with radiculopathy, cervical region: Secondary | ICD-10-CM | POA: Diagnosis not present

## 2023-04-11 DIAGNOSIS — M4802 Spinal stenosis, cervical region: Secondary | ICD-10-CM | POA: Diagnosis not present

## 2023-04-11 DIAGNOSIS — M4852XA Collapsed vertebra, not elsewhere classified, cervical region, initial encounter for fracture: Secondary | ICD-10-CM | POA: Diagnosis not present

## 2023-04-11 DIAGNOSIS — M5030 Other cervical disc degeneration, unspecified cervical region: Secondary | ICD-10-CM | POA: Diagnosis not present

## 2023-04-18 ENCOUNTER — Telehealth: Payer: Self-pay

## 2023-04-18 DIAGNOSIS — M810 Age-related osteoporosis without current pathological fracture: Secondary | ICD-10-CM

## 2023-04-18 MED ORDER — DENOSUMAB 60 MG/ML ~~LOC~~ SOSY
60.0000 mg | PREFILLED_SYRINGE | Freq: Once | SUBCUTANEOUS | Status: AC
Start: 2023-05-02 — End: 2023-06-08
  Administered 2023-06-08: 60 mg via SUBCUTANEOUS

## 2023-04-18 NOTE — Telephone Encounter (Signed)
 Prolia ordered for PA team to review verification benefits

## 2023-04-19 ENCOUNTER — Telehealth: Payer: Self-pay

## 2023-04-19 NOTE — Telephone Encounter (Signed)
 Prolia VOB initiated via [email or web address]  Next Prolia inj DUE: 05/02/23

## 2023-04-24 DIAGNOSIS — R509 Fever, unspecified: Secondary | ICD-10-CM | POA: Diagnosis not present

## 2023-04-24 DIAGNOSIS — Z20822 Contact with and (suspected) exposure to covid-19: Secondary | ICD-10-CM | POA: Diagnosis not present

## 2023-04-24 DIAGNOSIS — R059 Cough, unspecified: Secondary | ICD-10-CM | POA: Diagnosis not present

## 2023-04-26 ENCOUNTER — Other Ambulatory Visit: Payer: Self-pay | Admitting: Nurse Practitioner

## 2023-04-26 DIAGNOSIS — I10 Essential (primary) hypertension: Secondary | ICD-10-CM

## 2023-04-26 NOTE — Telephone Encounter (Signed)
 Copied from CRM [id number]. Topic: Clinical - Medication Refill >> Apr 26, 2023 12:16 PM [place] B wrote: Most Recent Primary Care Visit:  Provider: Lorrene Reid  Department: [facility]  Visit Type: MEDICARE AWV, SEQUENTIAL  Date: 12/29/2022  Medication: blood pressure med, not on list , pt insist she is on bp med  Has the patient contacted their pharmacy? yes ((Agent: If yes, when and what did the pharmacy advise?)contact pcp  Is this the correct pharmacy for this prescription? yes If no, delete pharmacy and type the correct one.    [facility] [address] [place] - [address] [place] [address] Phone: [phone] Fax: [phone]   Has the prescription been filled recently? no  Is the patient out of the medication? yes  Has the patient been seen for an appointment in the last year OR does the patient have an upcoming appointment? yes  Can we respond through MyChart? no  Agent: Please be advised that Rx refills may take up to 3 business days. We ask that you follow-up with your pharmacy.

## 2023-05-01 DIAGNOSIS — M5412 Radiculopathy, cervical region: Secondary | ICD-10-CM | POA: Diagnosis not present

## 2023-05-01 DIAGNOSIS — M47812 Spondylosis without myelopathy or radiculopathy, cervical region: Secondary | ICD-10-CM | POA: Diagnosis not present

## 2023-05-01 DIAGNOSIS — M79641 Pain in right hand: Secondary | ICD-10-CM | POA: Diagnosis not present

## 2023-05-01 DIAGNOSIS — M79642 Pain in left hand: Secondary | ICD-10-CM | POA: Diagnosis not present

## 2023-05-02 ENCOUNTER — Other Ambulatory Visit (HOSPITAL_COMMUNITY): Payer: Self-pay

## 2023-05-04 DIAGNOSIS — R918 Other nonspecific abnormal finding of lung field: Secondary | ICD-10-CM | POA: Diagnosis not present

## 2023-05-04 DIAGNOSIS — I10 Essential (primary) hypertension: Secondary | ICD-10-CM | POA: Diagnosis not present

## 2023-05-04 DIAGNOSIS — R059 Cough, unspecified: Secondary | ICD-10-CM | POA: Diagnosis not present

## 2023-05-04 DIAGNOSIS — Z20822 Contact with and (suspected) exposure to covid-19: Secondary | ICD-10-CM | POA: Diagnosis not present

## 2023-05-05 ENCOUNTER — Other Ambulatory Visit (HOSPITAL_COMMUNITY): Payer: Self-pay

## 2023-05-05 NOTE — Telephone Encounter (Signed)
 Pt ready for scheduling for PROLIA on or after : 05/05/23  Option# 1: Buy/Bill (Office supplied medication)  Out-of-pocket cost due at time of clinic visit: $357  Number of injection/visits approved: ---  Primary: [employer] MEDICARE Prolia co-insurance: 20% Admin fee co-insurance: 20%  Secondary: [place] MEDICAID Prolia co-insurance:  Admin fee co-insurance:   Medical Benefit Details: Date Benefits were checked: 05/01/23 Deductible: $257 Met of $257 Required/ Coinsurance: 20%/ Admin Fee: 20%  Prior Auth: APPROVED PA# [id number] Expiration Date: 02/22/23-02/21/24  # of doses approved: NO LIMIT ----------------------------------------------------------------------- Option# 2- Med Obtained from pharmacy:  Pharmacy benefit: Copay $0 (Paid to pharmacy) Admin Fee: 20% (Pay at clinic)  Prior Auth: N/A PA# Expiration Date:   # of doses approved:   If patient wants fill through the pharmacy benefit please send prescription to: [facility], and include estimated need by date in rx notes. Pharmacy will ship medication directly to the office.  Patient NOT eligible for Prolia Copay Card. Copay Card can make patient's cost as little as $25. Link to apply: [email or web address]://[email or web address].[email or web address]  ** This summary of benefits is an estimation of the patient's out-of-pocket cost. Exact cost may very based on individual plan coverage.

## 2023-05-05 NOTE — Telephone Encounter (Signed)
 Marland Kitchen

## 2023-05-09 ENCOUNTER — Other Ambulatory Visit: Payer: Self-pay

## 2023-05-09 DIAGNOSIS — M810 Age-related osteoporosis without current pathological fracture: Secondary | ICD-10-CM

## 2023-05-09 MED ORDER — DENOSUMAB 60 MG/ML ~~LOC~~ SOSY
60.0000 mg | PREFILLED_SYRINGE | Freq: Once | SUBCUTANEOUS | 0 refills | Status: AC
Start: 2023-05-09 — End: 2023-05-09

## 2023-05-16 DIAGNOSIS — M25552 Pain in left hip: Secondary | ICD-10-CM | POA: Diagnosis not present

## 2023-05-24 DIAGNOSIS — G5603 Carpal tunnel syndrome, bilateral upper limbs: Secondary | ICD-10-CM | POA: Diagnosis not present

## 2023-05-31 ENCOUNTER — Other Ambulatory Visit: Payer: Self-pay

## 2023-05-31 DIAGNOSIS — M810 Age-related osteoporosis without current pathological fracture: Secondary | ICD-10-CM

## 2023-05-31 MED ORDER — DENOSUMAB 60 MG/ML ~~LOC~~ SOSY
60.0000 mg | PREFILLED_SYRINGE | Freq: Once | SUBCUTANEOUS | 0 refills | Status: AC
Start: 2023-05-31 — End: 2023-05-31

## 2023-06-08 ENCOUNTER — Ambulatory Visit (INDEPENDENT_AMBULATORY_CARE_PROVIDER_SITE_OTHER)

## 2023-06-08 DIAGNOSIS — M810 Age-related osteoporosis without current pathological fracture: Secondary | ICD-10-CM

## 2023-06-08 NOTE — Progress Notes (Signed)
 Patient is in office today for a nurse visit for  Prolia injection . Patient injection was given subcutaneously in left thigh. Patient tolerated injection well.

## 2023-06-12 DIAGNOSIS — G5601 Carpal tunnel syndrome, right upper limb: Secondary | ICD-10-CM | POA: Diagnosis not present

## 2023-06-19 ENCOUNTER — Ambulatory Visit (INDEPENDENT_AMBULATORY_CARE_PROVIDER_SITE_OTHER): Payer: 59 | Admitting: Nurse Practitioner

## 2023-06-19 ENCOUNTER — Ambulatory Visit (INDEPENDENT_AMBULATORY_CARE_PROVIDER_SITE_OTHER)

## 2023-06-19 ENCOUNTER — Encounter: Payer: Self-pay | Admitting: Nurse Practitioner

## 2023-06-19 VITALS — BP 139/80 | HR 90 | Temp 98.0°F | Ht 62.0 in | Wt 84.0 lb

## 2023-06-19 DIAGNOSIS — F411 Generalized anxiety disorder: Secondary | ICD-10-CM

## 2023-06-19 DIAGNOSIS — M810 Age-related osteoporosis without current pathological fracture: Secondary | ICD-10-CM | POA: Diagnosis not present

## 2023-06-19 DIAGNOSIS — R636 Underweight: Secondary | ICD-10-CM

## 2023-06-19 DIAGNOSIS — E782 Mixed hyperlipidemia: Secondary | ICD-10-CM | POA: Diagnosis not present

## 2023-06-19 DIAGNOSIS — E559 Vitamin D deficiency, unspecified: Secondary | ICD-10-CM | POA: Diagnosis not present

## 2023-06-19 DIAGNOSIS — I10 Essential (primary) hypertension: Secondary | ICD-10-CM | POA: Diagnosis not present

## 2023-06-19 DIAGNOSIS — E875 Hyperkalemia: Secondary | ICD-10-CM

## 2023-06-19 MED ORDER — ATORVASTATIN CALCIUM 80 MG PO TABS
80.0000 mg | ORAL_TABLET | Freq: Every day | ORAL | 1 refills | Status: DC
Start: 2023-06-19 — End: 2023-09-25

## 2023-06-19 MED ORDER — ESCITALOPRAM OXALATE 10 MG PO TABS: 10.0000 mg | ORAL_TABLET | Freq: Every day | ORAL | 1 refills | Status: DC

## 2023-06-19 NOTE — Progress Notes (Signed)
 Subjective:    Patient ID: Robin [name], female    DOB: 07-25-1940, 83 y.o.   MRN: [id number]   Chief Complaint: medical management of chronic issues     HPI:  Robin [name] is a 83 y.o. who identifies as a female who was assigned female at birth.   Social history: Lives with: daughter Work history: retired   Water engineer in today for follow up of the following chronic medical issues:  1. Essential hypertension No c/o chest pain, sob or headache. Does not check blood pressure at home. BP Readings from Last 3 Encounters:  12/20/22 139/81  06/20/22 (!) 85/66  01/25/22 139/77     2. Mixed hyperlipidemia Does not watch diet and does no dedicated exercise. Takes lipitor daily- may change to crestor if still high at next visit Lab Results  Component Value Date   CHOL 241 (H) 06/20/2022   HDL 60 06/20/2022   LDLCALC 165 (H) 06/20/2022   TRIG 93 06/20/2022   CHOLHDL 4.0 06/20/2022     3. Hyperkalemia Denies any muscle cramping Lab Results  Component Value Date   K 4.4 06/20/2022     4. Hypercalcemia Lab Results  Component Value Date   CALCIUM  9.7 06/20/2022   PHOS 3.0 12/06/2021   PHOS 3.0 12/06/2021     5. GAD (generalized anxiety disorder) Has been on lexapro  for several years.         6. Age related osteoporosis, unspecified pathological fracture presence Last dexascan was done on 02/03/21. Her t score was -3.2. she does no weight bearing exercise.   7. Vitamin D  insufficiency Is on daily vitamin d  supplement Last vitamin D  Lab Results  Component Value Date   VD25OH 52.65 12/02/2021     8. Underweight Weight is down 4 lbs. She drinks ensure 3x a day and eats 3 meals a day.  Wt Readings from Last 3 Encounters:  06/19/23 84 lb (38.1 kg)  12/29/22 88 lb (39.9 kg)  12/20/22 88 lb (39.9 kg)   BMI Readings from Last 3 Encounters:  06/19/23 15.36 kg/m  12/29/22 16.10 kg/m  12/20/22 16.10 kg/m       New complaints: None today    No Known Allergies Outpatient Encounter Medications as of 06/19/2023  Medication Sig   aspirin  EC 81 MG tablet Take 81 mg by mouth daily.   atorvastatin  (LIPITOR) 80 MG tablet Take 1 tablet (80 mg total) by mouth daily.   Cholecalciferol (VITAMIN D ) 2000 UNITS tablet Take 4000IU on saturdays and sundays and 2000IU all other days. (Patient taking differently: Take 2,000 Units by mouth See admin instructions. Take 1 tablet (2000 units) on Mondays through Fridays and take 2 tablets (4000 units) by mouth on Saturdays & Sundays.)   cyanocobalamin  1000 MCG tablet Take 1 tablet (1,000 mcg total) by mouth daily.   ENSURE PLUS (ENSURE PLUS) LIQD Take 1 Can by mouth daily. Dx:  R63.6 (underweight)   escitalopram  (LEXAPRO ) 10 MG tablet Take 1 tablet (10 mg total) by mouth daily.   ferrous sulfate  325 (65 FE) MG EC tablet Take 1 tablet (325 mg total) by mouth 2 (two) times daily.   senna-docusate (SENOKOT-S) 8.6-50 MG tablet Take 1 tablet by mouth 2 (two) times daily between meals as needed for moderate constipation.   No facility-administered encounter medications on file as of 06/19/2023.    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 Adan Holms: 5cm circumferential fungating tumor at the hepatic flexure, path tubulovillous adenoma.    COLONOSCOPY  01/02/2006   Dr. 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 Multiple polyps around anastomosis of different sizes,    COLONOSCOPY  02/21/2006   Dr. Adan Holms. Referred to Dr. Eli Grizzle for resection   COLONOSCOPY N/A 07/18/2014   Procedure: COLONOSCOPY;  Surgeon:  Suzette Espy, MD;  Location: [facility];  Service: Endoscopy;  Laterality: N/A;  1230   COLONOSCOPY WITH PROPOFOL  N/A 04/22/2020   Procedure: COLONOSCOPY WITH PROPOFOL ;  Surgeon: Ruby Corporal, MD;  Location: [facility];  Service: Endoscopy;  Laterality: N/A;  am   EYE SURGERY     FEMORAL HERNIA REPAIR Right 12/01/2021   Procedure: HERNIA REPAIR FEMORAL, McVay Repair;  Surgeon: Awilda Bogus, MD;  Location: [facility];  Service: General;  Laterality: Right;   LAPAROSCOPIC LYSIS OF ADHESIONS     PARTIAL COLECTOMY  02/21/2005   due to tubulovillous adenoma   TUBAL LIGATION      Family History  Problem Relation Age of Onset   Diabetes Mother    Heart disease Mother    Cancer Mother        patient unsure of type   Hip fracture Mother    Heart disease Father        antigioplasty   Cancer Sister        lung   COPD Sister    Heart disease Sister    Atrial fibrillation Sister    Heart disease Sister        stent in left leg   Varicose Veins Sister    Deep vein thrombosis Sister    Heart disease Brother    COPD Brother    COPD Brother    Huntington's disease Daughter    Carpal tunnel syndrome Daughter    Brain cancer Son    Colon cancer Neg Hx       Controlled substance contract: n/a     Review of Systems  Constitutional:  Negative for diaphoresis.  Eyes:  Negative for pain.  Respiratory:  Negative for shortness of breath.   Cardiovascular:  Negative for chest pain, palpitations and leg swelling.  Gastrointestinal:  Negative for abdominal pain.  Endocrine: Negative for polydipsia.  Skin:  Negative for rash.  Neurological:  Negative for dizziness, weakness and headaches.  Hematological:  Does not bruise/bleed easily.  All other systems reviewed and are negative.      Objective:   Physical Exam Vitals and nursing note reviewed.  Constitutional:      General: She is not in acute distress.    Appearance: Normal appearance. She is well-developed.  HENT:      Head: Normocephalic.     Right Ear: Tympanic membrane normal.     Left Ear: Tympanic membrane normal.     Nose: Nose normal.     Mouth/Throat:     Mouth: Mucous membranes are moist.  Eyes:     Pupils: Pupils are equal, round, and reactive to light.  Neck:     Vascular: No carotid bruit or JVD.  Cardiovascular:     Rate and Rhythm: Normal rate and regular rhythm.  Heart sounds: Normal heart sounds.  Pulmonary:     Effort: Pulmonary effort is normal. No respiratory distress.     Breath sounds: Normal breath sounds. No wheezing or rales.  Chest:     Chest wall: No tenderness.  Abdominal:     General: Bowel sounds are normal. There is no distension or abdominal bruit.     Palpations: Abdomen is soft. There is no hepatomegaly, splenomegaly, mass or pulsatile mass.     Tenderness: There is no abdominal tenderness.  Musculoskeletal:        General: Normal range of motion.     Cervical back: Normal range of motion and neck supple.  Lymphadenopathy:     Cervical: No cervical adenopathy.  Skin:    General: Skin is warm and dry.  Neurological:     Mental Status: She is alert and oriented to person, place, and time.     Deep Tendon Reflexes: Reflexes are normal and symmetric.  Psychiatric:        Behavior: Behavior normal.        Thought Content: Thought content normal.        Judgment: Judgment normal.    BP 139/80   Pulse 90   Temp 98 F (36.7 C) (Temporal)   Ht 5\' 2"  (1.575 m)   Wt 84 lb (38.1 kg)   SpO2 99%   BMI 15.36 kg/m          Assessment & Plan:   Robin [name] comes in today with chief complaint of No chief complaint on file.   Diagnosis and orders addressed:  1. Essential hypertension Low sodium diet - CBC with Differential/Platelet - CMP14+EGFR  2. Mixed hyperlipidemia Low fat diet - atorvastatin  (LIPITOR) 80 MG tablet; Take 1 tablet (80 mg total) by mouth daily.  Dispense: 90 tablet; Refill: 1 - Lipid panel  3. Hyperkalemia Labs  pending  4. Hypercalcemia Labs pending  5. GAD (generalized anxiety disorder) Stress management - escitalopram  (LEXAPRO ) 10 MG tablet; Take 1 tablet (10 mg total) by mouth daily.  Dispense: 90 tablet; Refill: 1  6. Age related osteoporosis, unspecified pathological fracture presence Weight bearing exercise  7. Vitamin D  insufficiency Continue vitamin d  supplement  8. Underweight Continue ensure daily- says getting expensive- can drink chocolate milk in mornings and eat ice cream at night if that is cheaper- need extra calories     Labs pending Health Maintenance reviewed Diet and exercise encouraged  Follow up plan: 6 months   Mary-Margaret Gaylyn Keas, FNP

## 2023-06-19 NOTE — Patient Instructions (Signed)
 Fall Prevention in the Home, Adult Falls can cause injuries and can happen to people of all ages. There are many things you can do to make your home safer and to help prevent falls. What actions can I take to prevent falls? General information Use good lighting in all rooms. Make sure to: Replace any light bulbs that burn out. Turn on the lights in dark areas and use night-lights. Keep items that you use often in easy-to-reach places. Lower the shelves around your home if needed. Move furniture so that there are clear paths around it. Do not use throw rugs or other things on the floor that can make you trip. If any of your floors are uneven, fix them. Add color or contrast paint or tape to clearly mark and help you see: Grab bars or handrails. First and last steps of staircases. Where the edge of each step is. If you use a ladder or stepladder: Make sure that it is fully opened. Do not climb a closed ladder. Make sure the sides of the ladder are locked in place. Have someone hold the ladder while you use it. Know where your pets are as you move through your home. What can I do in the bathroom?     Keep the floor dry. Clean up any water on the floor right away. Remove soap buildup in the bathtub or shower. Buildup makes bathtubs and showers slippery. Use non-skid mats or decals on the floor of the bathtub or shower. Attach bath mats securely with double-sided, non-slip rug tape. If you need to sit down in the shower, use a non-slip stool. Install grab bars by the toilet and in the bathtub and shower. Do not use towel bars as grab bars. What can I do in the bedroom? Make sure that you have a light by your bed that is easy to reach. Do not use any sheets or blankets on your bed that hang to the floor. Have a firm chair or bench with side arms that you can use for support when you get dressed. What can I do in the kitchen? Clean up any spills right away. If you need to reach something  above you, use a step stool with a grab bar. Keep electrical cords out of the way. Do not use floor polish or wax that makes floors slippery. What can I do with my stairs? Do not leave anything on the stairs. Make sure that you have a light switch at the top and the bottom of the stairs. Make sure that there are handrails on both sides of the stairs. Fix handrails that are broken or loose. Install non-slip stair treads on all your stairs if they do not have carpet. Avoid having throw rugs at the top or bottom of the stairs. Choose a carpet that does not hide the edge of the steps on the stairs. Make sure that the carpet is firmly attached to the stairs. Fix carpet that is loose or worn. What can I do on the outside of my home? Use bright outdoor lighting. Fix the edges of walkways and driveways and fix any cracks. Clear paths of anything that can make you trip, such as tools or rocks. Add color or contrast paint or tape to clearly mark and help you see anything that might make you trip as you walk through a door, such as a raised step or threshold. Trim any bushes or trees on paths to your home. Check to see if handrails are loose  or broken and that both sides of all steps have handrails. Install guardrails along the edges of any raised decks and porches. Have leaves, snow, or ice cleared regularly. Use sand, salt, or ice melter on paths if you live where there is ice and snow during the winter. Clean up any spills in your garage right away. This includes grease or oil spills. What other actions can I take? Review your medicines with your doctor. Some medicines can cause dizziness or changes in blood pressure, which increase your risk of falling. Wear shoes that: Have a low heel. Do not wear high heels. Have rubber bottoms and are closed at the toe. Feel good on your feet and fit well. Use tools that help you move around if needed. These include: Canes. Walkers. Scooters. Crutches. Ask  your doctor what else you can do to help prevent falls. This may include seeing a physical therapist to learn to do exercises to move better and get stronger. Where to find more information Centers for Disease Control and Prevention, STEADI: TonerPromos.no [employer] on Aging: BaseRingTones.pl National Institute on Aging: BaseRingTones.pl Contact a doctor if: You are afraid of falling at home. You feel weak, drowsy, or dizzy at home. You fall at home. Get help right away if you: Lose consciousness or have trouble moving after a fall. Have a fall that causes a head injury. These symptoms may be an emergency. Get help right away. Call 911. Do not wait to see if the symptoms will go away. Do not drive yourself to the hospital. This information is not intended to replace advice given to you by your health care provider. Make sure you discuss any questions you have with your health care provider. Document Revised: 10/11/2021 Document Reviewed: 10/11/2021 Elsevier Patient Education  2024 [employer].

## 2023-06-22 DIAGNOSIS — M810 Age-related osteoporosis without current pathological fracture: Secondary | ICD-10-CM | POA: Diagnosis not present

## 2023-06-22 DIAGNOSIS — Z780 Asymptomatic menopausal state: Secondary | ICD-10-CM | POA: Diagnosis not present

## 2023-06-28 ENCOUNTER — Telehealth: Payer: Self-pay | Admitting: Pharmacist

## 2023-06-28 DIAGNOSIS — M810 Age-related osteoporosis without current pathological fracture: Secondary | ICD-10-CM

## 2023-06-28 NOTE — Telephone Encounter (Signed)
 Age related osteoporosis, unspecified pathological fracture presence Last dexascan was done 02/04/19. tscore was -3.3. does no weight bearing exercises. Is not on fosamax , but doe snot want to take at this time.

## 2023-06-30 ENCOUNTER — Telehealth: Payer: Self-pay

## 2023-06-30 NOTE — Progress Notes (Signed)
 Care Guide Pharmacy Note  06/30/2023 Name: Robin [name] MRN: [id number] DOB: 23-Nov-1940  Referred By: Delfina Feller, FNP Reason for referral: Complex Care Management (Outreach to schedule with Pharm d )   Robin [name] is a 83 y.o. year old female who is a primary care patient of Delfina Feller, FNP.  Robin [name] was referred to the pharmacist for assistance related to: osteoporosis  Successful contact was made with the patient to discuss pharmacy services including being ready for the pharmacist to call at least 5 minutes before the scheduled appointment time and to have medication bottles and any blood pressure readings ready for review. The patient agreed to meet with the pharmacist via telephone visit on (date/time).07/26/2023  Lenton Rail , RMA     [place]  [facility], [facility] Guide  Direct Dial: [phone]  Website: [email or web address]

## 2023-07-03 DIAGNOSIS — G5602 Carpal tunnel syndrome, left upper limb: Secondary | ICD-10-CM | POA: Diagnosis not present

## 2023-07-26 ENCOUNTER — Other Ambulatory Visit

## 2023-07-26 NOTE — Progress Notes (Deleted)
   07/26/2023 Name: Robin [name] MRN: [id number] DOB: Jun 17, 1940  No chief complaint on file.   Robin [name] is a 83 y.o. year old female who presented for a telephone visit.   They were referred to the pharmacist by their PCP for assistance in managing osteoporosis .   Care Team: Primary Care Provider: Delfina Feller, FNP ; Next Scheduled Visit: 12/19/2023  Medication Access/Adherence  Current Pharmacy:  [facility] [address] [place] - [address] [place] [address] Phone: [phone] Fax: [phone]   Patient reports affordability concerns with their medications: {YES/NO:21197} Patient reports access/transportation concerns to their pharmacy: No  Patient reports adherence concerns with their medications:  No    Subjective:  Osteoporosis:  Current medications: Prolia  (last injection 06/08/23) Medications tried in the past:   Current supplements:   Current physical activity: ***  Most recent DEXA: 06/19/2023   LUMBAR SPINE (L1-L2):   BMD (in g/cm2): 0.852   T-score: -2.7   Z-score: 0.1   Rate of change from previous exam: No significant rate of change from previous exam.   LEFT FEMORAL NECK:   BMD (in g/cm2): 0.674   T-score: -2.6   Z-score: 0.2   LEFT TOTAL HIP:   BMD (in g/cm2): 0.597   T-score: -3.3   Z-score: -0.5   RIGHT FEMORAL NECK:   BMD (in g/cm2): 0.641   T-score: -2.9   Z-score: -0.1   RIGHT TOTAL HIP:   BMD (in g/cm2): 0.654   T-score: -2.8   Z-score: -0.1   DUAL-FEMUR TOTAL MEAN:   Rate of change from previous exam: No significant rate of change from previous exam.   RIGHT FOREARM (RADIUS 33%):   BMD (in g/cm2): 0.482   T-score: -4.6   Z-score: -1.7  Current medication access support: [employer]HC Dual Complete  Objective:  No results found for: "HGBA1C"  Lab Results  Component Value Date   CREATININE 0.97 06/20/2022   BUN 9 06/20/2022   NA 145 (H) 06/20/2022   K  4.4 06/20/2022   CL 105 06/20/2022   CO2 23 06/20/2022    Lab Results  Component Value Date   CHOL 241 (H) 06/20/2022   HDL 60 06/20/2022   LDLCALC 165 (H) 06/20/2022   TRIG 93 06/20/2022   CHOLHDL 4.0 06/20/2022    Medications Reviewed Today   Medications were not reviewed in this encounter       Assessment/Plan:   {Pharmacy A/P Choices:26421}  Follow Up Plan: ***  Georga Killings, PharmD PGY-1 Pharmacy Resident

## 2023-08-11 DIAGNOSIS — G5603 Carpal tunnel syndrome, bilateral upper limbs: Secondary | ICD-10-CM | POA: Diagnosis not present

## 2023-08-13 DIAGNOSIS — M48061 Spinal stenosis, lumbar region without neurogenic claudication: Secondary | ICD-10-CM | POA: Diagnosis not present

## 2023-08-13 DIAGNOSIS — S2222XA Fracture of body of sternum, initial encounter for closed fracture: Secondary | ICD-10-CM | POA: Diagnosis not present

## 2023-08-13 DIAGNOSIS — R0789 Other chest pain: Secondary | ICD-10-CM | POA: Diagnosis not present

## 2023-08-13 DIAGNOSIS — R071 Chest pain on breathing: Secondary | ICD-10-CM | POA: Diagnosis not present

## 2023-08-13 DIAGNOSIS — Z7982 Long term (current) use of aspirin: Secondary | ICD-10-CM | POA: Diagnosis not present

## 2023-08-13 DIAGNOSIS — S51812A Laceration without foreign body of left forearm, initial encounter: Secondary | ICD-10-CM | POA: Diagnosis not present

## 2023-08-13 DIAGNOSIS — Z79899 Other long term (current) drug therapy: Secondary | ICD-10-CM | POA: Diagnosis not present

## 2023-08-13 DIAGNOSIS — Z9049 Acquired absence of other specified parts of digestive tract: Secondary | ICD-10-CM | POA: Diagnosis not present

## 2023-08-13 DIAGNOSIS — E785 Hyperlipidemia, unspecified: Secondary | ICD-10-CM | POA: Diagnosis not present

## 2023-08-13 DIAGNOSIS — R0689 Other abnormalities of breathing: Secondary | ICD-10-CM | POA: Diagnosis not present

## 2023-08-13 DIAGNOSIS — K769 Liver disease, unspecified: Secondary | ICD-10-CM | POA: Diagnosis not present

## 2023-08-13 DIAGNOSIS — S61412A Laceration without foreign body of left hand, initial encounter: Secondary | ICD-10-CM | POA: Diagnosis not present

## 2023-08-13 DIAGNOSIS — S51811A Laceration without foreign body of right forearm, initial encounter: Secondary | ICD-10-CM | POA: Diagnosis not present

## 2023-08-13 DIAGNOSIS — I4891 Unspecified atrial fibrillation: Secondary | ICD-10-CM | POA: Diagnosis not present

## 2023-08-13 DIAGNOSIS — I10 Essential (primary) hypertension: Secondary | ICD-10-CM | POA: Diagnosis not present

## 2023-08-13 DIAGNOSIS — R079 Chest pain, unspecified: Secondary | ICD-10-CM | POA: Diagnosis not present

## 2023-08-13 DIAGNOSIS — M4807 Spinal stenosis, lumbosacral region: Secondary | ICD-10-CM | POA: Diagnosis not present

## 2023-08-13 DIAGNOSIS — M47816 Spondylosis without myelopathy or radiculopathy, lumbar region: Secondary | ICD-10-CM | POA: Diagnosis not present

## 2023-08-14 DIAGNOSIS — S0990XA Unspecified injury of head, initial encounter: Secondary | ICD-10-CM | POA: Diagnosis not present

## 2023-08-14 DIAGNOSIS — S2220XA Unspecified fracture of sternum, initial encounter for closed fracture: Secondary | ICD-10-CM | POA: Diagnosis not present

## 2023-08-14 DIAGNOSIS — S2222XA Fracture of body of sternum, initial encounter for closed fracture: Secondary | ICD-10-CM | POA: Diagnosis not present

## 2023-08-14 DIAGNOSIS — M542 Cervicalgia: Secondary | ICD-10-CM | POA: Diagnosis not present

## 2023-08-14 DIAGNOSIS — S301XXA Contusion of abdominal wall, initial encounter: Secondary | ICD-10-CM | POA: Diagnosis not present

## 2023-08-14 DIAGNOSIS — M545 Low back pain, unspecified: Secondary | ICD-10-CM | POA: Diagnosis not present

## 2023-08-14 DIAGNOSIS — M79642 Pain in left hand: Secondary | ICD-10-CM | POA: Diagnosis not present

## 2023-08-14 DIAGNOSIS — T1490XA Injury, unspecified, initial encounter: Secondary | ICD-10-CM | POA: Diagnosis not present

## 2023-08-14 DIAGNOSIS — S2242XA Multiple fractures of ribs, left side, initial encounter for closed fracture: Secondary | ICD-10-CM | POA: Diagnosis not present

## 2023-08-14 DIAGNOSIS — M79601 Pain in right arm: Secondary | ICD-10-CM | POA: Diagnosis not present

## 2023-08-15 DIAGNOSIS — T1490XA Injury, unspecified, initial encounter: Secondary | ICD-10-CM | POA: Diagnosis not present

## 2023-09-07 DIAGNOSIS — S2220XD Unspecified fracture of sternum, subsequent encounter for fracture with routine healing: Secondary | ICD-10-CM | POA: Diagnosis not present

## 2023-09-25 ENCOUNTER — Ambulatory Visit: Admitting: Nurse Practitioner

## 2023-09-25 ENCOUNTER — Encounter: Payer: Self-pay | Admitting: Nurse Practitioner

## 2023-09-25 VITALS — BP 139/70 | HR 58 | Temp 97.6°F | Ht 62.0 in | Wt 87.0 lb

## 2023-09-25 DIAGNOSIS — S2222XD Fracture of body of sternum, subsequent encounter for fracture with routine healing: Secondary | ICD-10-CM

## 2023-09-25 DIAGNOSIS — E782 Mixed hyperlipidemia: Secondary | ICD-10-CM

## 2023-09-25 DIAGNOSIS — S2222XS Fracture of body of sternum, sequela: Secondary | ICD-10-CM | POA: Diagnosis not present

## 2023-09-25 MED ORDER — ATORVASTATIN CALCIUM 80 MG PO TABS: 80.0000 mg | ORAL_TABLET | Freq: Every day | ORAL | 1 refills | Status: DC
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
## 2023-09-26 DIAGNOSIS — H905 Unspecified sensorineural hearing loss: Secondary | ICD-10-CM | POA: Diagnosis not present

## 2023-10-02 DIAGNOSIS — G5603 Carpal tunnel syndrome, bilateral upper limbs: Secondary | ICD-10-CM | POA: Diagnosis not present

## 2023-10-09 ENCOUNTER — Telehealth: Payer: Self-pay | Admitting: Family Medicine

## 2023-10-09 DIAGNOSIS — E782 Mixed hyperlipidemia: Secondary | ICD-10-CM

## 2023-10-09 NOTE — Telephone Encounter (Signed)
 Copied from CRM #[id number]. Topic: Clinical - Prescription Issue >> Oct 09, 2023  9:31 AM Graeme ORN wrote: Reason for CRM: Patient called. Request to speak to [facility] Nurse. Wants her to fax Rx for blood pressure medicine to [facility]. Not sure what the name is. Thank You

## 2023-10-09 NOTE — Telephone Encounter (Signed)
 Order was signed and faxed.

## 2023-10-09 NOTE — Telephone Encounter (Signed)
 On desk to sign

## 2023-10-11 ENCOUNTER — Telehealth: Payer: Self-pay

## 2023-10-11 NOTE — Telephone Encounter (Signed)
 Contacted patient. She says she needs her lipitor refilled 90 day supply.

## 2023-10-11 NOTE — Telephone Encounter (Signed)
 Copied from CRM #[id number]. Topic: Clinical - Medication Question >> Oct 11, 2023 10:28 AM Myrick T wrote: Reason for CRM: patient called to get her blood pressure medication refilled but did not have the name. She stated it started with lip but she was not sure. Please f/u with patient as she does not have an old pill bottle

## 2023-10-12 ENCOUNTER — Other Ambulatory Visit: Payer: Self-pay | Admitting: Nurse Practitioner

## 2023-10-12 DIAGNOSIS — E782 Mixed hyperlipidemia: Secondary | ICD-10-CM

## 2023-10-12 NOTE — Telephone Encounter (Unsigned)
 Copied from CRM [id number]. Topic: Clinical - Medication Refill >> Oct 12, 2023  3:18 PM Carlatta H wrote: Medication: atorvastatin  (LIPITOR) 80 MG tablet  Has the patient contacted their pharmacy? Yes (Agent: If no, request that the patient contact the pharmacy for the refill. If patient does not wish to contact the pharmacy document the reason why and proceed with request.) (Agent: If yes, when and what did the pharmacy advise?)Advised to contact office  This is the patient's preferred pharmacy:  [id number] - [place], [place] - [address] [place] [address] Phone: [phone] Fax: [phone]  Is this the correct pharmacy for this prescription? Yes If no, delete pharmacy and type the correct one.   Has the prescription been filled recently? No  Is the patient out of the medication? Yes  Has the patient been seen for an appointment in the last year OR does the patient have an upcoming appointment? No  Can we respond through MyChart? No  Agent: Please be advised that Rx refills may take up to 3 business days. We ask that you follow-up with your pharmacy.

## 2023-10-13 MED ORDER — ATORVASTATIN CALCIUM 80 MG PO TABS
80.0000 mg | ORAL_TABLET | Freq: Every day | ORAL | 0 refills | Status: DC
Start: 2023-10-13 — End: 2023-12-21

## 2023-10-13 NOTE — Telephone Encounter (Signed)
 Prescription sent to pharmacy.

## 2023-10-18 DIAGNOSIS — I10 Essential (primary) hypertension: Secondary | ICD-10-CM | POA: Diagnosis not present

## 2023-10-18 DIAGNOSIS — I4891 Unspecified atrial fibrillation: Secondary | ICD-10-CM | POA: Diagnosis not present

## 2023-10-18 DIAGNOSIS — G5600 Carpal tunnel syndrome, unspecified upper limb: Secondary | ICD-10-CM | POA: Diagnosis not present

## 2023-10-18 DIAGNOSIS — G5603 Carpal tunnel syndrome, bilateral upper limbs: Secondary | ICD-10-CM | POA: Diagnosis not present

## 2023-10-25 ENCOUNTER — Telehealth: Payer: Self-pay | Admitting: Family Medicine

## 2023-10-25 NOTE — Telephone Encounter (Signed)
 Copied from CRM #[id number]. Topic: Clinical - Medication Question >> Oct 24, 2023  4:38 PM Robin [name] wrote: Reason for CRM: patient states she has been without her blood pressure medication for two months and she needs a refill, patient not sure the name of the medication   Pt num [phone] (H)

## 2023-10-25 NOTE — Telephone Encounter (Signed)
 Patient states that she has been on lisinopril  10mg  and has been out of it for 2 months.  States she has been trying to call and get it filled.  Reviewed last note and medication list and I do not see where patient is supposed to be on it.  Has up coming appointment with MMM on 12/19/23.  Checked her BP 10/23/23 and it was 147/75 & 1315/65.  Patient was not able to provide any other BP's.  Aware you are out of the office and she will get a call back tomorrow

## 2023-10-26 ENCOUNTER — Telehealth: Payer: Self-pay

## 2023-10-26 DIAGNOSIS — M810 Age-related osteoporosis without current pathological fracture: Secondary | ICD-10-CM

## 2023-10-26 MED ORDER — DENOSUMAB 60 MG/ML ~~LOC~~ SOSY
60.0000 mg | PREFILLED_SYRINGE | Freq: Once | SUBCUTANEOUS | Status: AC
Start: 2023-12-03 — End: 2023-12-21
  Administered 2023-12-21: 60 mg via SUBCUTANEOUS

## 2023-10-26 NOTE — Telephone Encounter (Signed)
 Prolia  sent for benefit verification

## 2023-10-26 NOTE — Telephone Encounter (Signed)
 Spoke with patients son. Has not had lisinopril  since 2023. Need to keep diary of blood pressure and let me know what it is before we add back lisinopril . BP Readings from Last 3 Encounters:  09/25/23 139/70  06/19/23 139/80  12/20/22 139/81

## 2023-10-27 ENCOUNTER — Telehealth: Payer: Self-pay

## 2023-10-27 NOTE — Telephone Encounter (Signed)
 Prolia  VOB initiated via [email or web address]  Next Prolia  inj DUE: 12/08/23

## 2023-10-31 ENCOUNTER — Other Ambulatory Visit (HOSPITAL_COMMUNITY): Payer: Self-pay

## 2023-10-31 NOTE — Telephone Encounter (Signed)
 Robin [name]

## 2023-10-31 NOTE — Telephone Encounter (Signed)
 Pt ready for scheduling for PROLIA  on or after : 12/08/23  Option# 1: Buy/Bill (Office supplied medication)  Out-of-pocket cost due at time of clinic visit: $0  Number of injection/visits approved: 2  Primary: UHC-MEDICARE DUAL COMPLETE Prolia  co-insurance: 0% Admin fee co-insurance: 0%  Secondary:  MEDICAID Prolia  co-insurance:  Admin fee co-insurance:   Medical Benefit Details: Date Benefits were checked: 10/31/23 Deductible: $257 Met of $257 Required/ Coinsurance: 0%/ Admin Fee: 0%  Prior Auth: APPROVED PA# [id number]  Expiration Date: 10/31/23-10/30/24  # of doses approved: 2 ----------------------------------------------------------------------- Option# 2- Med Obtained from pharmacy:  Pharmacy benefit: Copay $--- (Paid to pharmacy) Admin Fee: --- (Pay at clinic)  Prior Auth: --- PA# Expiration Date:   # of doses approved:   If patient wants fill through the pharmacy benefit please send prescription to: ---, and include estimated need by date in rx notes. Pharmacy will ship medication directly to the office.  Patient NOT eligible for Prolia  Copay Card. Copay Card can make patient's cost as little as $25. Link to apply: [email or web address]://[email or web address]  ** This summary of benefits is an estimation of the patient's out-of-pocket cost. Exact cost may very based on individual plan coverage.

## 2023-10-31 NOTE — Telephone Encounter (Signed)
 PA#: [id number] 10/31/23-10/30/24

## 2023-12-19 ENCOUNTER — Encounter: Payer: Self-pay | Admitting: Nurse Practitioner

## 2023-12-19 ENCOUNTER — Ambulatory Visit: Admitting: Nurse Practitioner

## 2023-12-21 ENCOUNTER — Ambulatory Visit: Admitting: Nurse Practitioner

## 2023-12-21 ENCOUNTER — Encounter: Payer: Self-pay | Admitting: Nurse Practitioner

## 2023-12-21 VITALS — BP 143/80 | HR 64 | Temp 97.5°F | Ht 62.0 in | Wt 92.0 lb

## 2023-12-21 DIAGNOSIS — Z23 Encounter for immunization: Secondary | ICD-10-CM | POA: Diagnosis not present

## 2023-12-21 DIAGNOSIS — I10 Essential (primary) hypertension: Secondary | ICD-10-CM

## 2023-12-21 DIAGNOSIS — M810 Age-related osteoporosis without current pathological fracture: Secondary | ICD-10-CM

## 2023-12-21 DIAGNOSIS — F411 Generalized anxiety disorder: Secondary | ICD-10-CM

## 2023-12-21 DIAGNOSIS — E782 Mixed hyperlipidemia: Secondary | ICD-10-CM | POA: Diagnosis not present

## 2023-12-21 DIAGNOSIS — E875 Hyperkalemia: Secondary | ICD-10-CM

## 2023-12-21 DIAGNOSIS — E559 Vitamin D deficiency, unspecified: Secondary | ICD-10-CM

## 2023-12-21 DIAGNOSIS — R636 Underweight: Secondary | ICD-10-CM

## 2023-12-21 MED ORDER — ATORVASTATIN CALCIUM 80 MG PO TABS
80.0000 mg | ORAL_TABLET | Freq: Every day | ORAL | 1 refills | Status: AC
Start: 2023-12-21 — End: ?

## 2023-12-21 MED ORDER — ESCITALOPRAM OXALATE 10 MG PO TABS
10.0000 mg | ORAL_TABLET | Freq: Every day | ORAL | 1 refills | Status: AC
Start: 2023-12-21 — End: ?

## 2023-12-21 NOTE — Progress Notes (Signed)
 Subjective:    Patient ID: Rendell [name] Blush, female    DOB: [birth year], 83 y.o.   MRN: [id number]   Chief Complaint: annual physical    HPI:  GAGE TREIBER is a 83 y.o. who identifies as a female who was assigned female at birth.   Social history: Lives with: daughter Work history: retired   Water Engineer in today for follow up of the following chronic medical issues:  1. Essential hypertension No c/o chest pain, sob or headache. Does not check blood pressure at home. BP Readings from Last 3 Encounters:  09/25/23 139/70  06/19/23 139/80  12/20/22 139/81     2. Mixed hyperlipidemia Does not watch diet and does no dedicated exercise. Takes lipitor daily- may change to crestor if still high at next visit Lab Results  Component Value Date   CHOL 241 (H) 06/20/2022   HDL 60 06/20/2022   LDLCALC 165 (H) 06/20/2022   TRIG 93 06/20/2022   CHOLHDL 4.0 06/20/2022     3. Hyperkalemia Denies any muscle cramping Lab Results  Component Value Date   K 4.4 06/20/2022     4. Hypercalcemia Lab Results  Component Value Date   CALCIUM  9.7 06/20/2022   PHOS 3.0 12/06/2021   PHOS 3.0 12/06/2021     5. GAD (generalized anxiety disorder) Has been on lexapro  for several years.     12/21/2023   10:47 AM 09/25/2023    2:59 PM 06/19/2023    2:15 PM 12/20/2022   10:06 AM  GAD 7 : Generalized Anxiety Score  Nervous, Anxious, on Edge 0 0 0 0  Control/stop worrying 0 0 0 0  Worry too much - different things 0 0 0 0  Trouble relaxing 0 0 0 0  Restless 0 0 0 0  Easily annoyed or irritable 0 0 0 0  Afraid - awful might happen 0 0 0 0  Total GAD 7 Score 0 0 0 0  Anxiety Difficulty Not difficult at all Not difficult at all Not difficult at all Not difficult at all       12/21/2023   10:47 AM 09/25/2023    2:59 PM 06/19/2023    2:14 PM  Depression screen PHQ 2/9  Decreased Interest 0 0 0  Down, Depressed, Hopeless 0 0 0  PHQ - 2 Score 0 0 0      6. Age related osteoporosis,  unspecified pathological fracture presence Last dexascan was done on 06/19/23. Her t score was -4.6. she does no weight bearing exercise. She is currently on prolia  but insurance wants her to change to something elese the first of the year.   7. Vitamin D  insufficiency Is on daily vitamin d  supplement Last vitamin D  Lab Results  Component Value Date   VD25OH 52.65 12/02/2021     8. Underweight Weight is up 3 lbs. She drinks ensure 3x a day and eats 3 meals a day.  Wt Readings from Last 3 Encounters:  09/25/23 87 lb (39.5 kg)  06/19/23 84 lb (38.1 kg)  12/29/22 88 lb (39.9 kg)   BMI Readings from Last 3 Encounters:  09/25/23 15.91 kg/m  06/19/23 15.36 kg/m  12/29/22 16.10 kg/m       New complaints: None today   No Known Allergies Outpatient Encounter Medications as of 12/21/2023  Medication Sig   aspirin  EC 81 MG tablet Take 81 mg by mouth daily.   atorvastatin  (LIPITOR) 80 MG tablet Take 1 tablet (80 mg total) by mouth daily.  Cholecalciferol (VITAMIN D ) 2000 UNITS tablet Take 4000IU on saturdays and sundays and 2000IU all other days. (Patient taking differently: Take 2,000 Units by mouth See admin instructions. Take 1 tablet (2000 units) on Mondays through Fridays and take 2 tablets (4000 units) by mouth on Saturdays & Sundays.)   cyanocobalamin  1000 MCG tablet Take 1 tablet (1,000 mcg total) by mouth daily.   ENSURE PLUS (ENSURE PLUS) LIQD Take 1 Can by mouth daily. Dx:  R63.6 (underweight)   escitalopram  (LEXAPRO ) 10 MG tablet Take 1 tablet (10 mg total) by mouth daily.   ferrous sulfate  325 (65 FE) MG EC tablet Take 1 tablet (325 mg total) by mouth 2 (two) times daily.   senna-docusate (SENOKOT-S) 8.6-50 MG tablet Take 1 tablet by mouth 2 (two) times daily between meals as needed for moderate constipation.   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 Jakie: 5cm circumferential fungating tumor at the hepatic flexure, path tubulovillous adenoma.    COLONOSCOPY  01/02/2006   Dr. 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 Multiple polyps around anastomosis of different sizes,    COLONOSCOPY  02/21/2006   Dr. Jakie. Referred to Dr. Belinda for resection   COLONOSCOPY N/A 07/18/2014   Procedure: COLONOSCOPY;  Surgeon: Lamar [name] Hollingshead, MD;  Location: [facility];  Service: Endoscopy;  Laterality: N/A;  1230   COLONOSCOPY WITH PROPOFOL  N/A 04/22/2020   Procedure: COLONOSCOPY WITH PROPOFOL ;  Surgeon: Golda Claudis PENNER, MD;  Location: [facility];  Service: Endoscopy;  Laterality: N/A;  am   EYE SURGERY     FEMORAL HERNIA REPAIR Right 12/01/2021   Procedure: HERNIA REPAIR FEMORAL, McVay Repair;  Surgeon: Kallie Manuelita BROCKS, MD;  Location: [facility];  Service: General;  Laterality: Right;   LAPAROSCOPIC LYSIS OF ADHESIONS     PARTIAL COLECTOMY  02/21/2005   due to tubulovillous adenoma   TUBAL LIGATION      Family History  Problem Relation Age of Onset   Diabetes Mother    Heart disease Mother    Cancer Mother        patient unsure of type   Hip fracture Mother    Heart disease Father        antigioplasty   Cancer Sister        lung   COPD Sister    Heart disease Sister    Atrial fibrillation Sister    Heart disease Sister        stent in left leg   Varicose Veins Sister    Deep vein thrombosis Sister    Heart disease Brother    COPD Brother    COPD Brother    Huntington's disease Daughter    Carpal tunnel syndrome Daughter     Brain cancer Son    Colon cancer Neg Hx       Controlled substance contract: n/a     Review of Systems  Constitutional:  Negative for diaphoresis.  Eyes:  Negative for pain.  Respiratory:  Negative for shortness  of breath.   Cardiovascular:  Negative for chest pain, palpitations and leg swelling.  Gastrointestinal:  Negative for abdominal pain.  Endocrine: Negative for polydipsia.  Skin:  Negative for rash.  Neurological:  Negative for dizziness, weakness and headaches.  Hematological:  Does not bruise/bleed easily.  All other systems reviewed and are negative.      Objective:   Physical Exam Vitals and nursing note reviewed.  Constitutional:      General: She is not in acute distress.    Appearance: Normal appearance. She is well-developed.  HENT:     Head: Normocephalic.     Right Ear: Tympanic membrane normal.     Left Ear: Tympanic membrane normal.     Nose: Nose normal.     Mouth/Throat:     Mouth: Mucous membranes are moist.  Eyes:     Pupils: Pupils are equal, round, and reactive to light.  Neck:     Vascular: No carotid bruit or JVD.  Cardiovascular:     Rate and Rhythm: Normal rate and regular rhythm.     Heart sounds: Normal heart sounds.  Pulmonary:     Effort: Pulmonary effort is normal. No respiratory distress.     Breath sounds: Normal breath sounds. No wheezing or rales.  Chest:     Chest wall: No tenderness.  Abdominal:     General: Bowel sounds are normal. There is no distension or abdominal bruit.     Palpations: Abdomen is soft. There is no hepatomegaly, splenomegaly, mass or pulsatile mass.     Tenderness: There is no abdominal tenderness.  Musculoskeletal:        General: Normal range of motion.     Cervical back: Normal range of motion and neck supple.  Lymphadenopathy:     Cervical: No cervical adenopathy.  Skin:    General: Skin is warm and dry.  Neurological:     Mental Status: She is alert and oriented to person, place, and  time.     Deep Tendon Reflexes: Reflexes are normal and symmetric.  Psychiatric:        Behavior: Behavior normal.        Thought Content: Thought content normal.        Judgment: Judgment normal.    BP (!) 143/80   Pulse 64   Temp (!) 97.5 F (36.4 C) (Temporal)   Ht 5' 2 (1.575 m)   Wt 92 lb (41.7 kg)   SpO2 97%   BMI 16.83 kg/m           Assessment & Plan:   SHARAI OVERBAY comes in today with chief complaint of annual physical   Diagnosis and orders addressed:  1. Essential hypertension Low sodium diet - CBC with Differential/Platelet - CMP14+EGFR  2. Mixed hyperlipidemia Low fat diet - atorvastatin  (LIPITOR) 80 MG tablet; Take 1 tablet (80 mg total) by mouth daily.  Dispense: 90 tablet; Refill: 1 - Lipid panel  3. Hyperkalemia Labs pending  4. Hypercalcemia Labs pending  5. GAD (generalized anxiety disorder) Stress management - escitalopram  (LEXAPRO ) 10 MG tablet; Take 1 tablet (10 mg total) by mouth daily.  Dispense: 90 tablet; Refill: 1  6. Age related osteoporosis, unspecified pathological fracture presence Weight bearing exercise  7. Vitamin D  insufficiency Continue vitamin d  supplement  8. Underweight Continue ensure daily- says getting expensive- can drink chocolate milk in mornings and eat ice cream at night if that is cheaper- need extra calories     Labs pending Health  Maintenance reviewed Diet and exercise encouraged  Follow up plan: 6 months   Mary-Margaret Gladis, FNP

## 2023-12-21 NOTE — Patient Instructions (Signed)
 Fall Prevention in the Home, Adult Falls can cause injuries and can happen to people of all ages. There are many things you can do to make your home safer and to help prevent falls. What actions can I take to prevent falls? General information Use good lighting in all rooms. Make sure to: Replace any light bulbs that burn out. Turn on the lights in dark areas and use night-lights. Keep items that you use often in easy-to-reach places. Lower the shelves around your home if needed. Move furniture so that there are clear paths around it. Do not use throw rugs or other things on the floor that can make you trip. If any of your floors are uneven, fix them. Add color or contrast paint or tape to clearly mark and help you see: Grab bars or handrails. First and last steps of staircases. Where the edge of each step is. If you use a ladder or stepladder: Make sure that it is fully opened. Do not climb a closed ladder. Make sure the sides of the ladder are locked in place. Have someone hold the ladder while you use it. Know where your pets are as you move through your home. What can I do in the bathroom?     Keep the floor dry. Clean up any water on the floor right away. Remove soap buildup in the bathtub or shower. Buildup makes bathtubs and showers slippery. Use non-skid mats or decals on the floor of the bathtub or shower. Attach bath mats securely with double-sided, non-slip rug tape. If you need to sit down in the shower, use a non-slip stool. Install grab bars by the toilet and in the bathtub and shower. Do not use towel bars as grab bars. What can I do in the bedroom? Make sure that you have a light by your bed that is easy to reach. Do not use any sheets or blankets on your bed that hang to the floor. Have a firm chair or bench with side arms that you can use for support when you get dressed. What can I do in the kitchen? Clean up any spills right away. If you need to reach something  above you, use a step stool with a grab bar. Keep electrical cords out of the way. Do not use floor polish or wax that makes floors slippery. What can I do with my stairs? Do not leave anything on the stairs. Make sure that you have a light switch at the top and the bottom of the stairs. Make sure that there are handrails on both sides of the stairs. Fix handrails that are broken or loose. Install non-slip stair treads on all your stairs if they do not have carpet. Avoid having throw rugs at the top or bottom of the stairs. Choose a carpet that does not hide the edge of the steps on the stairs. Make sure that the carpet is firmly attached to the stairs. Fix carpet that is loose or worn. What can I do on the outside of my home? Use bright outdoor lighting. Fix the edges of walkways and driveways and fix any cracks. Clear paths of anything that can make you trip, such as tools or rocks. Add color or contrast paint or tape to clearly mark and help you see anything that might make you trip as you walk through a door, such as a raised step or threshold. Trim any bushes or trees on paths to your home. Check to see if handrails are loose  or broken and that both sides of all steps have handrails. Install guardrails along the edges of any raised decks and porches. Have leaves, snow, or ice cleared regularly. Use sand, salt, or ice melter on paths if you live where there is ice and snow during the winter. Clean up any spills in your garage right away. This includes grease or oil spills. What other actions can I take? Review your medicines with your doctor. Some medicines can cause dizziness or changes in blood pressure, which increase your risk of falling. Wear shoes that: Have a low heel. Do not wear high heels. Have rubber bottoms and are closed at the toe. Feel good on your feet and fit well. Use tools that help you move around if needed. These include: Canes. Walkers. Scooters. Crutches. Ask  your doctor what else you can do to help prevent falls. This may include seeing a physical therapist to learn to do exercises to move better and get stronger. Where to find more information Centers for Disease Control and Prevention, STEADI: TonerPromos.no [employer] on Aging: BaseRingTones.pl National Institute on Aging: BaseRingTones.pl Contact a doctor if: You are afraid of falling at home. You feel weak, drowsy, or dizzy at home. You fall at home. Get help right away if you: Lose consciousness or have trouble moving after a fall. Have a fall that causes a head injury. These symptoms may be an emergency. Get help right away. Call 911. Do not wait to see if the symptoms will go away. Do not drive yourself to the hospital. This information is not intended to replace advice given to you by your health care provider. Make sure you discuss any questions you have with your health care provider. Document Revised: 10/11/2021 Document Reviewed: 10/11/2021 Elsevier Patient Education  2024 [employer].

## 2023-12-22 ENCOUNTER — Ambulatory Visit: Payer: Self-pay | Admitting: Nurse Practitioner

## 2023-12-22 LAB — CBC WITH DIFFERENTIAL/PLATELET
Basophils Absolute: 0 x10E3/uL (ref 0.0–0.2)
Basos: 1 %
EOS (ABSOLUTE): 0.1 x10E3/uL (ref 0.0–0.4)
Eos: 2 %
Hematocrit: 34 % (ref 34.0–46.6)
Hemoglobin: 10.9 g/dL — ABNORMAL LOW (ref 11.1–15.9)
Immature Grans (Abs): 0 x10E3/uL (ref 0.0–0.1)
Immature Granulocytes: 0 %
Lymphocytes Absolute: 1.1 x10E3/uL (ref 0.7–3.1)
Lymphs: 28 %
MCH: 29.9 pg (ref 26.6–33.0)
MCHC: 32.1 g/dL (ref 31.5–35.7)
MCV: 93 fL (ref 79–97)
Monocytes Absolute: 0.3 x10E3/uL (ref 0.1–0.9)
Monocytes: 7 %
Neutrophils Absolute: 2.3 x10E3/uL (ref 1.4–7.0)
Neutrophils: 62 %
Platelets: 266 x10E3/uL (ref 150–450)
RBC: 3.65 x10E6/uL — ABNORMAL LOW (ref 3.77–5.28)
RDW: 13 % (ref 11.7–15.4)
WBC: 3.8 x10E3/uL (ref 3.4–10.8)

## 2023-12-22 LAB — CMP14+EGFR
ALT: 11 IU/L (ref 0–32)
AST: 16 IU/L (ref 0–40)
Albumin: 4.1 g/dL (ref 3.7–4.7)
Alkaline Phosphatase: 56 IU/L (ref 48–129)
BUN/Creatinine Ratio: 12 (ref 12–28)
BUN: 14 mg/dL (ref 8–27)
Bilirubin Total: 0.4 mg/dL (ref 0.0–1.2)
CO2: 25 mmol/L (ref 20–29)
Calcium: 9.9 mg/dL (ref 8.7–10.3)
Chloride: 102 mmol/L (ref 96–106)
Creatinine, Ser: 1.19 mg/dL — ABNORMAL HIGH (ref 0.57–1.00)
Globulin, Total: 2.4 g/dL (ref 1.5–4.5)
Glucose: 86 mg/dL (ref 70–99)
Potassium: 4.4 mmol/L (ref 3.5–5.2)
Sodium: 140 mmol/L (ref 134–144)
Total Protein: 6.5 g/dL (ref 6.0–8.5)
eGFR: 46 mL/min/1.73 — ABNORMAL LOW (ref >59)

## 2023-12-22 LAB — LIPID PANEL
Chol/HDL Ratio: 4.9 ratio — ABNORMAL HIGH (ref 0.0–4.4)
Cholesterol, Total: 274 mg/dL — ABNORMAL HIGH (ref 100–199)
HDL: 56 mg/dL (ref >39)
LDL Chol Calc (NIH): 198 mg/dL — ABNORMAL HIGH (ref 0–99)
Triglycerides: 111 mg/dL (ref 0–149)
VLDL Cholesterol Cal: 20 mg/dL (ref 5–40)

## 2024-01-01 ENCOUNTER — Ambulatory Visit: Payer: 59

## 2024-01-01 VITALS — BP 143/50 | HR 64 | Ht 62.0 in | Wt 92.0 lb

## 2024-01-01 DIAGNOSIS — Z0000 Encounter for general adult medical examination without abnormal findings: Secondary | ICD-10-CM | POA: Diagnosis not present

## 2024-01-01 DIAGNOSIS — Z1231 Encounter for screening mammogram for malignant neoplasm of breast: Secondary | ICD-10-CM

## 2024-01-01 NOTE — Progress Notes (Signed)
 Subjective:   Robin [name] is a 83 y.o. female who presents for a Medicare Annual Wellness Visit. I connected with  Mena M Collier on 01/01/24 by a audio enabled telemedicine application and verified that I am speaking with the correct person using two identifiers.  Patient Location: Home  Provider Location: Office/Clinic  Persons Participating in Visit: Patient.  I discussed the limitations of evaluation and management by telemedicine. The patient expressed understanding and agreed to proceed.   Vital Signs: Because this visit was a virtual/telehealth visit, some criteria may be missing or patient reported. Any vitals not documented were not able to be obtained and vitals that have been documented are patient reported.   If you're able to add these things as option in the [employer], we won't need it ... but for those who refuse to use the template, I guess it does need to be updated     Allergies (verified) Patient has no known allergies.   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 Jakie: 5cm circumferential fungating tumor at the hepatic flexure, path tubulovillous adenoma.    COLONOSCOPY  01/02/2006   Dr. 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 Multiple polyps around anastomosis of  different sizes,    COLONOSCOPY  02/21/2006   Dr. Jakie. Referred to Dr. Belinda for resection   COLONOSCOPY N/A 07/18/2014   Procedure: COLONOSCOPY;  Surgeon: Lamar [name] Hollingshead, MD;  Location: [facility];  Service: Endoscopy;  Laterality: N/A;  1230   COLONOSCOPY WITH PROPOFOL  N/A 04/22/2020   Procedure: COLONOSCOPY WITH PROPOFOL ;  Surgeon: Golda Claudis PENNER, MD;  Location: [facility];  Service: Endoscopy;  Laterality: N/A;  am   EYE SURGERY     FEMORAL HERNIA REPAIR Right 12/01/2021   Procedure: HERNIA REPAIR FEMORAL, McVay Repair;  Surgeon: Kallie Manuelita BROCKS, MD;  Location: [facility];  Service: General;  Laterality: Right;   LAPAROSCOPIC LYSIS OF ADHESIONS     PARTIAL COLECTOMY  02/21/2005   due to tubulovillous adenoma   TUBAL LIGATION     Family History  Problem Relation Age of Onset   Diabetes Mother    Heart disease Mother    Cancer Mother        patient unsure of type   Hip fracture Mother    Heart disease Father        antigioplasty   Cancer Sister        lung   COPD Sister    Heart disease Sister    Atrial fibrillation Sister    Heart disease Sister        stent in left leg   Varicose Veins Sister    Deep vein thrombosis Sister    Heart disease Brother    COPD Brother  COPD Brother    Huntington's disease Daughter    Carpal tunnel syndrome Daughter    Brain cancer Son    Colon cancer Neg Hx    Social History   Occupational History   Occupation: retired  Tobacco Use   Smoking status: Former    Current packs/day: 0.00    Types: Cigarettes    Start date: 11/01/1958    Quit date: 11/01/1959    Years since quitting: 64.2   Smokeless tobacco: Never  Vaping Use   Vaping status: Never Used  Substance and Sexual Activity   Alcohol use: No    Alcohol/week: 0.0 standard drinks of alcohol   Drug use: No   Sexual activity: Never   Tobacco Counseling Counseling given: Yes  SDOH Screenings   Food Insecurity: Low Risk  (08/14/2023)   Received from [facility]  Housing: Low Risk  (08/14/2023)   Received from [facility]  Transportation Needs: No Transportation Needs (08/14/2023)   Received from [facility]  Utilities: Low Risk  (08/14/2023)   Received from [facility]  Alcohol Screen: Low Risk  (12/29/2022)  Depression (PHQ2-9): Low Risk  (12/21/2023)  Financial Resource Strain: Low Risk  (12/29/2022)  Physical Activity: Inactive (12/29/2022)  Social Connections: Moderately Isolated (12/29/2022)  Stress: No Stress Concern Present (12/29/2022)  Tobacco Use: Medium Risk (01/01/2024)  Health Literacy: Adequate Health Literacy (12/29/2022)   Depression Screen    12/21/2023   10:47 AM 09/25/2023    2:59 PM 06/19/2023    2:14 PM 12/29/2022    9:04 AM 12/20/2022   10:06 AM 06/20/2022    1:55 PM 12/27/2021   11:38 AM  PHQ 2/9 Scores  PHQ - 2 Score 0 0 0 0 0 0 0  PHQ- 9 Score    0  0  0  0      Data saved with a previous flowsheet row definition      Goals Addressed             This Visit's Progress    DIET - EAT MORE FRUITS AND VEGETABLES   On track      Visit info / Clinical Intake: Medicare Wellness Visit Type:: Subsequent Annual Wellness Visit Persons participating in visit:: patient Medicare Wellness Visit Mode:: Telephone If telephone:: video declined Because this visit was a virtual/telehealth visit:: vitals recorded from last visit If Telephone or Video please confirm:: I connected with the patient using audio enabled telemedicine application and verified that I am speaking with the correct person using two identifiers Patient Location:: home Provider Location:: office Information given by:: patient Interpreter Needed?: No Pre-visit prep was completed: yes AWV questionnaire completed by patient prior to visit?: no Living arrangements:: with family/others Patient's Overall Health Status Rating: very good Typical amount of pain: none Does pain affect daily life?: no Are you currently prescribed opioids?:  no  Dietary Habits and Nutritional Risks How many meals a day?: 3 Eats fruit and vegetables daily?: (!) no Most meals are obtained by: preparing own meals In the last 2 weeks, have you had any of the following?: (!) nausea, vomiting, diarrhea Diabetic:: no  Functional Status Activities of Daily Living (to include ambulation/medication): Independent Ambulation: Independent with device- listed below Home Assistive Devices/Equipment: Walker (specify Type) Medication Administration: Independent Home Management: Independent Manage your own finances?: yes Primary transportation is: driving Concerns about hearing?: no  Fall Screening Falls in the past year?: 0 Number of falls in past year: 0 Was there an injury with Fall?:  0 Fall Risk Category Calculator: 0 Patient Fall Risk Level: Low Fall Risk  Fall Risk Patient at Risk for Falls Due to: No Fall Risks Fall risk Follow up: Falls evaluation completed; Education provided  Home and Transportation Safety: All rugs have non-skid backing?: yes All stairs or steps have railings?: (!) no Grab bars in the bathtub or shower?: yes Have non-skid surface in bathtub or shower?: yes Good home lighting?: yes Regular seat belt use?: yes Hospital stays in the last year:: no  Cognitive Assessment Difficulty concentrating, remembering, or making decisions? : no Will 6CIT or Mini Cog be Completed: yes What year is it?: 0 points What month is it?: 0 points Give patient an address phrase to remember (5 components): [address] [place], [place] About what time is it?: 0 points Count backwards from 20 to 1: 0 points Say the months of the year in reverse: 0 points Repeat the address phrase from earlier: 0 points 6 CIT Score: 0 points  Reviewed/Updated  Reviewed/Updated: Reviewed All (Medical, Surgical, Family, Medications, Allergies, Care Teams, Patient Goals)        Objective:    Today's Vitals   01/01/24 1132  BP: (!) 143/50  Pulse: 64   Weight: 92 lb (41.7 kg)  Height: 5' 2 (1.575 m)   Body mass index is 16.83 kg/m.  Current Medications (verified) Outpatient Encounter Medications as of 01/01/2024  Medication Sig   aspirin  EC 81 MG tablet Take 81 mg by mouth daily.   atorvastatin  (LIPITOR) 80 MG tablet Take 1 tablet (80 mg total) by mouth daily.   Cholecalciferol (VITAMIN D ) 2000 UNITS tablet Take 4000IU on saturdays and sundays and 2000IU all other days. (Patient taking differently: Take 2,000 Units by mouth See admin instructions. Take 1 tablet (2000 units) on Mondays through Fridays and take 2 tablets (4000 units) by mouth on Saturdays & Sundays.)   cyanocobalamin  1000 MCG tablet Take 1 tablet (1,000 mcg total) by mouth daily.   ENSURE PLUS (ENSURE PLUS) LIQD Take 1 Can by mouth daily. Dx:  R63.6 (underweight)   escitalopram  (LEXAPRO ) 10 MG tablet Take 1 tablet (10 mg total) by mouth daily.   ferrous sulfate  325 (65 FE) MG EC tablet Take 1 tablet (325 mg total) by mouth 2 (two) times daily.   senna-docusate (SENOKOT-S) 8.6-50 MG tablet Take 1 tablet by mouth 2 (two) times daily between meals as needed for moderate constipation.   No facility-administered encounter medications on file as of 01/01/2024.   Hearing/Vision screen No results found. Immunizations and Health Maintenance Health Maintenance  Topic Date Due   Zoster Vaccines- Shingrix (1 of 2) 09/20/1959   Mammogram  01/20/2022   COVID-19 Vaccine (3 - Moderna risk series) 01/06/2024 (Originally 02/08/2020)   DTaP/Tdap/Td (2 - Td or Tdap) 05/29/2024   Medicare Annual Wellness (AWV)  12/31/2024   Colonoscopy  04/22/2025   DEXA SCAN  06/21/2025   Pneumococcal Vaccine: 50+ Years  Completed   Influenza Vaccine  Completed   Meningococcal B Vaccine  Aged Out   Hepatitis C Screening  Discontinued        Assessment/Plan:  This is a routine wellness examination for Robin [name].  Patient Care Team: Gladis Mustard, FNP as PCP - General (Nurse  Practitioner) Shaaron Lamar HERO, MD as Consulting Physician (Gastroenterology)  I have personally reviewed and noted the following in the patient's chart:   Medical and social history Use of alcohol, tobacco or illicit drugs  Current medications and supplements including opioid prescriptions. Functional  ability and status Nutritional status Physical activity Advanced directives List of other physicians Hospitalizations, surgeries, and ER visits in previous 12 months Vitals Screenings to include cognitive, depression, and falls Referrals and appointments  No orders of the defined types were placed in this encounter.  In addition, I have reviewed and discussed with patient certain preventive protocols, quality metrics, and best practice recommendations. A written personalized care plan for preventive services as well as general preventive health recommendations were provided to patient.   Ozie Ned, CMA   01/01/2024   Return in 1 year (on 12/31/2024).  After Visit Summary: (MyChart) Due to this being a telephonic visit, the after visit summary with patients personalized plan was offered to patient via MyChart   Nurse Notes: n/a  I have reviewed and agree with the above AWV documentation.   Mary-Margaret Gladis, FNP

## 2024-03-29 ENCOUNTER — Telehealth: Payer: Self-pay | Admitting: Nurse Practitioner

## 2024-03-29 NOTE — Telephone Encounter (Signed)
 REF# [id number] - Needs information of Prolia  shot given in October.

## 2024-03-29 NOTE — Telephone Encounter (Signed)
 Called no answer did not get that this was an insurance line it seemed kinda of inappropriate did not leave message not sure if this was a legit call.

## 2024-06-18 ENCOUNTER — Ambulatory Visit: Admitting: Nurse Practitioner

## 2025-01-02 ENCOUNTER — Ambulatory Visit
